# Patient Record
Sex: Female | Born: 1965 | Race: White | Hispanic: No | Marital: Single | Smoking: Current every day smoker
Health system: Southern US, Community
[De-identification: ages and names within clinical notes are randomized; demographics above are authoritative.]

## PROBLEM LIST (undated history)

## (undated) DIAGNOSIS — I2699 Other pulmonary embolism without acute cor pulmonale: Secondary | ICD-10-CM

## (undated) DIAGNOSIS — IMO0002 Reserved for concepts with insufficient information to code with codable children: Secondary | ICD-10-CM

## (undated) DIAGNOSIS — F319 Bipolar disorder, unspecified: Secondary | ICD-10-CM

## (undated) DIAGNOSIS — F329 Major depressive disorder, single episode, unspecified: Secondary | ICD-10-CM

## (undated) DIAGNOSIS — F419 Anxiety disorder, unspecified: Secondary | ICD-10-CM

## (undated) DIAGNOSIS — F1910 Other psychoactive substance abuse, uncomplicated: Secondary | ICD-10-CM

## (undated) DIAGNOSIS — M869 Osteomyelitis, unspecified: Secondary | ICD-10-CM

## (undated) DIAGNOSIS — G8929 Other chronic pain: Secondary | ICD-10-CM

## (undated) DIAGNOSIS — G709 Myoneural disorder, unspecified: Secondary | ICD-10-CM

## (undated) DIAGNOSIS — M329 Systemic lupus erythematosus, unspecified: Secondary | ICD-10-CM

## (undated) DIAGNOSIS — I10 Essential (primary) hypertension: Secondary | ICD-10-CM

## (undated) DIAGNOSIS — M1990 Unspecified osteoarthritis, unspecified site: Secondary | ICD-10-CM

## (undated) DIAGNOSIS — F32A Depression, unspecified: Secondary | ICD-10-CM

## (undated) DIAGNOSIS — R569 Unspecified convulsions: Secondary | ICD-10-CM

## (undated) DIAGNOSIS — E119 Type 2 diabetes mellitus without complications: Secondary | ICD-10-CM

## (undated) HISTORY — DX: Essential (primary) hypertension: I10

## (undated) HISTORY — DX: Systemic lupus erythematosus, unspecified: M32.9

## (undated) HISTORY — DX: Other pulmonary embolism without acute cor pulmonale: I26.99

## (undated) HISTORY — PX: OTHER SURGICAL HISTORY: SHX169

---

## 2005-12-20 ENCOUNTER — Inpatient Hospital Stay (HOSPITAL_COMMUNITY): Admission: EM | Admit: 2005-12-20 | Discharge: 2006-01-11 | Payer: Self-pay | Admitting: Emergency Medicine

## 2005-12-27 ENCOUNTER — Ambulatory Visit: Payer: Self-pay | Admitting: Critical Care Medicine

## 2006-01-04 ENCOUNTER — Ambulatory Visit: Payer: Self-pay | Admitting: Oncology

## 2006-01-13 ENCOUNTER — Ambulatory Visit (HOSPITAL_COMMUNITY): Admission: RE | Admit: 2006-01-13 | Discharge: 2006-01-13 | Payer: Self-pay | Admitting: Internal Medicine

## 2006-01-13 ENCOUNTER — Ambulatory Visit: Payer: Self-pay | Admitting: Internal Medicine

## 2006-01-17 ENCOUNTER — Ambulatory Visit: Payer: Self-pay | Admitting: Internal Medicine

## 2006-01-23 ENCOUNTER — Ambulatory Visit: Payer: Self-pay | Admitting: Internal Medicine

## 2006-01-27 ENCOUNTER — Ambulatory Visit: Payer: Self-pay | Admitting: Internal Medicine

## 2006-02-06 ENCOUNTER — Ambulatory Visit: Payer: Self-pay | Admitting: Internal Medicine

## 2006-02-06 ENCOUNTER — Ambulatory Visit: Payer: Self-pay | Admitting: *Deleted

## 2006-02-23 ENCOUNTER — Ambulatory Visit: Payer: Self-pay | Admitting: Internal Medicine

## 2006-03-09 ENCOUNTER — Ambulatory Visit: Payer: Self-pay | Admitting: Internal Medicine

## 2006-03-13 ENCOUNTER — Ambulatory Visit (HOSPITAL_COMMUNITY): Admission: RE | Admit: 2006-03-13 | Discharge: 2006-03-13 | Payer: Self-pay | Admitting: Internal Medicine

## 2006-03-24 ENCOUNTER — Ambulatory Visit: Payer: Self-pay | Admitting: Internal Medicine

## 2006-04-14 ENCOUNTER — Ambulatory Visit: Payer: Self-pay | Admitting: Internal Medicine

## 2006-05-19 ENCOUNTER — Ambulatory Visit: Payer: Self-pay | Admitting: Internal Medicine

## 2006-06-08 ENCOUNTER — Ambulatory Visit: Payer: Self-pay | Admitting: Family Medicine

## 2006-06-23 ENCOUNTER — Ambulatory Visit: Payer: Self-pay | Admitting: Internal Medicine

## 2006-06-30 ENCOUNTER — Ambulatory Visit (HOSPITAL_COMMUNITY): Admission: RE | Admit: 2006-06-30 | Discharge: 2006-06-30 | Payer: Self-pay | Admitting: Internal Medicine

## 2006-06-30 ENCOUNTER — Ambulatory Visit: Payer: Self-pay | Admitting: Internal Medicine

## 2006-09-05 ENCOUNTER — Ambulatory Visit: Payer: Self-pay | Admitting: Internal Medicine

## 2006-10-11 ENCOUNTER — Ambulatory Visit: Payer: Self-pay | Admitting: Internal Medicine

## 2006-11-21 DIAGNOSIS — I2699 Other pulmonary embolism without acute cor pulmonale: Secondary | ICD-10-CM

## 2006-11-21 HISTORY — DX: Other pulmonary embolism without acute cor pulmonale: I26.99

## 2006-11-24 ENCOUNTER — Ambulatory Visit: Payer: Self-pay | Admitting: Internal Medicine

## 2007-06-05 ENCOUNTER — Emergency Department (HOSPITAL_COMMUNITY): Admission: EM | Admit: 2007-06-05 | Discharge: 2007-06-05 | Payer: Self-pay | Admitting: Emergency Medicine

## 2007-08-08 ENCOUNTER — Encounter (INDEPENDENT_AMBULATORY_CARE_PROVIDER_SITE_OTHER): Payer: Self-pay | Admitting: *Deleted

## 2007-12-12 ENCOUNTER — Ambulatory Visit: Payer: Self-pay | Admitting: Internal Medicine

## 2007-12-17 ENCOUNTER — Ambulatory Visit: Payer: Self-pay | Admitting: Internal Medicine

## 2007-12-24 ENCOUNTER — Ambulatory Visit: Payer: Self-pay | Admitting: Internal Medicine

## 2007-12-31 ENCOUNTER — Ambulatory Visit: Payer: Self-pay | Admitting: Internal Medicine

## 2008-01-15 ENCOUNTER — Ambulatory Visit: Payer: Self-pay | Admitting: Internal Medicine

## 2008-01-17 ENCOUNTER — Ambulatory Visit: Payer: Self-pay | Admitting: Internal Medicine

## 2008-01-29 ENCOUNTER — Ambulatory Visit: Payer: Self-pay | Admitting: Internal Medicine

## 2008-02-12 ENCOUNTER — Ambulatory Visit: Payer: Self-pay | Admitting: Internal Medicine

## 2008-03-19 ENCOUNTER — Ambulatory Visit: Payer: Self-pay | Admitting: Internal Medicine

## 2010-04-02 ENCOUNTER — Encounter: Admission: RE | Admit: 2010-04-02 | Discharge: 2010-04-02 | Payer: Self-pay | Admitting: Orthopedic Surgery

## 2010-05-13 ENCOUNTER — Ambulatory Visit: Payer: Self-pay | Admitting: Oncology

## 2010-06-08 LAB — CBC WITH DIFFERENTIAL/PLATELET
BASO%: 0.5 % (ref 0.0–2.0)
Basophils Absolute: 0 10*3/uL (ref 0.0–0.1)
EOS%: 0.5 % (ref 0.0–7.0)
Eosinophils Absolute: 0 10*3/uL (ref 0.0–0.5)
HCT: 41.1 % (ref 34.8–46.6)
HGB: 14.4 g/dL (ref 11.6–15.9)
LYMPH%: 26.7 % (ref 14.0–49.7)
MCH: 29.2 pg (ref 25.1–34.0)
MCHC: 35 g/dL (ref 31.5–36.0)
MCV: 83.3 fL (ref 79.5–101.0)
MONO#: 0.9 10*3/uL (ref 0.1–0.9)
MONO%: 9.5 % (ref 0.0–14.0)
NEUT#: 6.1 10*3/uL (ref 1.5–6.5)
NEUT%: 62.8 % (ref 38.4–76.8)
Platelets: 284 10*3/uL (ref 145–400)
RBC: 4.93 10*6/uL (ref 3.70–5.45)
RDW: 16.7 % — ABNORMAL HIGH (ref 11.2–14.5)
WBC: 9.7 10*3/uL (ref 3.9–10.3)
lymph#: 2.6 10*3/uL (ref 0.9–3.3)

## 2010-06-08 LAB — APTT: aPTT: 38 seconds — ABNORMAL HIGH (ref 24–37)

## 2010-06-08 LAB — PROTHROMBIN TIME
INR: 1.83 — ABNORMAL HIGH (ref <1.50)
Prothrombin Time: 21 seconds — ABNORMAL HIGH (ref 11.6–15.2)

## 2010-06-14 LAB — COMPREHENSIVE METABOLIC PANEL
ALT: 20 U/L (ref 0–35)
AST: 17 U/L (ref 0–37)
Albumin: 4.1 g/dL (ref 3.5–5.2)
Alkaline Phosphatase: 75 U/L (ref 39–117)
BUN: 4 mg/dL — ABNORMAL LOW (ref 6–23)
CO2: 19 mEq/L (ref 19–32)
Calcium: 9.4 mg/dL (ref 8.4–10.5)
Chloride: 107 mEq/L (ref 96–112)
Creatinine, Ser: 0.93 mg/dL (ref 0.40–1.20)
Glucose, Bld: 89 mg/dL (ref 70–99)
Potassium: 4.4 mEq/L (ref 3.5–5.3)
Sodium: 141 mEq/L (ref 135–145)
Total Bilirubin: 0.4 mg/dL (ref 0.3–1.2)
Total Protein: 6.8 g/dL (ref 6.0–8.3)

## 2010-06-14 LAB — HYPERCOAGULABLE PANEL, COMPREHENSIVE
AntiThromb III Func: 108 % (ref 76–126)
Anticardiolipin IgA: 15 APL U/mL (ref <22)
Anticardiolipin IgG: 24 GPL U/mL — ABNORMAL HIGH (ref <23)
Anticardiolipin IgM: 18 MPL U/mL — ABNORMAL HIGH (ref <11)
Beta-2 Glyco I IgG: 79 G Units — ABNORMAL HIGH (ref <20)
Beta-2-Glycoprotein I IgA: 188 A Units — ABNORMAL HIGH (ref <20)
Beta-2-Glycoprotein I IgM: 13 M Units (ref <20)
DRVVT 1:1 Mix: 59.6 secs — ABNORMAL HIGH (ref 36.2–44.3)
DRVVT: 80.2 secs — ABNORMAL HIGH (ref 36.2–44.3)
Drvvt confirmation: 1.71 Ratio — ABNORMAL HIGH (ref <1.21)
Homocysteine: 8.8 umol/L (ref 4.0–15.4)
Lupus Anticoagulant: DETECTED — AB
PTT Lupus Anticoagulant: 45.8 secs — ABNORMAL HIGH (ref 30.0–45.6)
PTTLA 4:1 Mix: 46.4 secs — ABNORMAL HIGH (ref 30.0–45.6)
PTTLA Confirmation: 17.9 secs — ABNORMAL HIGH (ref <8.0)
Protein C Activity: 24 % — ABNORMAL LOW (ref 75–133)
Protein C, Total: 71 % (ref 70–140)
Protein S Activity: 34 % — ABNORMAL LOW (ref 69–129)
Protein S Ag, Total: 67 % — ABNORMAL LOW (ref 70–140)

## 2010-06-14 LAB — LACTATE DEHYDROGENASE: LDH: 172 U/L (ref 94–250)

## 2010-06-14 LAB — D-DIMER, QUANTITATIVE: D-Dimer, Quant: 0.22 ug/mL-FEU (ref 0.00–0.48)

## 2010-06-19 LAB — HEXAGONAL PHOSPHOLIPID NEUTRALIZATION: Hex Phosph Neut Test: POSITIVE — AB

## 2010-06-23 ENCOUNTER — Ambulatory Visit: Admission: RE | Admit: 2010-06-23 | Discharge: 2010-06-23 | Payer: Self-pay | Admitting: Oncology

## 2010-06-23 ENCOUNTER — Encounter (HOSPITAL_COMMUNITY): Payer: Self-pay | Admitting: Oncology

## 2010-06-23 ENCOUNTER — Ambulatory Visit: Payer: Self-pay | Admitting: Surgery

## 2010-06-24 ENCOUNTER — Ambulatory Visit: Payer: Self-pay | Admitting: Oncology

## 2010-08-12 ENCOUNTER — Ambulatory Visit: Payer: Self-pay | Admitting: Oncology

## 2010-08-16 LAB — PROTIME-INR
INR: 1.6 — ABNORMAL LOW (ref 2.00–3.50)
Protime: 19.2 Seconds — ABNORMAL HIGH (ref 10.6–13.4)

## 2010-08-23 LAB — PROTIME-INR
INR: 1.8 — ABNORMAL LOW (ref 2.00–3.50)
Protime: 21.6 Seconds — ABNORMAL HIGH (ref 10.6–13.4)

## 2010-09-06 ENCOUNTER — Inpatient Hospital Stay (HOSPITAL_COMMUNITY): Admission: AD | Admit: 2010-09-06 | Discharge: 2010-09-10 | Payer: Self-pay | Admitting: Orthopedic Surgery

## 2010-10-21 ENCOUNTER — Ambulatory Visit: Payer: Self-pay | Admitting: Oncology

## 2010-11-25 ENCOUNTER — Ambulatory Visit: Payer: Self-pay | Admitting: Oncology

## 2010-11-26 LAB — PROTIME-INR
INR: 3.5 (ref 2.00–3.50)
Protime: 42 Seconds — ABNORMAL HIGH (ref 10.6–13.4)

## 2010-12-06 LAB — PROTIME-INR
INR: 3.5 (ref 2.00–3.50)
Protime: 42 Seconds — ABNORMAL HIGH (ref 10.6–13.4)

## 2010-12-20 LAB — PROTIME-INR
INR: 1.9 — ABNORMAL LOW (ref 2.00–3.50)
Protime: 22.8 Seconds — ABNORMAL HIGH (ref 10.6–13.4)

## 2010-12-23 NOTE — Miscellaneous (Signed)
Summary: VIP  Patient: Crystal Frank Note: All result statuses are Final unless otherwise noted.  Tests: (1) VIP (Medications)   LLIMPORTMEDS              "Result Below..."       RESULT: WARFARIN SODIUM TABS 5 MG*TAKE 3 TABLETS EVERY MONDAY, WEDNESDAY, AND FRIDAY AND TAKE 2-1/2 TABLETS EVERY TUESDAY, THURSDAY, SATURDAY, SUNDAY*11/24/2006*Last Refill: 03/06/2007*[id number]*******   LLIMPORTALLS              NKDA***  Note: An exclamation mark (!) indicates a result that was not dispersed into the flowsheet. Document Creation Date: 09/20/2007 3:04 PM _______________________________________________________________________  (1) Order result status: Final Collection or observation date-time: 08/08/2007 Requested date-time: 08/08/2007 Receipt date-time:  Reported date-time: 08/08/2007 Referring Physician:   Ordering Physician:   Specimen Source:  Source: [facility] [place] Order Number:  Lab site:

## 2011-01-03 ENCOUNTER — Emergency Department (HOSPITAL_COMMUNITY): Payer: Self-pay

## 2011-01-03 ENCOUNTER — Emergency Department (HOSPITAL_COMMUNITY)
Admission: EM | Admit: 2011-01-03 | Discharge: 2011-01-03 | Disposition: A | Discharge status: Other Acute Inpatient Hospital | Payer: Self-pay | Attending: Emergency Medicine | Admitting: Emergency Medicine

## 2011-01-03 DIAGNOSIS — Z7901 Long term (current) use of anticoagulants: Secondary | ICD-10-CM | POA: Insufficient documentation

## 2011-01-03 DIAGNOSIS — R0602 Shortness of breath: Secondary | ICD-10-CM | POA: Insufficient documentation

## 2011-01-03 DIAGNOSIS — J40 Bronchitis, not specified as acute or chronic: Secondary | ICD-10-CM | POA: Insufficient documentation

## 2011-01-03 DIAGNOSIS — R079 Chest pain, unspecified: Secondary | ICD-10-CM | POA: Insufficient documentation

## 2011-01-03 DIAGNOSIS — Z86711 Personal history of pulmonary embolism: Secondary | ICD-10-CM | POA: Insufficient documentation

## 2011-01-03 DIAGNOSIS — M329 Systemic lupus erythematosus, unspecified: Secondary | ICD-10-CM | POA: Insufficient documentation

## 2011-01-03 LAB — POCT CARDIAC MARKERS
CKMB, poc: <1 ng/mL — ABNORMAL LOW (ref 1.0–8.0)
Myoglobin, poc: 38.5 ng/mL (ref 12–200)
Troponin i, poc: <0.05 ng/mL (ref 0.00–0.09)

## 2011-01-03 LAB — CBC
HCT: 42.5 % (ref 36.0–46.0)
Hemoglobin: 14.4 g/dL (ref 12.0–15.0)
MCH: 27.5 pg (ref 26.0–34.0)
MCHC: 33.9 g/dL (ref 30.0–36.0)
MCV: 81.3 fL (ref 78.0–100.0)
Platelets: 263 10*3/uL (ref 150–400)
RBC: 5.23 MIL/uL — ABNORMAL HIGH (ref 3.87–5.11)
RDW: 16.1 % — ABNORMAL HIGH (ref 11.5–15.5)
WBC: 15.6 10*3/uL — ABNORMAL HIGH (ref 4.0–10.5)

## 2011-01-03 LAB — DIFFERENTIAL
Basophils Absolute: 0.1 10*3/uL (ref 0.0–0.1)
Basophils Relative: 0 % (ref 0–1)
Eosinophils Absolute: 0.1 10*3/uL (ref 0.0–0.7)
Eosinophils Relative: 0 % (ref 0–5)
Lymphocytes Relative: 31 % (ref 12–46)
Lymphs Abs: 4.9 10*3/uL — ABNORMAL HIGH (ref 0.7–4.0)
Monocytes Absolute: 1.5 10*3/uL — ABNORMAL HIGH (ref 0.1–1.0)
Monocytes Relative: 10 % (ref 3–12)
Neutro Abs: 9.1 10*3/uL — ABNORMAL HIGH (ref 1.7–7.7)
Neutrophils Relative %: 59 % (ref 43–77)

## 2011-01-03 LAB — POCT I-STAT, CHEM 8
BUN: 6 mg/dL (ref 6–23)
Calcium, Ion: 1.13 mmol/L (ref 1.12–1.32)
Chloride: 107 mEq/L (ref 96–112)
Creatinine, Ser: 1 mg/dL (ref 0.4–1.2)
Glucose, Bld: 91 mg/dL (ref 70–99)
HCT: 43 % (ref 36.0–46.0)
Hemoglobin: 14.6 g/dL (ref 12.0–15.0)
Potassium: 5 mEq/L (ref 3.5–5.1)
Sodium: 141 mEq/L (ref 135–145)
TCO2: 26 mmol/L (ref 0–100)

## 2011-01-03 LAB — PROTIME-INR
INR: 3.01 — ABNORMAL HIGH (ref 0.00–1.49)
Prothrombin Time: 31.3 seconds — ABNORMAL HIGH (ref 11.6–15.2)

## 2011-01-03 MED ORDER — IOHEXOL 300 MG/ML  SOLN
80.0000 mL | Freq: Once | INTRAMUSCULAR | Status: AC | PRN
  Administered 2011-01-03: 80 mL via INTRAVENOUS

## 2011-01-18 ENCOUNTER — Other Ambulatory Visit (HOSPITAL_COMMUNITY): Payer: Self-pay | Admitting: Oncology

## 2011-01-18 ENCOUNTER — Encounter (HOSPITAL_BASED_OUTPATIENT_CLINIC_OR_DEPARTMENT_OTHER): Payer: Self-pay | Admitting: Oncology

## 2011-01-18 DIAGNOSIS — Z7901 Long term (current) use of anticoagulants: Secondary | ICD-10-CM

## 2011-01-18 DIAGNOSIS — D6859 Other primary thrombophilia: Secondary | ICD-10-CM

## 2011-01-18 DIAGNOSIS — Z86718 Personal history of other venous thrombosis and embolism: Secondary | ICD-10-CM

## 2011-01-18 LAB — PROTIME-INR
INR: 2.2 (ref 2.00–3.50)
Protime: 26.4 Seconds — ABNORMAL HIGH (ref 10.6–13.4)

## 2011-02-02 LAB — COMPREHENSIVE METABOLIC PANEL
ALT: 117 U/L — ABNORMAL HIGH (ref 0–35)
ALT: 85 U/L — ABNORMAL HIGH (ref 0–35)
AST: 45 U/L — ABNORMAL HIGH (ref 0–37)
AST: 66 U/L — ABNORMAL HIGH (ref 0–37)
Albumin: 3.4 g/dL — ABNORMAL LOW (ref 3.5–5.2)
Albumin: 3.9 g/dL (ref 3.5–5.2)
Alkaline Phosphatase: 69 U/L (ref 39–117)
Alkaline Phosphatase: 69 U/L (ref 39–117)
BUN: 3 mg/dL — ABNORMAL LOW (ref 6–23)
BUN: 4 mg/dL — ABNORMAL LOW (ref 6–23)
CO2: 25 mEq/L (ref 19–32)
CO2: 31 mEq/L (ref 19–32)
Calcium: 8.8 mg/dL (ref 8.4–10.5)
Calcium: 9 mg/dL (ref 8.4–10.5)
Chloride: 104 mEq/L (ref 96–112)
Chloride: 109 mEq/L (ref 96–112)
Creatinine, Ser: 0.76 mg/dL (ref 0.4–1.2)
Creatinine, Ser: 0.8 mg/dL (ref 0.4–1.2)
GFR calc Af Amer: >60 mL/min (ref >60)
GFR calc Af Amer: >60 mL/min (ref >60)
GFR calc non Af Amer: >60 mL/min (ref >60)
GFR calc non Af Amer: >60 mL/min (ref >60)
Glucose, Bld: 106 mg/dL — ABNORMAL HIGH (ref 70–99)
Glucose, Bld: 78 mg/dL (ref 70–99)
Potassium: 3.4 mEq/L — ABNORMAL LOW (ref 3.5–5.1)
Potassium: 3.9 mEq/L (ref 3.5–5.1)
Sodium: 140 mEq/L (ref 135–145)
Sodium: 141 mEq/L (ref 135–145)
Total Bilirubin: 1 mg/dL (ref 0.3–1.2)
Total Bilirubin: 1.1 mg/dL (ref 0.3–1.2)
Total Protein: 6.1 g/dL (ref 6.0–8.3)
Total Protein: 6.8 g/dL (ref 6.0–8.3)

## 2011-02-02 LAB — CBC
HCT: 34.4 % — ABNORMAL LOW (ref 36.0–46.0)
HCT: 35.5 % — ABNORMAL LOW (ref 36.0–46.0)
HCT: 36.2 % (ref 36.0–46.0)
HCT: 38.1 % (ref 36.0–46.0)
Hemoglobin: 11.5 g/dL — ABNORMAL LOW (ref 12.0–15.0)
Hemoglobin: 11.9 g/dL — ABNORMAL LOW (ref 12.0–15.0)
Hemoglobin: 12.1 g/dL (ref 12.0–15.0)
Hemoglobin: 13 g/dL (ref 12.0–15.0)
MCH: 28.5 pg (ref 26.0–34.0)
MCH: 28.5 pg (ref 26.0–34.0)
MCH: 28.7 pg (ref 26.0–34.0)
MCH: 28.8 pg (ref 26.0–34.0)
MCHC: 33.4 g/dL (ref 30.0–36.0)
MCHC: 33.4 g/dL (ref 30.0–36.0)
MCHC: 33.5 g/dL (ref 30.0–36.0)
MCHC: 34.1 g/dL (ref 30.0–36.0)
MCV: 84.1 fL (ref 78.0–100.0)
MCV: 84.9 fL (ref 78.0–100.0)
MCV: 85.4 fL (ref 78.0–100.0)
MCV: 86.2 fL (ref 78.0–100.0)
Platelets: 233 10*3/uL (ref 150–400)
Platelets: 241 10*3/uL (ref 150–400)
Platelets: 249 10*3/uL (ref 150–400)
Platelets: 250 10*3/uL (ref 150–400)
RBC: 4.03 MIL/uL (ref 3.87–5.11)
RBC: 4.18 MIL/uL (ref 3.87–5.11)
RBC: 4.2 MIL/uL (ref 3.87–5.11)
RBC: 4.53 MIL/uL (ref 3.87–5.11)
RDW: 14.6 % (ref 11.5–15.5)
RDW: 14.7 % (ref 11.5–15.5)
RDW: 14.9 % (ref 11.5–15.5)
RDW: 15.2 % (ref 11.5–15.5)
WBC: 12.3 10*3/uL — ABNORMAL HIGH (ref 4.0–10.5)
WBC: 8.6 10*3/uL (ref 4.0–10.5)
WBC: 8.7 10*3/uL (ref 4.0–10.5)
WBC: 8.8 10*3/uL (ref 4.0–10.5)

## 2011-02-02 LAB — URINALYSIS, ROUTINE W REFLEX MICROSCOPIC
Bilirubin Urine: NEGATIVE
Glucose, UA: NEGATIVE mg/dL
Hgb urine dipstick: NEGATIVE
Ketones, ur: NEGATIVE mg/dL
Nitrite: NEGATIVE
Protein, ur: NEGATIVE mg/dL
Specific Gravity, Urine: 1.024 (ref 1.005–1.030)
Urobilinogen, UA: 1 mg/dL (ref 0.0–1.0)
pH: 5.5 (ref 5.0–8.0)

## 2011-02-02 LAB — DIFFERENTIAL
Basophils Absolute: 0 10*3/uL (ref 0.0–0.1)
Basophils Relative: 1 % (ref 0–1)
Eosinophils Absolute: 0.1 10*3/uL (ref 0.0–0.7)
Eosinophils Relative: 1 % (ref 0–5)
Lymphocytes Relative: 33 % (ref 12–46)
Lymphs Abs: 2.8 10*3/uL (ref 0.7–4.0)
Monocytes Absolute: 0.8 10*3/uL (ref 0.1–1.0)
Monocytes Relative: 10 % (ref 3–12)
Neutro Abs: 4.8 10*3/uL (ref 1.7–7.7)
Neutrophils Relative %: 56 % (ref 43–77)

## 2011-02-02 LAB — URINE MICROSCOPIC-ADD ON

## 2011-02-02 LAB — HEPARIN LEVEL (UNFRACTIONATED)
Heparin Unfractionated: 0.19 IU/mL — ABNORMAL LOW (ref 0.30–0.70)
Heparin Unfractionated: 0.27 IU/mL — ABNORMAL LOW (ref 0.30–0.70)
Heparin Unfractionated: 0.34 IU/mL (ref 0.30–0.70)
Heparin Unfractionated: 0.36 IU/mL (ref 0.30–0.70)
Heparin Unfractionated: 0.41 IU/mL (ref 0.30–0.70)
Heparin Unfractionated: 0.44 IU/mL (ref 0.30–0.70)

## 2011-02-02 LAB — PROTIME-INR
INR: 1.02 (ref 0.00–1.49)
INR: 1.07 (ref 0.00–1.49)
INR: 1.23 (ref 0.00–1.49)
INR: 1.53 — ABNORMAL HIGH (ref 0.00–1.49)
Prothrombin Time: 13.6 seconds (ref 11.6–15.2)
Prothrombin Time: 14.1 seconds (ref 11.6–15.2)
Prothrombin Time: 15.7 seconds — ABNORMAL HIGH (ref 11.6–15.2)
Prothrombin Time: 18.6 seconds — ABNORMAL HIGH (ref 11.6–15.2)

## 2011-02-02 LAB — URINE CULTURE
Colony Count: 35000
Culture  Setup Time: 201110180549

## 2011-02-02 LAB — VITAMIN D 25 HYDROXY (VIT D DEFICIENCY, FRACTURES): Vit D, 25-Hydroxy: 30 ng/mL (ref 30–89)

## 2011-02-02 LAB — CALCIUM, IONIZED: Calcium, Ion: 1.22 mmol/L (ref 1.12–1.32)

## 2011-02-02 LAB — VITAMIN D 1,25 DIHYDROXY
Vitamin D 1, 25 (OH)2 Total: 31 pg/mL (ref 18–72)
Vitamin D2 1, 25 (OH)2: <8 pg/mL
Vitamin D3 1, 25 (OH)2: 31 pg/mL

## 2011-02-02 LAB — PTH, INTACT AND CALCIUM
Calcium, Total (PTH): 8.6 mg/dL (ref 8.4–10.5)
PTH: 20.8 pg/mL (ref 14.0–72.0)

## 2011-02-02 LAB — APTT: aPTT: 45 seconds — ABNORMAL HIGH (ref 24–37)

## 2011-02-17 ENCOUNTER — Encounter (HOSPITAL_BASED_OUTPATIENT_CLINIC_OR_DEPARTMENT_OTHER): Payer: Self-pay | Admitting: Oncology

## 2011-02-17 ENCOUNTER — Other Ambulatory Visit (HOSPITAL_COMMUNITY): Payer: Self-pay | Admitting: Oncology

## 2011-02-17 DIAGNOSIS — Z7901 Long term (current) use of anticoagulants: Secondary | ICD-10-CM

## 2011-02-17 DIAGNOSIS — D6859 Other primary thrombophilia: Secondary | ICD-10-CM

## 2011-02-17 DIAGNOSIS — Z86718 Personal history of other venous thrombosis and embolism: Secondary | ICD-10-CM

## 2011-02-17 LAB — PROTIME-INR
INR: 1.6 — ABNORMAL LOW (ref 2.00–3.50)
Protime: 19.2 Seconds — ABNORMAL HIGH (ref 10.6–13.4)

## 2011-03-15 ENCOUNTER — Other Ambulatory Visit: Payer: Self-pay | Admitting: Orthopedic Surgery

## 2011-03-21 ENCOUNTER — Other Ambulatory Visit: Payer: Self-pay | Admitting: Orthopedic Surgery

## 2011-03-21 ENCOUNTER — Ambulatory Visit
Admission: RE | Admit: 2011-03-21 | Discharge: 2011-03-21 | Disposition: A | Discharge status: Home / Self Care | Payer: Worker's Compensation | Source: Ambulatory Visit | Attending: Orthopedic Surgery | Admitting: Orthopedic Surgery

## 2011-03-21 DIAGNOSIS — M25579 Pain in unspecified ankle and joints of unspecified foot: Secondary | ICD-10-CM

## 2011-03-22 ENCOUNTER — Inpatient Hospital Stay
Admission: RE | Admit: 2011-03-22 | Discharge: 2011-03-22 | Disposition: A | Discharge status: Home / Self Care | Payer: Worker's Compensation | Source: Ambulatory Visit | Attending: Orthopedic Surgery | Admitting: Orthopedic Surgery

## 2011-03-22 ENCOUNTER — Other Ambulatory Visit: Payer: Worker's Compensation

## 2011-03-22 ENCOUNTER — Other Ambulatory Visit: Payer: Self-pay | Admitting: Orthopedic Surgery

## 2011-03-22 ENCOUNTER — Ambulatory Visit
Admission: RE | Admit: 2011-03-22 | Discharge: 2011-03-22 | Disposition: A | Discharge status: Home / Self Care | Payer: Worker's Compensation | Source: Ambulatory Visit | Attending: Orthopedic Surgery | Admitting: Orthopedic Surgery

## 2011-03-22 DIAGNOSIS — M25579 Pain in unspecified ankle and joints of unspecified foot: Secondary | ICD-10-CM

## 2011-04-08 NOTE — H&P (Signed)
NAMESHONTELL, PROSSER               ACCOUNT NO.:  [id number]   MEDICAL RECORD NO.:  [id number]          PATIENT TYPE:  EMS   LOCATION:  ED                           FACILITY:  [facility]   PHYSICIAN:  Hettie Holstein, D.O.    DATE OF BIRTH:  1966-06-01   DATE OF ADMISSION:  12/20/2005  DATE OF DISCHARGE:                                HISTORY & PHYSICAL   PRIMARY CARE PHYSICIAN:  Mosetta Putt, M.D.   CHIEF COMPLAINT:  Chest pain.   HISTORY OF PRESENT ILLNESS:  Mrs. Bricco is a pleasant, relatively healthy 45-  year-old Caucasian female with rather unremarkable past medical history with  the exception of previous history of pleurisy several years ago without  known history of PE.  She had seen a pulmonologist in [place] at that  time.  In any event, about a week ago she developed some pain in her neck on  the rt side.  This had persisted and eventually progressed until Monday with  involvement of the right shoulder and right chest.  She took about four  ibuprofen and this seemed to help initially.  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 this  continued to persist without relief.  She became very short of breath.  She  presented to the emergency department at [facility]. She stated  the pain was quite severe and she underwent CT scanning in the emergency  department which revealed positive right upper lobe pulmonary embolus and  she is currently on a heparin drip.  A hypercoagulable panel has been  ordered subsequently.   PAST MEDICAL HISTORY:  As noted above.  She had been treated for  hypertension though she has been well controlled off medications and she had  pleurisy that was addressed by a pulmonologist in [place].  She denies  any prior history of surgery.   MEDICATIONS:  She takes no routine medications at home.   ALLERGIES:  She  has no known drug allergies.   SOCIAL HISTORY:  She has her own [employer].  She smokes  about one-half pack of cigarettes per day. She denies alcohol.  She is  single, lives alone. She has no children.  She denies illicit drug use.   FAMILY HISTORY:  She has an aunt on her mother's side who had a deep venous  thrombosis.  Her mother died at age 64 with human immunodeficiency virus and  cancer.  Father is alive and well at age 77.   REVIEW OF SYSTEMS:  She has been in her usual state of health with no weight  loss or appetite loss. No nausea, vomiting, diarrhea, no abdominal pain,  swelling.  She does report some occasional discomfort in her left lower  extremity though she states that she does not recall much and has just  dismissed this in the past.   PHYSICAL EXAMINATION:  VITAL SIGNS:  Blood pressure is 135/64, heart rate  92, respiratory rate 18, temperature 97.1, oxygen  saturation 96%.  GENERAL APPEARANCE: The patient is alert, oriented, very pleasant in no  acute distress.  HEENT:  Reveals her head to be normocephalic, atraumatic.  Extraocular  movements intact.  NECK:  Supple, nontender.  No palpable thyromegaly or masses.  CARDIOVASCULAR:  Examination reveals normal S1, S2.  No S3 or S4.  LUNGS:  Reveal diminished sounds, though clear bilaterally.  She exhibits no  excess effort in respirations.  She has no dullness to percussion.  ABDOMEN:  Soft, nontender.  EXTREMITIES:  Lower extremities reveal no calf tenderness.  She exhibits no  edema.  NEUROLOGICAL:  Examination reveals her to be alert, euthymic.  Her affect is  stable.  There are no focal neurological deficits.   LABORATORY DATA:  Sodium 140, potassium 4.0, BUN 8, creatinine 0.8 and her  glucose was 106.  Her D-dimer was 0.98.  Initial point of care markers  negative.  CT scan as mentioned above.   ASSESSMENT:  1.  Acute pulmonary embolus, spontaneous.  2.  Tobacco dependence.  3.  History of  pleurisy.  4.  History of hypertension.   PLAN:  At this time we are going to admit Ms. Mclaren to Med-Surg floor, place  her on continuous pulse oximetry.  Heparin drip has been initiated and she  will be started on Coumadin and receive instructions.  A hypercoagulable  panel has been ordered in the emergency department and is in the process of  being drawn.      Hettie Holstein, D.O.  Electronically Signed     ESS/MEDQ  D:  12/20/2005  T:  12/20/2005  Job:  [id number]   cc:   Mosetta Putt, M.D.  Fax: [phone]

## 2011-04-08 NOTE — Consult Note (Signed)
NAMEAVNOOR, KOURY               ACCOUNT NO.:  [id number]   MEDICAL RECORD NO.:  [id number]          PATIENT TYPE:  INP   LOCATION:  1411                         FACILITY:  [facility]   PHYSICIAN:  Firas N. Shadad        DATE OF BIRTH:  [birth year]   DATE OF CONSULTATION:  01/04/2006  DATE OF DISCHARGE:                                   CONSULTATION   REASON FOR CONSULTATION:  Pulmonary embolus, thrombocytopenia and a positive  lupus anticoagulant.   HISTORY OF PRESENT ILLNESS:  This is a very pleasant 45 year old female with  really no significant past medical history. She is a native of [place]  and has been on a really diet controlled hypertension. She has a history of  obesity but no other chronic medical illnesses. The patient denied using  oral contraceptives. Also denied any incidences of immobilization over the  last three to four months. The patient presented to the [facility]  Emergency Department on December 20, 2005 with complaints of shortness of  breath and right-sided pleuritic chest pain. The patient had a CT scan  obtained at that time which showed an extensive pulmonary thromboembolism in  the right lower lobe with associated right lower lobe airspace disease that  was thought to be a pulmonary infarct versus a pneumonia. The patient was  admitted to the hospitalist service and had a prolonged hospital course  including a possible stay in the intensive care unit for a developing of a  right-sided parapneumonic loculated pleural effusion. The patient had an  ultrasound-guided paracentesis which was sent for cultures which was  essentially normal. The patient was supposed to get a VATs on pleurodesis;  however, because her condition clinically improved, that was fore gone. The  patient had a hypercoagulable workup that included a lupus anticoagulant  that returned positive on the time of diagnosis; however, I am not sure  whether the rest of her  hypercoagulable workup was obtained. Subsequently,  the patient was treated with heparin and Coumadin. Coumadin was stopped on  and off depending on the anticipation of a procedure but currently she has  been on it taking 7.5 milligrams every night and her INR was 1.3. It was  also noted that she developed thrombocytopenia initially on December 30, 2005. Her initial platelet count was 252,000, went down to 366,000 and it  dropped on February 9th to 126,000 and it was 138,000 then 106,000, then  89,000 and today is 84,000. The patient did not have any bleeding symptoms,  did not have any petechial rash, did not have any epistaxis, although she  has had some blood-tinged mucus after she has been on oxygen. Has not had  any hematochezia or melena. Her HIT screen has been negative. As mentioned  in the last few days, clinically has continued to improve. She is  ambulating. She is off oxygen and very close to being sent home.   PAST MEDICAL HISTORY:  1.  Obesity.  2.  Hypertension.  3.  History of pleurisy.   PAST SURGICAL HISTORY:  No prior surgeries.  MEDICATIONS:  She was taking none at home but is currently on Celebrex,  Valium, Avelox, nicotine patch, Protonix, Tramadol, Warfarin, acetaminophen,  Dilaudid, Xopenex, Ambien, Benadryl.   ALLERGIES:  Allergic to [facility].   FAMILY HISTORY:  She denied any history of blood disorders or  hypercoagulability. She does not have any personal history of previous blood  clots. No family history of any DVT's, PE's, strokes, heart attacks or  phlebitis.   SOCIAL HISTORY:  She is single and lives alone. She has her own cleaning  business. She smokes at least one-half a pack per day. Denies any alcohol or  substance abuse.   REVIEW OF SYSTEMS:  She did not report any headaches, blurred vision or  double vision. Did not report any fevers, chills, night sweats, or appetite  changes. Did report some minor weight loss while she was in the  hospital.  She did not report any chest pain. Does have some exertional dyspnea,  shortness of breath, and occasional cough. Did not report hemoptysis,  hematemesis, hematochezia, or melena. Did not report any nausea, vomiting or  abdominal pain. Did not report any heat or cold intolerance. No bleeding  diaphysis.   PHYSICAL EXAMINATION:  An alert, awake female not in any distress. Very  pleasant. Her blood pressure is 116/78, pulse is 90, respirations 20,  temperature is 96.4. She is breathing 96% on room air. Head is normocephalic  and atraumatic. Pupils are equal, round and reactive to light. Mucus  membrane moist and pink. Neck is supple. No lymphadenopathy. Heart has  regular rate and rhythm. S1 and S2. Lungs are clear to auscultation. She has  some right-sided crackles and she has decreased breath sounds but no  wheezing that I could appreciate. She has good inspiratory effort. Abdomen  is soft and nontender. No hepatosplenomegaly. No rebound or guarding.  Extremities have no clubbing, cyanosis, or edema. Neurologically intact.  Skin had no rashes or wheezes.   Her blood work today showed hemoglobin of 9.9, white cells of 6.6, platelet  count of 84,000. INR is 1.3. Heparin level 0.64. Her chemistry include a  creatinine of 0.9, chloride of 107, potassium 4.5 and sodium of 138.   ASSESSMENT/PLAN:  1.  This is a very pleasant 45 year old female with the following issues;      right-sided pulmonary embolus with a pulmonary infarct. Loculated right-      sided pleural effusion.  2.  Positive lupus anticoagulant.  3.  Thrombocytopenia.  4.  Leukocytosis.   RECOMMENDATIONS:  1.  My recommendation and plan will be because of her pulmonary embolus, I      would recommend anticoagulation with Coumadin with a goal INR of 2.3 for      at least six months and recommend checking a hypercoagulable workup at     this point including genetic workup of factor-5 Leiden, prothrombin gene       mutation and phospholipid. Family of antibodies including      anticardiolipin and glycoprotein IgG and IgM. Also would recommend      homocystine level check and repeating the antiphospholipid antibody. It      has been a recent tendency for a false positive lupus anticoagulant at      this point. It is certain possible that she has a hypercoagulable state      causing her pulmonary embolus. She could possibly have a factor-5 Leiden      plus her minor obesity and smoking have provoked this clot.  2.  Thrombocytopenia: It is unclear whether this is a direct heparin-induced      thrombocytopenia. A screening test that has a very good negative      predictive value is negative which makes it less likely, however, not      certainly impossible. So, what I would recommend from that standpoint is      discontinuing any antibiotic that is      unnecessary and also would get her off the heparin as soon as possible      including and initiating Coumadin which would be adequate for      anticoagulation. If she indeed does not have heparin-induced antibody, I      think Lovenox is an adequate projected therapy.           ______________________________  Blenda Nicely. [facility]  Electronically Signed     FNS/MEDQ  D:  01/04/2006  T:  01/05/2006  Job:  [id number]   cc:   Mosetta Putt, M.D.  Fax: [phone]   Jonna L. Robb Matar, M.D.   Salvatore Decent. Cornelius Moras, M.D.  [address]  [place]  [place] [address]

## 2011-04-08 NOTE — Consult Note (Signed)
Crystal Frank, Crystal Frank               ACCOUNT NO.:  [id number]   MEDICAL RECORD NO.:  [id number]          PATIENT TYPE:  INP   LOCATION:  1411                         FACILITY:  [facility]   PHYSICIAN:  Salvatore Decent. Cornelius Moras, M.D. DATE OF BIRTH:  1966-10-30   DATE OF CONSULTATION:  01/01/2006  DATE OF DISCHARGE:                                   CONSULTATION   REASON FOR CONSULTATION:  Loculated right pleural effusion.   REFERRING PHYSICIAN:  Coralyn Helling, M.D.   HISTORY OF PRESENT ILLNESS:  The patient is a 45 year old, obese, Caucasian  female with history of tobacco abuse and hypertension.  The patient states  she was in her usual state of relatively good health until approximately one  week prior to admission when the patient first developed some pain on the  right side of her neck.  This progressed to the development of pain  involving the right shoulder and right chest.  By January 30, she developed  severe right-sided chest pain associated with severe shortness of breath.  She presented to the emergency department at [facility]  where she underwent a chest CT scan demonstrating a large pulmonary embolus.  Associated with this was some right lower lobe consolidation, possible  pulmonary infarction, possible pneumonia, and a small parapneumonic pleural  effusion.  The patient was admitted to the hospital and treated with empiric  IV antibiotics as well as anticoagulation with heparin and Coumadin.  Her  chest pain improved considerably.  She has had some low grade fevers which  improved.  She has had mild leukocytosis the entire hospitalization with  white blood count ranging from 14,300 at the time of admission to as high as  23,000 on February 1, and back down to 10,000 on February 8, back up again  to 15,000 today.  The patient has elevated sedimentation rate and apparently  she has been documented as having lupus anticoagulant suspicious for  possible underlying  hypercoagulable state.  Due to persistent right pleural  effusion, she underwent attempted ultrasound-guided needle aspiration  thoracentesis on February 9.  On initial examination, the interpreting  radiologist commented that only a very small amount of fluid was  appreciated.  However, after being encouraged by the clinical team, the  patient underwent thoracentesis of 1 mL of bloody fluid from the loculated  lateral component.  This fluid was sent for culture and Gram stain but no  other fluid laboratory studies were obtained.  Yesterday the patient had a  followup chest CT scan that revealed persistence of small to moderate  loculated right-sided pleural effusion with persistent pulmonary embolus  with obvious clot in a pulmonary artery and some mild pulmonary parenchymal  opacity consistent with infarction or pneumonia.  Cardiothoracic surgical  consultation was requested.   REVIEW OF SYSTEMS:  Review of systems has been documented in the patient's  chart and has been reviewed.  At present, the patient describes exertional  shortness of breath that has improved from the time of hospital admission.  The patient denies fevers.  The patient denies pleuritic chest pain.  The  patient is otherwise feeling fairly well.  The patient has had intermittent  dry cough and now reports cough productive of a small amount of phlegm.  The  patient denies hemoptysis or significant purulent sputum production.   PAST MEDICAL HISTORY:  Notable for an episode of pleurisy several years  ago on the left side at which time the patient was treated in [place].  The patient never underwent any sort of surgical procedure or aspiration  biopsy.  The patient denies any previous history of pulmonary embolus,  pneumonia, or other respiratory problems.   PAST SURGICAL HISTORY:  None.   MEDICATIONS PRIOR TO ADMISSION:  None.   ALLERGIES:  None known.   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  She smokes at least one-half pack of  cigarettes per day.  She denies alcohol.  She denies significant drug use.   PHYSICAL EXAMINATION:  GENERAL:  The patient is a well-appearing, obese  female who appears her stated age in no acute distress.  VITAL SIGNS:  She is afebrile with oxygen saturation maintaining greater  than 90% on room air.  HEENT:  Grossly unremarkable.  NECK:  Supple.  There is no cervical or supraclavicular lymphadenopathy.  CHEST:  Auscultation of the chest reveals somewhat diminished breast sounds  on the right side although breath sounds can be appreciated.  No wheezes or  rhonchi are noted.  There is no tenderness on palpation on the right chest.  ABDOMEN:  The abdomen is obese but soft, nontender.  EXTREMITIES:  Warm and adequately perfused.  There is no lower extremity  edema.   The remainder of her physical exam was noncontributory.   DIAGNOSTIC TESTS:  Chest CT scan performed yesterday is reviewed.  This  demonstrated small to moderate loculated right pleural effusion with mild  pulmonary parenchymal opacity and obvious pulmonary embolus.  There is no  significant pleural thickening or other stigmata of acute phase empyema.  The amount of pleural effusion fluid is not great although by the same token  it is not trivial.   IMPRESSION:  Small to moderate loculated right pleural effusion in the  setting of acute pulmonary embolus, possibly with associated community-  acquired pneumonia.  Pleural fluid culture remains negative at this time.  Given the recent acute pulmonary embolus, I do not feel that the potential  benefits of surgical drainage of the patient's loculated pleural fluid are  worth the associated risk.  The amount of loculated pleural fluid is not  trivial, but nonetheless relatively small.  In the absence of empyema this  fluid will likely resorb uneventfully.  RECOMMENDATIONS:  I recommend  continued medical therapy with intravenous  antibiotics and intravenous heparin as well as aggressive pulmonary  physiotherapy.  I would obtain repeat chest CT scan in three or four days or  sooner if the patient's clinical condition were to deteriorate.  If she  continues to do well clinically, it may be that nothing will need to be done  and ultimately her pleural effusions will likely be resorbed uneventfully.  If the pleural effusions enlarge but the patient otherwise remains  clinically stable,  another percutaneous drainage using CT or ultrasound  guidance would probably be appropriate, particularly to address the  loculated lateral component of fluid.  CT-guided drain placement (pigtail  catheter) might be the best course of therapy.  On the other hand, if the  patient develops clear clinical signs or  symptoms of empyema, she may  ultimately require surgical intervention video-assisted thoracoscopic  drainage or possible thoracotomy.  Unfortunately, this would be at  considerable risk given the patient's history of recent acute pulmonary  embolus.  I would consider surgery only the last option if she fails medical  therapy.      Salvatore Decent. Cornelius Moras, M.D.  Electronically Signed     CHO/MEDQ  D:  01/01/2006  T:  01/02/2006  Job:  [id number]

## 2011-04-08 NOTE — Discharge Summary (Signed)
Crystal Frank, Crystal Frank               ACCOUNT NO.:  [id number]   MEDICAL RECORD NO.:  [id number]          PATIENT TYPE:  INP   LOCATION:  1411                         FACILITY:  [facility]   PHYSICIAN:  Nelma Rothman, MD   DATE OF BIRTH:  1966/04/17   DATE OF ADMISSION:  12/20/2005  DATE OF DISCHARGE:                                 DISCHARGE SUMMARY   Please note that this is an addendum to previously-dictated discharge  summary by Dr. Angelena Sole on January 08, 2006. Please also see multiple  consultation notes dated February 6, February 11, and February 14.   PRIMARY CARE PHYSICIAN:  Dr. Reche Dixon at [facility]   PRINCIPAL DISCHARGE DIAGNOSES:  1.  Acute pulmonary embolus.  2.  Pleural effusion secondary to pulmonary embolus.  3.  Community-acquired pneumonia in the context of pulmonary infarction.  4.  Inpatient thrombocytopenia, resolved.   For procedures and laboratory data from this hospitalization, please see  previously-dictated discharge summary by Dr. Angelena Sole on February 19. However,  on the morning of discharge, her INR was 2.5. Hemoglobin 10.1, hematocrit  29.3, and platelet count 198.   HOSPITAL COURSE:  Again, please see previously-dictated discharge summary by  Dr. Angelena Sole for details, but briefly, Crystal Frank is a pleasant 45 year old lady  who was admitted with acute pulmonary embolus. Her hospital course was  complicated by development of a right-sided pleural effusion secondary to  pulmonary infarction, as well as pneumonia. She also developed inpatient  thrombocytopenia, which has since resolved. Tentative discharge had been  scheduled for February 19; however, that morning her INR was subtherapeutic  at 1.9 so she remained in the hospital for an additional 48 hours on Lovenox  therapy while attempting to reach a therapeutic INR. The day prior to  discharge her INR was 2.2. In an ideal situation, we would prefer to overlap  heparin therapy with the Coumadin for  approximately 48 hours and so on  the  morning of February 21 when her INR was 2.5, she was felt appropriate for  discharge. She is feeling much better than on admission; however, is anxious  to get home. She has remained afebrile for the previous 72 hours and is  completely hemodynamically stable. Oxygen saturation is 99% on room air.  Please note that it took quite some time to achieve a therapeutic INR,  requiring large doses of Coumadin. She received 10 mg daily for quite some  time before increasing this dose to 12.5 mg daily for approximately 4 days,  at which time it was then increased to 15 mg daily for the past couple of  days. Now that her INR is therapeutic, so as not to overshoot, we will  decrease her dose to 12.5 mg daily. Close outpatient follow-up will be  imperative, given her high doses of Coumadin and difficulty in achieving a  therapeutic INR. For this reason, we will ensure that she has an appointment  scheduled for Friday, February 23 - which is 2 days from now - prior to her  leaving the hospital. She will be discharged on Coumadin 12.5 mg daily until  follow-up in 2 days.   With regard to her pleural effusion, she will need to have a follow-up chest  x-ray ordered next week as an outpatient by her outpatient physician. This  is to ensure resolution of her pleural effusion which was felt secondary to  pulmonary infarction. She will also complete an additional 5 days of Avelox  for pneumonia in the setting of pulmonary infarction.   Finally, she was noted to have a positive lupus anticoagulant. It is true  that this can be a false positive in the setting of an acute clot.  Nevertheless, per hematology, she should have this repeated at least 6 weeks  from now, possibly at the end of 6 months. If this should remain positive at  that time, this would confirm the presence of an antiphospholipid antibody.   DISCHARGE MEDICATIONS:  1.  Coumadin - she has been dispensed 5  mg tablets and instructed to take      two-and-a-half tablets or 12.5 mg daily until instructed otherwise by      her outpatient physician.  2.  Avelox 400 mg p.o. daily x5 additional days.   DISCHARGE INSTRUCTIONS:  She will follow up with Dr. Reche Dixon at [facility]  on Friday, January 13, 2006, for check of her INR. She will also need to  have a repeat chest x-ray ordered at that time for the following week to  ensure resolution of the right-sided pleural effusion. She also has an  appointment scheduled with Dr. Duaine Dredge who used to be her primary care  physician, scheduled at this time for tomorrow - Thursday, January 12, 2006, at 4 p.m. However, the patient would prefer to establish care at  [facility]. Nevertheless, we have left the appointment with Dr. Duaine Dredge in  place as a fall-back.      Nelma Rothman, MD  Electronically Signed     RAR/MEDQ  D:  01/11/2006  T:  01/11/2006  Job:  [id number]   cc:   Dineen Kid. Reche Dixon, M.D.  Fax: [phone]

## 2011-04-08 NOTE — Consult Note (Signed)
NAMESHAREN, YOUNGREN               ACCOUNT NO.:  [id number]   MEDICAL RECORD NO.:  [id number]          PATIENT TYPE:  INP   LOCATION:  1432                         FACILITY:  [facility]   PHYSICIAN:  Shan Levans, M.D. LHCDATE OF BIRTH:  [birth year]   DATE OF CONSULTATION:  12/27/2005  DATE OF DISCHARGE:                                   CONSULTATION   CHIEF COMPLAINT:  Pleural effusion.  Evaluate for drainage.   HISTORY OF PRESENT ILLNESS:  This is a 45 year old, white female admitted on  December 20, 2005, for acute pleuritic pain and dyspnea ultimately found to  have a right pulmonary embolism.  She developed over time right lower lobe  consolidation and presumed pneumonia versus pulmonary infarct.  She was  treated empirically with antibiotics, given IV heparin and then switched  over to Coumadin therapy with appropriate heparin, Coumadin overlap with  therapeutic INR.  She is now off heparin therapy as of December 26, 2005, and  is now on Coumadin therapy with a therapeutic INR.  The patient still has  some dyspnea with exertion and dyspnea when taking a deep breath.  Cough has  decreased overall.  Her symptomatology is improved and still requiring some  supplemental oxygen therapy.   PAST MEDICAL HISTORY:  1.  Hypertension.  2.  Chronic smoking.   MEDICATIONS:  No previous medications prior to admission.   ALLERGIES:  No known drug allergies.   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  Smokes a 1/2  pack of cigarettes a day.  Denies alcohol.  She has no children.  She is  single and lives alone.   FAMILY HISTORY:  Positive for her mother dying at age 56 of human  immunodeficiency virus and father alive and well at age 63.   PHYSICAL EXAMINATION:  VITAL SIGNS:  T-max 101, now 99, blood pressure  110/70, pulse 110, respirations 28, saturations 99%.  CHEST:  Decreased breath sounds and dullness to percussion one-quarter of  the way up on the right.  Clear  on the left.  No pleural friction rub is  heard.  CARDIAC:  Regular rate and rhythm without S3, normal S1, S2.  ABDOMEN:  Soft, nontender.  EXTREMITIES:  No clubbing, edema or venous disease.  SKIN:  Clear.  NEUROLOGIC:  Intact.  HEENT:  No jugular venous distention, lymphadenopathy.  Oropharynx was  clear.  NECK:  Supple.   LABORATORY DATA AND X-RAY FINDINGS:  CT scan of the chest from admission  showed right pulmonary embolism.  Followup chest x-ray showed a stable,  right pleural effusion with associated right lower lung consolidation.   INR is currently therapeutic.  ANA is positive 1 to 40.  White count 14.9,  hemoglobin 10.6.  INR was 3.0 on December 26, 2005.  On 2 L, pH was 7.37,  pCO2 41, pO2 114.  Hypercoagulable workup shows negative factor V Leiden  assay.  There is a lupus anticoagulant present.  Protein C and Protein S  levels were normal except there was a reduction in functional Protein S  which may be related  to acute clotting.   IMPRESSION:  Right pulmonary embolism with right lower lobe pulmonary  infarct, resolving pneumonia, right pleural effusion likely parapneumonic in  nature secondary to pneumonia and pulmonary infarct with pulmonary embolism.   RECOMMENDATIONS:  At this point, the patient has a therapeutic INR and I do  not recommend allowing the INR drift to normal and resuming heparin drip and  tapping effusion.  I rather would prefer to watch this patient at this time  and follow up expectantly.  We will re-image the chest with chest x-ray and  lateral decubitus film.      Shan Levans, M.D. [facility]  Electronically Signed     PW/MEDQ  D:  12/27/2005  T:  12/27/2005  Job:  [id number]   cc:   Miachel Roux L. Robb Matar, M.D.   Mosetta Putt, M.D.  Fax: [phone]

## 2011-04-08 NOTE — Discharge Summary (Signed)
NAMESAIA, Crystal Frank               ACCOUNT NO.:  [id number]   MEDICAL RECORD NO.:  [id number]          PATIENT TYPE:  INP   LOCATION:  1411                         FACILITY:  [facility]   PHYSICIAN:  Hettie Holstein, D.O.    DATE OF BIRTH:  08-Dec-1965   DATE OF ADMISSION:  12/20/2005  DATE OF DISCHARGE:  01/09/2006                                 DISCHARGE SUMMARY   PHYSICIANS:  Primary care physician: Mosetta Putt, M.D., though patient  referred to Dr. Reche Dixon, [facility], discharged due to patient request based  on financial constraints.   DATE OF DISCHARGE:  Tentatively scheduled for January 09, 2006, pending  course overnight.   REASON FOR ADMISSION:  Chest pain   PRINCIPAL DIAGNOSES:  1.  Acute pulmonary embolus complicated by pulmonary infarct and loculated      right-sided pleural effusion status post evaluation by pulmonary      critical care, CVTS, and interventional radiology with inability to      drain or perform diagnostic studies on the effusion.  The patient was      seen by CVTS, Dr. Cornelius Moras, who suggested conservative management with      observation of the pleural effusion and antibiotic therapy with      recommendation to re-attempt ultrasound thoracentesis if increase in      pleural effusion size was noted.  If she develops any clinical      decompensation and enlarging pleural effusion, he could recommend      proceeding with video-assisted thoracoscopy.  At this time, we are going      to follow serial chest x-rays as her effusion has been noted to be      improving and decreased, and she has had no clinical decompensation.      She will continue her treatment for pulmonary embolus and follow up with      Dr. Reche Dixon on Wednesday and have a followup chest x-ray one week from      discharge.  2.  Positive lupus anticoagulant during this hospitalization.  This was      actually prior to her first dose of heparin or Coumadin.  In any event,      she was seen in  consultation by Dr. Clelia Croft, of hematology/oncology, who      did recommend continuation of Coumadin for a total of 6 months and at      that time undergo reevaluation with repeat hypercoagulable panel.  3.  Inpatient thrombocytopenia.  She underwent extensive evaluation and, as      above, consultation by Dr. Clelia Croft.  It was not felt this was heparin-      induced thrombocytopenia.  Her platelet count did begin to slowly rise      and normalize towards the latter part of her hospitalization to 168,000      at time of this dictation.  4.  Community-acquired pneumonia in the context of pulmonary infarction. She      will continue a 14-day course of antibiotics with Avelox.  This should      conclude on January 16, 2006.  5.  Tobacco dependence.  She did wean from smoking during this      hospitalization and promises she will not resume once discharged.   PROCEDURES:  As noted above, she has undergone ICU course and evaluation by  pulmonary critical care service.  She did not require intubation.   She underwent ultrasound-guided thoracentesis without conclusive findings.   She underwent Doppler studies without evidence of DVT.   CT, as noted above, revealed extensive pulmonary thromboembolus in the right  lower lobe with extensive right lower lobe air space disease.  Subsequent  CTs revealed progression of the right pleural effusion.  CVTS were  consulted.  Please refer to full consult note by Dr. Cornelius Moras.   MEDICATIONS ON DISCHARGE:  Her INR is currently 2.  She last received a dose  12.5 mg Coumadin.  The final Coumadin dose will be included in the addendum  at the time of discharge.   Other medications:  1.  Avelox 400 mg daily until January 16, 2006.  2.  Phenergan 12.5 mg every 4 hours as needed for nausea.  3.  Oxycodone 5 mg every 4 to 6 hours as needed for moderate pain.  4.  Tylenol 650 mg every 4 to 6 hours as needed for mild pain.  5.  Albuterol metered dose inhaler 2 puffs  4 times a day p.r.n.   DISPOSITION:  Crystal Frank has appointment with Dr. Duaine Dredge on January 12, 2006, at 4 p.m.  He was her original primary care physician, but due to  financial restraints, the patient has requested connection with [facility], and I have contacted Dr. Reche Dixon and have scheduled her appointment on  Wednesday for followup for PT and INR.  I have requested that she undergo  chest x-ray within one week after discharge to follow the effusion.  I would  request that medical records carbon copy this to both Dr. Duaine Dredge and Dr.  Reche Dixon at [facility].   HISTORY OF PRESENT ILLNESS:  Crystal Frank is a pleasant, relatively healthy for  45 year old Caucasian female, with a rather unremarkable past medical  history with exception of previous history of pleurisy years previous to  this presentation without known history of PE.  She had seen a pulmonologist  in [place] at that time.  In any event, about a week prior to  presentation, she developed some pain in her neck and her right thigh.  This  persisted and eventually progressed until Monday of the week of presentation  with involvement of the right shoulder and right chest. She took about 4  ibuprofen, and this seemed to help initially.  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  had persistent pain without relief.  She  became very short of breath. She presented to the emergency department at  [facility] due to quite severe pain.  She underwent CT scanning, and this  revealed positive right pulmonary embolus, and currently initiated a heparin  drip and underwent hypercoagulable workup.   HOSPITAL COURSE:  She was admitted, as noted above.  Course was initially  uneventful.  Subsequently she developed respiratory distress while in the hospital.  CT revealed pulmonary infarct and loculated effusion.  She  underwent evaluation by  pulmonary critical care in addition to  interventional radiology for thoracentesis which was not helpful.  She  underwent CVTS evaluation, and recommendations are outlined above.  Clinical course was that of quite slow improvement.  She has slow rise in  her INR.  She continued on Lovenox, and repeat CT scan revealed progression  of this pleural effusion. At this time, she is felt to be medically stable  for discharge home with close followup and repeat imaging.   She was evaluated by Dr. Clelia Croft because of initial decrease in her platelets  which subsequently did improve, and there was no evidence of heparin-induced  thrombocytopenia.  Pharmacy followed her course on Coumadin therapy.  At the time of dictation, her white blood cell count trended downwards with  WBC 12.5, hemoglobin 9.9, platelets count of 168, MCV 84.  Current INR is 2.  For final amendments, please see addendum as dictated by discharging  physician.      Hettie Holstein, D.O.  Electronically Signed     ESS/MEDQ  D:  01/08/2006  T:  01/08/2006  Job:  [id number]   cc:   Dtr. Reche Dixon, [facility]  Also include copy consult reports done  Dr. Cornelius Moras as well as Dr. Adele Schilder, M.D.  Also include copy consult reports done  by Dr. Cornelius Moras and Dr. Sundra Aland H. Cornelius Moras, M.D.  [address]  [place]  [place] [address]

## 2011-04-11 ENCOUNTER — Encounter (HOSPITAL_BASED_OUTPATIENT_CLINIC_OR_DEPARTMENT_OTHER): Payer: Self-pay | Admitting: Oncology

## 2011-04-11 ENCOUNTER — Other Ambulatory Visit (HOSPITAL_COMMUNITY): Payer: Self-pay | Admitting: Oncology

## 2011-04-11 DIAGNOSIS — Z86718 Personal history of other venous thrombosis and embolism: Secondary | ICD-10-CM

## 2011-04-11 DIAGNOSIS — D6859 Other primary thrombophilia: Secondary | ICD-10-CM

## 2011-04-11 DIAGNOSIS — Z7901 Long term (current) use of anticoagulants: Secondary | ICD-10-CM

## 2011-04-11 LAB — PROTIME-INR
INR: 2.7 (ref 2.00–3.50)
Protime: 32.4 Seconds — ABNORMAL HIGH (ref 10.6–13.4)

## 2011-04-20 ENCOUNTER — Other Ambulatory Visit (HOSPITAL_COMMUNITY): Payer: Worker's Compensation

## 2011-04-20 ENCOUNTER — Inpatient Hospital Stay (HOSPITAL_COMMUNITY)
Admission: RE | Admit: 2011-04-20 | Discharge: 2011-04-24 | DRG: 493 | Disposition: A | Discharge status: Home / Self Care | Payer: Worker's Compensation | Source: Ambulatory Visit | Attending: Orthopedic Surgery | Admitting: Orthopedic Surgery

## 2011-04-20 ENCOUNTER — Inpatient Hospital Stay (HOSPITAL_COMMUNITY): Payer: Worker's Compensation

## 2011-04-20 DIAGNOSIS — Z86711 Personal history of pulmonary embolism: Secondary | ICD-10-CM

## 2011-04-20 DIAGNOSIS — M12579 Traumatic arthropathy, unspecified ankle and foot: Principal | ICD-10-CM | POA: Diagnosis present

## 2011-04-20 DIAGNOSIS — K219 Gastro-esophageal reflux disease without esophagitis: Secondary | ICD-10-CM | POA: Diagnosis present

## 2011-04-20 DIAGNOSIS — M87073 Idiopathic aseptic necrosis of unspecified ankle: Secondary | ICD-10-CM | POA: Diagnosis present

## 2011-04-20 DIAGNOSIS — Z7901 Long term (current) use of anticoagulants: Secondary | ICD-10-CM

## 2011-04-20 DIAGNOSIS — Z79899 Other long term (current) drug therapy: Secondary | ICD-10-CM

## 2011-04-20 DIAGNOSIS — Z833 Family history of diabetes mellitus: Secondary | ICD-10-CM

## 2011-04-20 DIAGNOSIS — I10 Essential (primary) hypertension: Secondary | ICD-10-CM | POA: Diagnosis present

## 2011-04-20 DIAGNOSIS — F411 Generalized anxiety disorder: Secondary | ICD-10-CM | POA: Diagnosis present

## 2011-04-20 DIAGNOSIS — R894 Abnormal immunological findings in specimens from other organs, systems and tissues: Secondary | ICD-10-CM | POA: Diagnosis present

## 2011-04-20 DIAGNOSIS — Z472 Encounter for removal of internal fixation device: Secondary | ICD-10-CM

## 2011-04-20 LAB — COMPREHENSIVE METABOLIC PANEL
ALT: 33 U/L (ref 0–35)
AST: 24 U/L (ref 0–37)
Albumin: 3.9 g/dL (ref 3.5–5.2)
Alkaline Phosphatase: 61 U/L (ref 39–117)
BUN: 10 mg/dL (ref 6–23)
CO2: 27 mEq/L (ref 19–32)
Calcium: 8.8 mg/dL (ref 8.4–10.5)
Chloride: 100 mEq/L (ref 96–112)
Creatinine, Ser: 0.77 mg/dL (ref 0.4–1.2)
GFR calc Af Amer: >60 mL/min (ref >60)
GFR calc non Af Amer: >60 mL/min (ref >60)
Glucose, Bld: 87 mg/dL (ref 70–99)
Potassium: 3.1 mEq/L — ABNORMAL LOW (ref 3.5–5.1)
Sodium: 137 mEq/L (ref 135–145)
Total Bilirubin: 0.7 mg/dL (ref 0.3–1.2)
Total Protein: 7.1 g/dL (ref 6.0–8.3)

## 2011-04-20 LAB — DIFFERENTIAL
Basophils Absolute: 0 10*3/uL (ref 0.0–0.1)
Basophils Relative: 0 % (ref 0–1)
Eosinophils Absolute: 0 10*3/uL (ref 0.0–0.7)
Eosinophils Relative: 0 % (ref 0–5)
Lymphocytes Relative: 29 % (ref 12–46)
Lymphs Abs: 3.3 10*3/uL (ref 0.7–4.0)
Monocytes Absolute: 1 10*3/uL (ref 0.1–1.0)
Monocytes Relative: 9 % (ref 3–12)
Neutro Abs: 6.7 10*3/uL (ref 1.7–7.7)
Neutrophils Relative %: 61 % (ref 43–77)

## 2011-04-20 LAB — MRSA PCR SCREENING: MRSA by PCR: NEGATIVE

## 2011-04-20 LAB — APTT: aPTT: 40 seconds — ABNORMAL HIGH (ref 24–37)

## 2011-04-20 LAB — URINALYSIS, ROUTINE W REFLEX MICROSCOPIC
Bilirubin Urine: NEGATIVE
Glucose, UA: NEGATIVE mg/dL
Hgb urine dipstick: NEGATIVE
Ketones, ur: NEGATIVE mg/dL
Nitrite: NEGATIVE
Protein, ur: NEGATIVE mg/dL
Specific Gravity, Urine: 1.012 (ref 1.005–1.030)
Urobilinogen, UA: 1 mg/dL (ref 0.0–1.0)
pH: 6.5 (ref 5.0–8.0)

## 2011-04-20 LAB — CBC
HCT: 41.5 % (ref 36.0–46.0)
Hemoglobin: 14.4 g/dL (ref 12.0–15.0)
MCH: 28.6 pg (ref 26.0–34.0)
MCHC: 34.7 g/dL (ref 30.0–36.0)
MCV: 82.3 fL (ref 78.0–100.0)
Platelets: 308 10*3/uL (ref 150–400)
RBC: 5.04 MIL/uL (ref 3.87–5.11)
RDW: 15.2 % (ref 11.5–15.5)
WBC: 11.1 10*3/uL — ABNORMAL HIGH (ref 4.0–10.5)

## 2011-04-20 LAB — PROTIME-INR
INR: 1 (ref 0.00–1.49)
Prothrombin Time: 13.4 seconds (ref 11.6–15.2)

## 2011-04-20 LAB — ABO/RH: ABO/RH(D): B POS

## 2011-04-20 LAB — TYPE AND SCREEN
ABO/RH(D): B POS
Antibody Screen: NEGATIVE

## 2011-04-20 LAB — URINE MICROSCOPIC-ADD ON

## 2011-04-21 ENCOUNTER — Inpatient Hospital Stay (HOSPITAL_COMMUNITY): Payer: Worker's Compensation

## 2011-04-21 ENCOUNTER — Inpatient Hospital Stay (HOSPITAL_COMMUNITY)
Admission: RE | Admit: 2011-04-21 | Disposition: A | Discharge status: Home / Self Care | Payer: Worker's Compensation | Source: Ambulatory Visit | Admitting: Orthopedic Surgery

## 2011-04-21 LAB — URINE CULTURE
Colony Count: 25000
Culture  Setup Time: 201205302141
Special Requests: NEGATIVE

## 2011-04-22 LAB — BASIC METABOLIC PANEL
BUN: 5 mg/dL — ABNORMAL LOW (ref 6–23)
CO2: 27 mEq/L (ref 19–32)
Calcium: 7.9 mg/dL — ABNORMAL LOW (ref 8.4–10.5)
Chloride: 103 mEq/L (ref 96–112)
Creatinine, Ser: 0.69 mg/dL (ref 0.4–1.2)
GFR calc Af Amer: >60 mL/min (ref >60)
GFR calc non Af Amer: >60 mL/min (ref >60)
Glucose, Bld: 112 mg/dL — ABNORMAL HIGH (ref 70–99)
Potassium: 3.7 mEq/L (ref 3.5–5.1)
Sodium: 137 mEq/L (ref 135–145)

## 2011-04-22 LAB — CBC
HCT: 33.6 % — ABNORMAL LOW (ref 36.0–46.0)
Hemoglobin: 11.3 g/dL — ABNORMAL LOW (ref 12.0–15.0)
MCH: 28 pg (ref 26.0–34.0)
MCHC: 33.6 g/dL (ref 30.0–36.0)
MCV: 83.2 fL (ref 78.0–100.0)
Platelets: 257 10*3/uL (ref 150–400)
RBC: 4.04 MIL/uL (ref 3.87–5.11)
RDW: 15.4 % (ref 11.5–15.5)
WBC: 9.8 10*3/uL (ref 4.0–10.5)

## 2011-04-22 LAB — PROTIME-INR
INR: 1.18 (ref 0.00–1.49)
Prothrombin Time: 15.2 seconds (ref 11.6–15.2)

## 2011-04-23 ENCOUNTER — Inpatient Hospital Stay (HOSPITAL_COMMUNITY): Payer: Worker's Compensation

## 2011-04-23 LAB — URINE MICROSCOPIC-ADD ON

## 2011-04-23 LAB — PROTIME-INR
INR: 1.57 — ABNORMAL HIGH (ref 0.00–1.49)
Prothrombin Time: 19 seconds — ABNORMAL HIGH (ref 11.6–15.2)

## 2011-04-23 LAB — URINALYSIS, ROUTINE W REFLEX MICROSCOPIC
Bilirubin Urine: NEGATIVE
Glucose, UA: NEGATIVE mg/dL
Ketones, ur: NEGATIVE mg/dL
Nitrite: NEGATIVE
Protein, ur: NEGATIVE mg/dL
Specific Gravity, Urine: 1.013 (ref 1.005–1.030)
Urobilinogen, UA: 0.2 mg/dL (ref 0.0–1.0)
pH: 5.5 (ref 5.0–8.0)

## 2011-04-24 LAB — CBC
HCT: 29.1 % — ABNORMAL LOW (ref 36.0–46.0)
Hemoglobin: 9.9 g/dL — ABNORMAL LOW (ref 12.0–15.0)
MCH: 28.3 pg (ref 26.0–34.0)
MCHC: 34 g/dL (ref 30.0–36.0)
MCV: 83.1 fL (ref 78.0–100.0)
Platelets: 224 10*3/uL (ref 150–400)
RBC: 3.5 MIL/uL — ABNORMAL LOW (ref 3.87–5.11)
RDW: 15.5 % (ref 11.5–15.5)
WBC: 10.5 10*3/uL (ref 4.0–10.5)

## 2011-04-24 LAB — PROTIME-INR
INR: 1.37 (ref 0.00–1.49)
Prothrombin Time: 17.1 seconds — ABNORMAL HIGH (ref 11.6–15.2)

## 2011-05-13 ENCOUNTER — Other Ambulatory Visit (HOSPITAL_COMMUNITY): Payer: Self-pay | Admitting: Oncology

## 2011-05-13 ENCOUNTER — Encounter (HOSPITAL_BASED_OUTPATIENT_CLINIC_OR_DEPARTMENT_OTHER): Payer: Worker's Compensation | Admitting: Oncology

## 2011-05-13 DIAGNOSIS — Z5181 Encounter for therapeutic drug level monitoring: Secondary | ICD-10-CM

## 2011-05-13 DIAGNOSIS — Z86718 Personal history of other venous thrombosis and embolism: Secondary | ICD-10-CM

## 2011-05-13 DIAGNOSIS — Z7901 Long term (current) use of anticoagulants: Secondary | ICD-10-CM

## 2011-05-13 LAB — PROTIME-INR
INR: 1.3 — ABNORMAL LOW (ref 2.00–3.50)
Protime: 15.6 Seconds — ABNORMAL HIGH (ref 10.6–13.4)

## 2011-05-17 ENCOUNTER — Encounter (HOSPITAL_BASED_OUTPATIENT_CLINIC_OR_DEPARTMENT_OTHER): Payer: Worker's Compensation | Admitting: Oncology

## 2011-05-17 ENCOUNTER — Other Ambulatory Visit (HOSPITAL_COMMUNITY): Payer: Self-pay | Admitting: Oncology

## 2011-05-17 DIAGNOSIS — D6859 Other primary thrombophilia: Secondary | ICD-10-CM

## 2011-05-17 DIAGNOSIS — Z7901 Long term (current) use of anticoagulants: Secondary | ICD-10-CM

## 2011-05-17 DIAGNOSIS — Z5181 Encounter for therapeutic drug level monitoring: Secondary | ICD-10-CM

## 2011-05-17 LAB — PROTIME-INR
INR: 1.8 — ABNORMAL LOW (ref 2.00–3.50)
Protime: 21.6 Seconds — ABNORMAL HIGH (ref 10.6–13.4)

## 2011-05-24 ENCOUNTER — Encounter (HOSPITAL_BASED_OUTPATIENT_CLINIC_OR_DEPARTMENT_OTHER): Payer: Self-pay | Admitting: Oncology

## 2011-05-24 ENCOUNTER — Other Ambulatory Visit (HOSPITAL_COMMUNITY): Payer: Self-pay | Admitting: Oncology

## 2011-05-24 DIAGNOSIS — D6859 Other primary thrombophilia: Secondary | ICD-10-CM

## 2011-05-24 DIAGNOSIS — Z86718 Personal history of other venous thrombosis and embolism: Secondary | ICD-10-CM

## 2011-05-24 DIAGNOSIS — Z7901 Long term (current) use of anticoagulants: Secondary | ICD-10-CM

## 2011-05-24 LAB — PROTIME-INR
INR: 2.6 (ref 2.00–3.50)
Protime: 31.2 Seconds — ABNORMAL HIGH (ref 10.6–13.4)

## 2011-06-02 ENCOUNTER — Encounter (HOSPITAL_BASED_OUTPATIENT_CLINIC_OR_DEPARTMENT_OTHER): Payer: Worker's Compensation | Admitting: Oncology

## 2011-06-02 ENCOUNTER — Other Ambulatory Visit (HOSPITAL_COMMUNITY): Payer: Self-pay | Admitting: Oncology

## 2011-06-02 DIAGNOSIS — Z86718 Personal history of other venous thrombosis and embolism: Secondary | ICD-10-CM

## 2011-06-02 DIAGNOSIS — D6859 Other primary thrombophilia: Secondary | ICD-10-CM

## 2011-06-02 DIAGNOSIS — Z7901 Long term (current) use of anticoagulants: Secondary | ICD-10-CM

## 2011-06-02 LAB — PROTIME-INR
INR: 2.6 (ref 2.00–3.50)
Protime: 31.2 Seconds — ABNORMAL HIGH (ref 10.6–13.4)

## 2011-06-09 NOTE — Op Note (Signed)
NAMERHIA, BLATCHFORD               ACCOUNT NO.:  [id number]  MEDICAL RECORD NO.:  [id number]           PATIENT TYPE:  I  LOCATION:  5001                         FACILITY:  [facility]  PHYSICIAN:  Doralee Albino. Carola Frost, M.D. DATE OF BIRTH:  03-Oct-1966  DATE OF PROCEDURE:  04/21/2011 DATE OF DISCHARGE:                              OPERATIVE REPORT   PREOPERATIVE DIAGNOSES:  Left ankle and subtalar posttraumatic arthritis, second talus avascular necrosis.  Prominent retained hardware lateral malleolus.  POSTOPERATIVE DIAGNOSES:  Left ankle and subtalar posttraumatic arthritis, second talus avascular necrosis.  Prominent retained hardware lateral malleolus.  PROCEDURES: 1. Left subtalar arthrodesis. 2. Left ankle arthrodesis. 3. Removal of hardware, left fibula.  SURGEON:  Doralee Albino. Carola Frost, MD  ASSISTANT:  Mearl Latin, PA  ANESTHESIA:  General.  COMPLICATIONS:  None.  FINDINGS:  No evidence of infection, extensive arthritic changes of the ankle and subtalar joints with avascular necrosis of the talus.  ESTIMATED BLOOD LOSS:  Minimal.  TOTAL TOURNIQUET TIME:  2 hours and 21 minutes.  DISPOSITION:  To PACU.  CONDITION:  Stable.  BRIEF SUMMARY/INDICATIONS FOR PROCEDURE:  Crystal Frank is a 45 year old female status post bilateral foot and ankle injuries who underwent prolonged rehabilitation with development of avascular necrosis on the left, progressive subtalar arthritis and tibiotalar arthritis.  This had been refractory to conservative measures.  She is also status post ankle fusion on the right with good relief of pain.  Because of her inability to continue with activities of daily living, she requested fusion.  She did understand the risks to include failure of the fusion, nerve injury, vessel injury, need for further surgery, DVT, PE, which was particularly elevated her case because of thromboembolic disease and multiple others. She did again wished to  proceed.  DESCRIPTION OF PROCEDURE:  Crystal Frank was administered preop antibiotics, taken to operating room where general anesthesia was induced.  The left lower extremity was prepped in usual sterile fashion.  Tourniquet was placed about the thigh, began the initial exposure for removal of the lateral malleolus through a vertical incision using her old one. Carried dissection sharply down to the plate except in the proximal portion of the wound where as dissection was carefully done to avoid any injury to the superficial peroneal nerve.  It was identified well proximal and the wound retracted anteriorly.  Plate was then removed along with all of the screws, made a lateral based foot incision at the tip of the malleolus extending that distally, elevated the EDB insertion from the sinus tarsi and exposed the subtalar joint.  This distractor was placed into this and the articular surface removed with osteotomes and curettes.  The eburnated bone was roughened with curettes and then multiple punctate holes for bleeding were placed with a 60 K-wire.  The subtalar surface of the lateral facet and talus were treated in like manner and also over to the middle facet which was scraped out as well. Then made a vertical incision proximally, was able to carry down to the fibula, perform an osteotomy obliquely at the joint or just proximal to it.  This facilitated exposure  of the tibiotalar joint.  K-wires were placed to dial in appropriate trajectory for the osteotomy as well as how to cut the talus.  This was performed for matching surfaces perpendicular to the long axis, a separate medial incision through which the fusion was done was also made carefully protecting the posterior neurovascular bundle as well as excising all of the soft tissue attachments to the medial malleolus, this enabled apposition and correct translation of the talus relative to the tibia and a appropriate amount of this gentle  external rotation was applied slight extension at the tibiotalar joint and neutral varus-valgus angulation.  This was confirmed on C-arm pinned provisionally and then the threaded guidewire for the nail advanced across the calcaneus into the subtalar joint and into the tibiotalar joint in the center-center position.  The 150-mm nail was then inserted after reaming to 11 mm.  Static locks were placed in the calcaneus posteriorly.  The subtalar joint impacted and then the screw placed into the talus followed by further impaction and two screws placed into the tibia including one dynamic most proximally.  The tourniquet was inflated after placement of the talar screw.  Extensive bone graft was then placed anteriorly and posteriorly, medially and also some had been placed within the middle facet of the subtalar joint prior to reducing and driving the hardware across.  Additional graft was placed in the sinus tarsi.  Final images showed appropriate reduction hardware trajectory length.  Wounds were irrigated and closed in standard layered fashion with 0 Vicryl, 2-0 Vicryl, and 3-0 nylon. Sterile gently compressive dressing and posterior stirrup splint was applied.  The patient was then awakened from anesthesia and transported to PACU in stable condition.  Montez Morita, PA-C, assisted me throughout the procedure and was absolutely necessary for the safe and effective completion.  He was required to maintain projection of the neurovascular bundles retract the soft tissues during osteotomy, control rotation during provisional definitive fixation and also control the knee, as she was in the lazy lateral position.  Furthermore, he assisted me with simultaneous wound closure to further expedite the case.  PROGNOSIS:  Crystal Frank remains at high risk for thromboembolic complications given her coagulopathy.  She will be managed on the pharmacologic program I used previously.  She will be  nonweightbearing on the right lower extremity for the next 8 weeks with weightbearing as tolerated in a protective boot for the ensuing months.  She will be followed with serial x-rays.  We anticipate a 1-2 day stay in the hospital.     Doralee Albino. Carola Frost, M.D.     MHH/MEDQ  D:  04/21/2011  T:  04/22/2011  Job:  [id number]  Electronically Signed by Myrene Galas M.D. on 06/09/2011 06:29:03 PM

## 2011-06-16 ENCOUNTER — Other Ambulatory Visit (HOSPITAL_COMMUNITY): Payer: Self-pay | Admitting: Oncology

## 2011-06-16 ENCOUNTER — Encounter (HOSPITAL_BASED_OUTPATIENT_CLINIC_OR_DEPARTMENT_OTHER): Payer: Self-pay | Admitting: Oncology

## 2011-06-16 DIAGNOSIS — D6859 Other primary thrombophilia: Secondary | ICD-10-CM

## 2011-06-16 DIAGNOSIS — Z7901 Long term (current) use of anticoagulants: Secondary | ICD-10-CM

## 2011-06-16 DIAGNOSIS — Z86718 Personal history of other venous thrombosis and embolism: Secondary | ICD-10-CM

## 2011-06-16 LAB — PROTIME-INR
INR: 2.1 (ref 2.00–3.50)
Protime: 25.2 Seconds — ABNORMAL HIGH (ref 10.6–13.4)

## 2011-07-01 ENCOUNTER — Encounter: Payer: Self-pay | Admitting: Oncology

## 2011-07-01 ENCOUNTER — Other Ambulatory Visit (HOSPITAL_COMMUNITY): Payer: Self-pay | Admitting: Oncology

## 2011-07-01 LAB — PROTIME-INR
INR: 2.4 (ref 2.00–3.50)
Protime: 28.8 Seconds — ABNORMAL HIGH (ref 10.6–13.4)
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
## 2011-07-22 ENCOUNTER — Other Ambulatory Visit (HOSPITAL_COMMUNITY): Payer: Self-pay | Admitting: Oncology

## 2011-07-22 ENCOUNTER — Encounter (HOSPITAL_BASED_OUTPATIENT_CLINIC_OR_DEPARTMENT_OTHER): Payer: Self-pay | Admitting: Oncology

## 2011-07-22 DIAGNOSIS — D6859 Other primary thrombophilia: Secondary | ICD-10-CM

## 2011-07-22 DIAGNOSIS — Z7901 Long term (current) use of anticoagulants: Secondary | ICD-10-CM

## 2011-07-22 DIAGNOSIS — Z86718 Personal history of other venous thrombosis and embolism: Secondary | ICD-10-CM

## 2011-07-22 LAB — COMPREHENSIVE METABOLIC PANEL
ALT: 23 U/L (ref 0–35)
AST: 18 U/L (ref 0–37)
Albumin: 4.3 g/dL (ref 3.5–5.2)
Alkaline Phosphatase: 74 U/L (ref 39–117)
BUN: 11 mg/dL (ref 6–23)
CO2: 25 mEq/L (ref 19–32)
Calcium: 8.8 mg/dL (ref 8.4–10.5)
Chloride: 105 mEq/L (ref 96–112)
Creatinine, Ser: 0.87 mg/dL (ref 0.50–1.10)
Glucose, Bld: 99 mg/dL (ref 70–99)
Potassium: 4.3 mEq/L (ref 3.5–5.3)
Sodium: 140 mEq/L (ref 135–145)
Total Bilirubin: 0.3 mg/dL (ref 0.3–1.2)
Total Protein: 6.4 g/dL (ref 6.0–8.3)

## 2011-07-22 LAB — CBC WITH DIFFERENTIAL/PLATELET
BASO%: 0.5 % (ref 0.0–2.0)
Basophils Absolute: 0 10*3/uL (ref 0.0–0.1)
EOS%: 0.6 % (ref 0.0–7.0)
Eosinophils Absolute: 0.1 10*3/uL (ref 0.0–0.5)
HCT: 37.9 % (ref 34.8–46.6)
HGB: 13.4 g/dL (ref 11.6–15.9)
LYMPH%: 35.2 % (ref 14.0–49.7)
MCH: 28.5 pg (ref 25.1–34.0)
MCHC: 35.3 g/dL (ref 31.5–36.0)
MCV: 80.9 fL (ref 79.5–101.0)
MONO#: 0.9 10*3/uL (ref 0.1–0.9)
MONO%: 9.5 % (ref 0.0–14.0)
NEUT#: 5.3 10*3/uL (ref 1.5–6.5)
NEUT%: 54.2 % (ref 38.4–76.8)
Platelets: 265 10*3/uL (ref 145–400)
RBC: 4.69 10*6/uL (ref 3.70–5.45)
RDW: 15.4 % — ABNORMAL HIGH (ref 11.2–14.5)
WBC: 9.8 10*3/uL (ref 3.9–10.3)
lymph#: 3.4 10*3/uL — ABNORMAL HIGH (ref 0.9–3.3)

## 2011-07-22 LAB — PROTIME-INR
INR: 3.7 — ABNORMAL HIGH (ref 2.00–3.50)
Protime: 44.4 Seconds — ABNORMAL HIGH (ref 10.6–13.4)

## 2011-07-29 ENCOUNTER — Encounter (HOSPITAL_BASED_OUTPATIENT_CLINIC_OR_DEPARTMENT_OTHER): Payer: Self-pay | Admitting: Oncology

## 2011-07-29 ENCOUNTER — Other Ambulatory Visit (HOSPITAL_COMMUNITY): Payer: Self-pay | Admitting: Oncology

## 2011-07-29 DIAGNOSIS — D6859 Other primary thrombophilia: Secondary | ICD-10-CM

## 2011-07-29 DIAGNOSIS — Z7901 Long term (current) use of anticoagulants: Secondary | ICD-10-CM

## 2011-07-29 DIAGNOSIS — Z86718 Personal history of other venous thrombosis and embolism: Secondary | ICD-10-CM

## 2011-07-29 LAB — CBC WITH DIFFERENTIAL/PLATELET
BASO%: 0.4 % (ref 0.0–2.0)
Basophils Absolute: 0 10*3/uL (ref 0.0–0.1)
EOS%: 0.3 % (ref 0.0–7.0)
Eosinophils Absolute: 0 10*3/uL (ref 0.0–0.5)
HCT: 41.5 % (ref 34.8–46.6)
HGB: 14.4 g/dL (ref 11.6–15.9)
LYMPH%: 26.7 % (ref 14.0–49.7)
MCH: 27.6 pg (ref 25.1–34.0)
MCHC: 34.7 g/dL (ref 31.5–36.0)
MCV: 79.5 fL (ref 79.5–101.0)
MONO#: 1.1 10*3/uL — ABNORMAL HIGH (ref 0.1–0.9)
MONO%: 10.1 % (ref 0.0–14.0)
NEUT#: 7 10*3/uL — ABNORMAL HIGH (ref 1.5–6.5)
NEUT%: 62.5 % (ref 38.4–76.8)
Platelets: 289 10*3/uL (ref 145–400)
RBC: 5.22 10*6/uL (ref 3.70–5.45)
RDW: 15.6 % — ABNORMAL HIGH (ref 11.2–14.5)
WBC: 11.2 10*3/uL — ABNORMAL HIGH (ref 3.9–10.3)
lymph#: 3 10*3/uL (ref 0.9–3.3)
nRBC: 0 % (ref 0–0)

## 2011-07-29 LAB — PROTIME-INR
INR: 2.1 (ref 2.00–3.50)
Protime: 25.2 Seconds — ABNORMAL HIGH (ref 10.6–13.4)

## 2011-08-08 ENCOUNTER — Other Ambulatory Visit (HOSPITAL_COMMUNITY): Payer: Self-pay | Admitting: Oncology

## 2011-08-08 ENCOUNTER — Encounter (HOSPITAL_BASED_OUTPATIENT_CLINIC_OR_DEPARTMENT_OTHER): Payer: Worker's Compensation | Admitting: Oncology

## 2011-08-08 DIAGNOSIS — D6859 Other primary thrombophilia: Secondary | ICD-10-CM

## 2011-08-08 DIAGNOSIS — Z7901 Long term (current) use of anticoagulants: Secondary | ICD-10-CM

## 2011-08-08 DIAGNOSIS — Z86718 Personal history of other venous thrombosis and embolism: Secondary | ICD-10-CM

## 2011-08-08 LAB — PROTIME-INR
INR: 3.3 (ref 2.00–3.50)
Protime: 39.6 Seconds — ABNORMAL HIGH (ref 10.6–13.4)

## 2011-08-10 ENCOUNTER — Encounter (HOSPITAL_COMMUNITY)
Admission: RE | Admit: 2011-08-10 | Discharge: 2011-08-10 | Disposition: A | Discharge status: Home / Self Care | Payer: Worker's Compensation | Source: Ambulatory Visit | Attending: Orthopedic Surgery | Admitting: Orthopedic Surgery

## 2011-08-10 LAB — CBC
HCT: 42.1 % (ref 36.0–46.0)
Hemoglobin: 14.5 g/dL (ref 12.0–15.0)
MCH: 27.9 pg (ref 26.0–34.0)
MCHC: 34.4 g/dL (ref 30.0–36.0)
MCV: 81 fL (ref 78.0–100.0)
Platelets: 401 10*3/uL — ABNORMAL HIGH (ref 150–400)
RBC: 5.2 MIL/uL — ABNORMAL HIGH (ref 3.87–5.11)
RDW: 15.5 % (ref 11.5–15.5)
WBC: 10.6 10*3/uL — ABNORMAL HIGH (ref 4.0–10.5)

## 2011-08-10 LAB — BASIC METABOLIC PANEL
BUN: 10 mg/dL (ref 6–23)
CO2: 24 mEq/L (ref 19–32)
Calcium: 9.7 mg/dL (ref 8.4–10.5)
Chloride: 105 mEq/L (ref 96–112)
Creatinine, Ser: 0.97 mg/dL (ref 0.50–1.10)
GFR calc Af Amer: >60 mL/min (ref >60)
GFR calc non Af Amer: >60 mL/min (ref >60)
Glucose, Bld: 115 mg/dL — ABNORMAL HIGH (ref 70–99)
Potassium: 5.3 mEq/L — ABNORMAL HIGH (ref 3.5–5.1)
Sodium: 139 mEq/L (ref 135–145)

## 2011-08-10 LAB — SURGICAL PCR SCREEN
MRSA, PCR: NEGATIVE
Staphylococcus aureus: NEGATIVE

## 2011-08-10 LAB — PROTIME-INR
INR: 1.72 — ABNORMAL HIGH (ref 0.00–1.49)
Prothrombin Time: 20.5 seconds — ABNORMAL HIGH (ref 11.6–15.2)

## 2011-08-10 LAB — APTT: aPTT: 40 seconds — ABNORMAL HIGH (ref 24–37)

## 2011-08-10 LAB — HCG, SERUM, QUALITATIVE: Preg, Serum: NEGATIVE

## 2011-08-11 ENCOUNTER — Ambulatory Visit (HOSPITAL_COMMUNITY)
Admission: RE | Admit: 2011-08-11 | Discharge: 2011-08-11 | Disposition: A | Discharge status: Home / Self Care | Payer: Worker's Compensation | Source: Ambulatory Visit | Attending: Orthopedic Surgery | Admitting: Orthopedic Surgery

## 2011-08-11 ENCOUNTER — Ambulatory Visit (HOSPITAL_COMMUNITY): Payer: Worker's Compensation

## 2011-08-11 DIAGNOSIS — IMO0002 Reserved for concepts with insufficient information to code with codable children: Secondary | ICD-10-CM | POA: Insufficient documentation

## 2011-08-11 DIAGNOSIS — Z01818 Encounter for other preprocedural examination: Secondary | ICD-10-CM | POA: Insufficient documentation

## 2011-08-11 DIAGNOSIS — Z01812 Encounter for preprocedural laboratory examination: Secondary | ICD-10-CM | POA: Insufficient documentation

## 2011-08-11 DIAGNOSIS — Z472 Encounter for removal of internal fixation device: Secondary | ICD-10-CM | POA: Insufficient documentation

## 2011-08-12 ENCOUNTER — Emergency Department (HOSPITAL_COMMUNITY)
Admission: EM | Admit: 2011-08-12 | Discharge: 2011-08-12 | Disposition: A | Discharge status: Home / Self Care | Payer: Worker's Compensation | Attending: Emergency Medicine | Admitting: Emergency Medicine

## 2011-08-12 DIAGNOSIS — M329 Systemic lupus erythematosus, unspecified: Secondary | ICD-10-CM | POA: Insufficient documentation

## 2011-08-12 DIAGNOSIS — G8929 Other chronic pain: Secondary | ICD-10-CM | POA: Insufficient documentation

## 2011-08-12 DIAGNOSIS — F17200 Nicotine dependence, unspecified, uncomplicated: Secondary | ICD-10-CM | POA: Insufficient documentation

## 2011-08-12 DIAGNOSIS — G8918 Other acute postprocedural pain: Secondary | ICD-10-CM | POA: Insufficient documentation

## 2011-08-12 DIAGNOSIS — Z86718 Personal history of other venous thrombosis and embolism: Secondary | ICD-10-CM | POA: Insufficient documentation
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
## 2011-09-05 LAB — DIFFERENTIAL
Basophils Absolute: 0.1
Basophils Relative: 1
Eosinophils Absolute: 0
Eosinophils Relative: 0
Lymphocytes Relative: 40
Lymphs Abs: 4 — ABNORMAL HIGH
Monocytes Absolute: 1 — ABNORMAL HIGH
Monocytes Relative: 11
Neutro Abs: 4.8
Neutrophils Relative %: 48

## 2011-09-05 LAB — POCT PREGNANCY, URINE
Operator id: 173591
Preg Test, Ur: NEGATIVE

## 2011-09-05 LAB — COMPREHENSIVE METABOLIC PANEL
ALT: 14
AST: 13
Albumin: 3.6
Alkaline Phosphatase: 52
BUN: 11
CO2: 23
Calcium: 8.8
Chloride: 109
Creatinine, Ser: 0.75
GFR calc Af Amer: >60
GFR calc non Af Amer: >60
Glucose, Bld: 113 — ABNORMAL HIGH
Potassium: 3.4 — ABNORMAL LOW
Sodium: 142
Total Bilirubin: 0.7
Total Protein: 6.5

## 2011-09-05 LAB — URINE MICROSCOPIC-ADD ON

## 2011-09-05 LAB — CBC
HCT: 38.4
Hemoglobin: 13.6
MCHC: 35.3
MCV: 83.9
Platelets: 278
RBC: 4.58
RDW: 15.4 — ABNORMAL HIGH
WBC: 9.9

## 2011-09-05 LAB — RAPID URINE DRUG SCREEN, HOSP PERFORMED
Amphetamines: NOT DETECTED
Barbiturates: NOT DETECTED
Benzodiazepines: NOT DETECTED
Cocaine: POSITIVE — AB
Opiates: NOT DETECTED
Tetrahydrocannabinol: NOT DETECTED

## 2011-09-05 LAB — ETHANOL: Alcohol, Ethyl (B): 24 — ABNORMAL HIGH

## 2011-09-05 LAB — URINALYSIS, ROUTINE W REFLEX MICROSCOPIC
Bilirubin Urine: NEGATIVE
Glucose, UA: NEGATIVE
Ketones, ur: NEGATIVE
Leukocytes, UA: NEGATIVE
Nitrite: NEGATIVE
Protein, ur: NEGATIVE
Specific Gravity, Urine: 1.007
Urobilinogen, UA: 0.2
pH: 6

## 2011-09-19 ENCOUNTER — Other Ambulatory Visit (HOSPITAL_COMMUNITY): Payer: Self-pay | Admitting: Oncology

## 2011-09-19 ENCOUNTER — Encounter (HOSPITAL_BASED_OUTPATIENT_CLINIC_OR_DEPARTMENT_OTHER): Payer: Worker's Compensation | Admitting: Oncology

## 2011-09-19 DIAGNOSIS — Z86718 Personal history of other venous thrombosis and embolism: Secondary | ICD-10-CM

## 2011-09-19 DIAGNOSIS — D6859 Other primary thrombophilia: Secondary | ICD-10-CM

## 2011-09-19 DIAGNOSIS — Z7901 Long term (current) use of anticoagulants: Secondary | ICD-10-CM

## 2011-09-19 LAB — PROTIME-INR
INR: 1.9 — ABNORMAL LOW (ref 2.00–3.50)
Protime: 22.8 Seconds — ABNORMAL HIGH (ref 10.6–13.4)

## 2011-10-20 ENCOUNTER — Other Ambulatory Visit: Payer: Self-pay | Admitting: Lab

## 2011-10-21 ENCOUNTER — Telehealth: Payer: Self-pay | Admitting: Oncology

## 2011-10-21 NOTE — Telephone Encounter (Signed)
Returned pt's call and r/s 11/29 lab for 12/6 @ 1:45 pm.

## 2011-10-27 ENCOUNTER — Other Ambulatory Visit: Payer: Self-pay | Admitting: Lab

## 2012-01-24 ENCOUNTER — Other Ambulatory Visit: Payer: Self-pay

## 2012-01-24 ENCOUNTER — Ambulatory Visit: Payer: Self-pay | Admitting: Oncology

## 2012-01-31 ENCOUNTER — Encounter: Payer: Self-pay | Admitting: Medical Oncology

## 2012-01-31 ENCOUNTER — Other Ambulatory Visit: Payer: Self-pay | Admitting: Medical Oncology

## 2012-01-31 ENCOUNTER — Other Ambulatory Visit (HOSPITAL_COMMUNITY): Payer: Self-pay | Admitting: Oncology

## 2012-01-31 DIAGNOSIS — I749 Embolism and thrombosis of unspecified artery: Secondary | ICD-10-CM

## 2012-02-20 ENCOUNTER — Telehealth: Payer: Self-pay | Admitting: Medical Oncology

## 2012-02-20 ENCOUNTER — Other Ambulatory Visit: Payer: Self-pay | Admitting: Orthopedic Surgery

## 2012-02-20 DIAGNOSIS — M25572 Pain in left ankle and joints of left foot: Secondary | ICD-10-CM

## 2012-02-20 NOTE — Telephone Encounter (Signed)
Pecola Leisure manager for pt called stating pt is going to have another surgery on her foot.Lafonda Mosses is requesting we do labs for Dr.Handy when pt comes to get her PT/INR. I called Lafonda Mosses back to let her know the pt has been very non compliant. We last saw pt 07/22/11. She has shown only once to have her PT/INR. We rescheduled her MD visit to 01/24/12 and she missed that appointment only.  Per Dr. Arline Asp pt can be seen by her primary MD or Dr. Carola Frost can check her PT/INR due to missed appointments and non compliance. Lafonda Mosses voiced understanding.

## 2012-02-24 ENCOUNTER — Ambulatory Visit
Admission: RE | Admit: 2012-02-24 | Discharge: 2012-02-24 | Disposition: A | Discharge status: Home / Self Care | Payer: Worker's Compensation | Source: Ambulatory Visit | Attending: Orthopedic Surgery | Admitting: Orthopedic Surgery

## 2012-02-24 ENCOUNTER — Other Ambulatory Visit: Payer: Self-pay | Admitting: Orthopedic Surgery

## 2012-02-24 DIAGNOSIS — M25572 Pain in left ankle and joints of left foot: Secondary | ICD-10-CM

## 2012-02-29 ENCOUNTER — Telehealth: Payer: Self-pay | Admitting: Medical Oncology

## 2012-02-29 NOTE — Telephone Encounter (Signed)
Chrisite from Dr. Magdalene Patricia office called asking why pt was released from our practice. I explained that pt has been very non compliant in keeping  her PT/INR appointments and her MD follow up. I explained that she will call to reschedule and still no show. I informed her that she last PT/INR with our office was 09/19/11 so we are not sure who if anyone has been monitoring her coumadin. I explained that every surgery she has we provided free lovenox for pt but once she gets thru the surgery we never see her. Dr. Arline Asp recommended she be seen at a coumadin clinic or referred to another hematologist. She will relay this information to the PA. She did say the pt's caseworker was with her when she saw Dr. Carola Frost yesterday.  I informed her the case worker is aware of the above.

## 2012-03-27 ENCOUNTER — Other Ambulatory Visit: Payer: Self-pay

## 2012-03-27 ENCOUNTER — Telehealth: Payer: Self-pay

## 2012-03-27 NOTE — Telephone Encounter (Signed)
Received call at 1008 that from Crystal Frank case manager for pt. She stated pt is going to have her last ankle surgery and asking for appt with [facility] for bridging her coumadin. Spoke with Dr Arline Asp and he said that he is no longer seeing the pt because of missed appts and non-compliance. [facility] stated that pt can have coumadin bridged by her PCP or Dr Crystal Frank. This information was left in a voice message to Ms Crystal Frank.

## 2012-04-11 ENCOUNTER — Telehealth: Payer: Self-pay | Admitting: Pulmonary Disease

## 2012-04-11 NOTE — Telephone Encounter (Signed)
LMOM TCB x1.  Pt was discharged from the hosp most recently 07/2011.  Pt was not consulted by pulmonary at that admission.  She was, however, consulted by pulmonary during her 12/2005 admission for pulmonary embolism, pneumonia, pulmonary infarct (per echart).  Per pt's epic chart, she was discharged from Dr Murinson's office who dosed her coumadin d/t lack of PT/INR lab and physician follow ups.  Last PT/INR was 06/2011.  This is per the 5.7.13 phone note from Dr Murinson's office:  Call Documentation     Gaylord Shih, RN 03/27/2012 3:49 PM Signed  Received call at 1008 that from Eugenie Filler case manager for pt. She stated pt is going to have her last ankle surgery and asking for appt with [facility] for bridging her coumadin. Spoke with Dr Arline Asp and he said that he is no longer seeing the pt because of missed appts and non-compliance. [facility] stated that pt can have coumadin bridged by her PCP or Dr Carola Frost. This information was left in a voice message to Ms Abran Cantor.

## 2012-04-12 NOTE — Telephone Encounter (Signed)
[email or web address]. Riley Tsion Inghram, CMA  

## 2012-04-13 ENCOUNTER — Ambulatory Visit (INDEPENDENT_AMBULATORY_CARE_PROVIDER_SITE_OTHER): Payer: Self-pay | Admitting: Pulmonary Disease

## 2012-04-13 ENCOUNTER — Encounter: Payer: Self-pay | Admitting: Pulmonary Disease

## 2012-04-13 VITALS — BP 132/88 | HR 116 | Temp 98.0°F | Ht 71.0 in | Wt 232.6 lb

## 2012-04-13 DIAGNOSIS — I2699 Other pulmonary embolism without acute cor pulmonale: Secondary | ICD-10-CM | POA: Insufficient documentation

## 2012-04-13 NOTE — Telephone Encounter (Signed)
[email or web address]  

## 2012-04-13 NOTE — Patient Instructions (Signed)
Obtain records from Dr Arline Asp Referral to coumadin clinic - we will ask them to see if they can get you lovenox from the company  I will send report to dr Carola Frost - Trauma ortho
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
## 2012-04-14 DIAGNOSIS — I2699 Other pulmonary embolism without acute cor pulmonale: Secondary | ICD-10-CM | POA: Insufficient documentation

## 2012-04-14 NOTE — Assessment & Plan Note (Signed)
Obtain records from Dr Arline Asp to assess why lifelong anticoagulation was advised ? Positive lupus anticoagulant Referral to coumadin clinic - we will ask them to see if they can get her lovenox from the company Ideally she will need to stop coumadin 5 ds prior to surgery , start lovenox & resume post op until coumadin therapeutic No contra indication for surgery otherwise

## 2012-04-17 NOTE — Telephone Encounter (Signed)
See ov 04-13-12

## 2012-04-19 ENCOUNTER — Ambulatory Visit (INDEPENDENT_AMBULATORY_CARE_PROVIDER_SITE_OTHER): Payer: Worker's Compensation | Admitting: *Deleted

## 2012-04-19 DIAGNOSIS — Z7901 Long term (current) use of anticoagulants: Secondary | ICD-10-CM | POA: Insufficient documentation

## 2012-04-19 DIAGNOSIS — I2699 Other pulmonary embolism without acute cor pulmonale: Secondary | ICD-10-CM | POA: Insufficient documentation

## 2012-04-19 LAB — POCT INR: INR: 1.1

## 2012-04-19 MED ORDER — WARFARIN SODIUM 5 MG PO TABS: 5.0000 mg | ORAL_TABLET | ORAL | Status: DC

## 2012-04-19 NOTE — Patient Instructions (Signed)

## 2012-04-23 ENCOUNTER — Ambulatory Visit: Payer: Worker's Compensation | Admitting: Family

## 2012-04-26 ENCOUNTER — Ambulatory Visit (INDEPENDENT_AMBULATORY_CARE_PROVIDER_SITE_OTHER): Payer: Worker's Compensation | Admitting: *Deleted

## 2012-04-26 DIAGNOSIS — Z7901 Long term (current) use of anticoagulants: Secondary | ICD-10-CM

## 2012-04-26 DIAGNOSIS — I2699 Other pulmonary embolism without acute cor pulmonale: Secondary | ICD-10-CM

## 2012-04-26 LAB — POCT INR: INR: 1.3

## 2012-05-03 ENCOUNTER — Ambulatory Visit (INDEPENDENT_AMBULATORY_CARE_PROVIDER_SITE_OTHER): Payer: Worker's Compensation | Admitting: *Deleted

## 2012-05-03 DIAGNOSIS — I2699 Other pulmonary embolism without acute cor pulmonale: Secondary | ICD-10-CM

## 2012-05-03 DIAGNOSIS — Z7901 Long term (current) use of anticoagulants: Secondary | ICD-10-CM

## 2012-05-03 LAB — POCT INR: INR: 4.8

## 2012-05-03 NOTE — Patient Instructions (Signed)
Anticoagulation safety 

## 2012-05-10 ENCOUNTER — Ambulatory Visit (INDEPENDENT_AMBULATORY_CARE_PROVIDER_SITE_OTHER): Payer: Worker's Compensation | Admitting: *Deleted

## 2012-05-10 DIAGNOSIS — I2699 Other pulmonary embolism without acute cor pulmonale: Secondary | ICD-10-CM

## 2012-05-10 DIAGNOSIS — Z7901 Long term (current) use of anticoagulants: Secondary | ICD-10-CM

## 2012-05-10 LAB — POCT INR: INR: 3

## 2012-05-11 ENCOUNTER — Encounter: Payer: Self-pay | Admitting: Cardiovascular Disease

## 2012-05-11 DIAGNOSIS — Z7901 Long term (current) use of anticoagulants: Secondary | ICD-10-CM

## 2012-05-11 DIAGNOSIS — I2699 Other pulmonary embolism without acute cor pulmonale: Secondary | ICD-10-CM

## 2012-05-11 NOTE — Progress Notes (Signed)
  This encounter was created in error - please disregard This encounter was created in error - please disregard. This encounter was created in error - please disregard. This encounter was created in error - please disregard. This encounter was created in error - please disregard. 

## 2012-05-13 IMAGING — CR DG ANKLE PORT 2V*L*
3 series · 3 of 3 positions shown · non-contrast
Comparison: 04/21/2011 radiographs.

CLINICAL DATA: Postop procedure.

PORTABLE LEFT ANKLE - 2 VIEW

[view not recorded (1 of 3)]
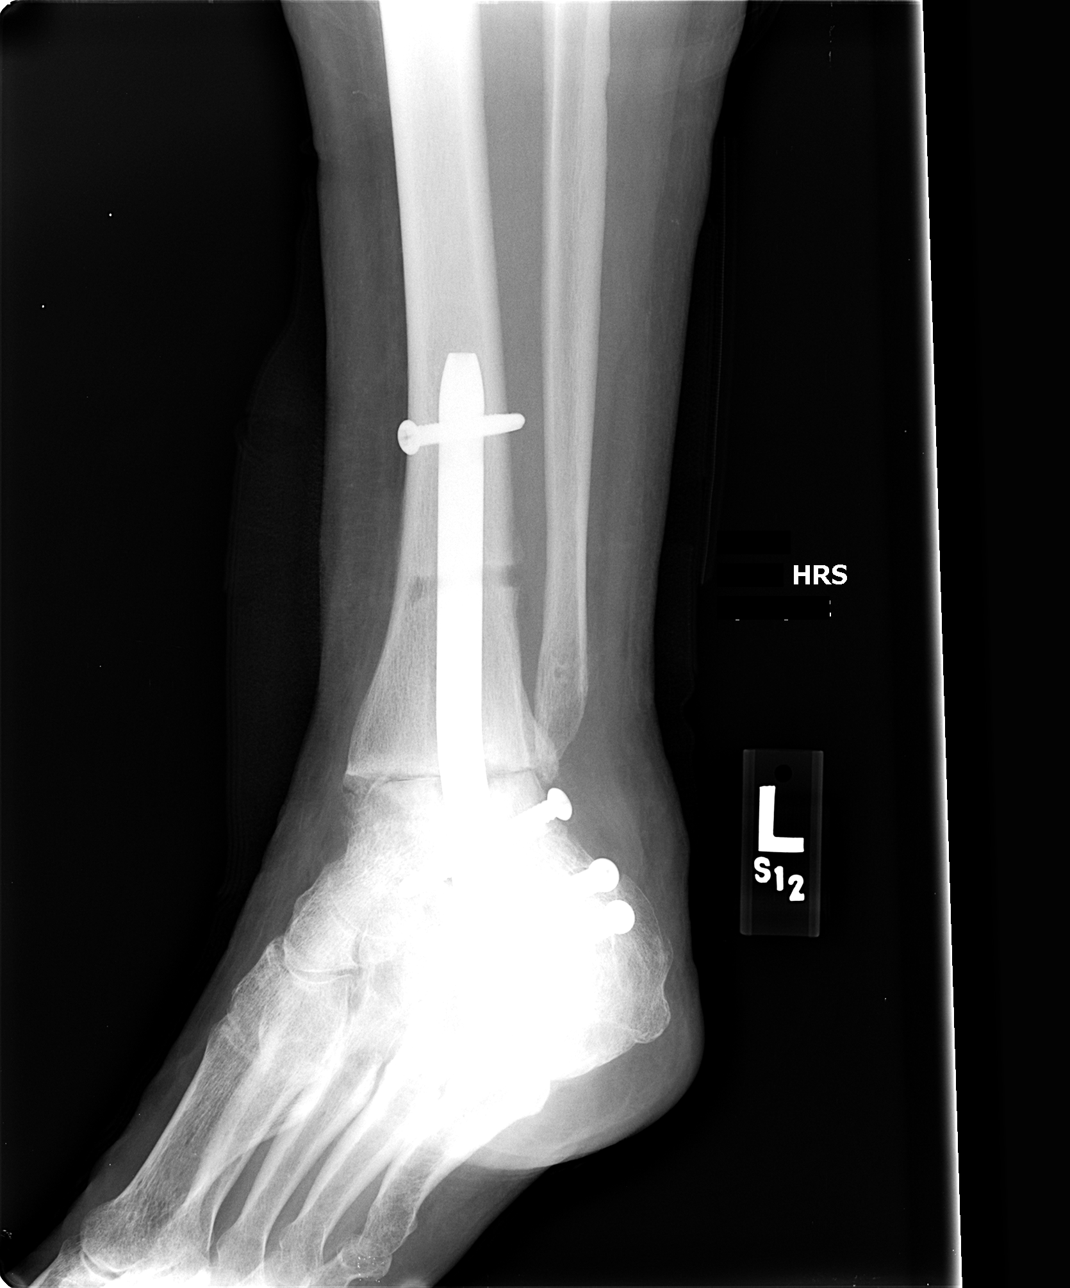

[view not recorded (2 of 3)]
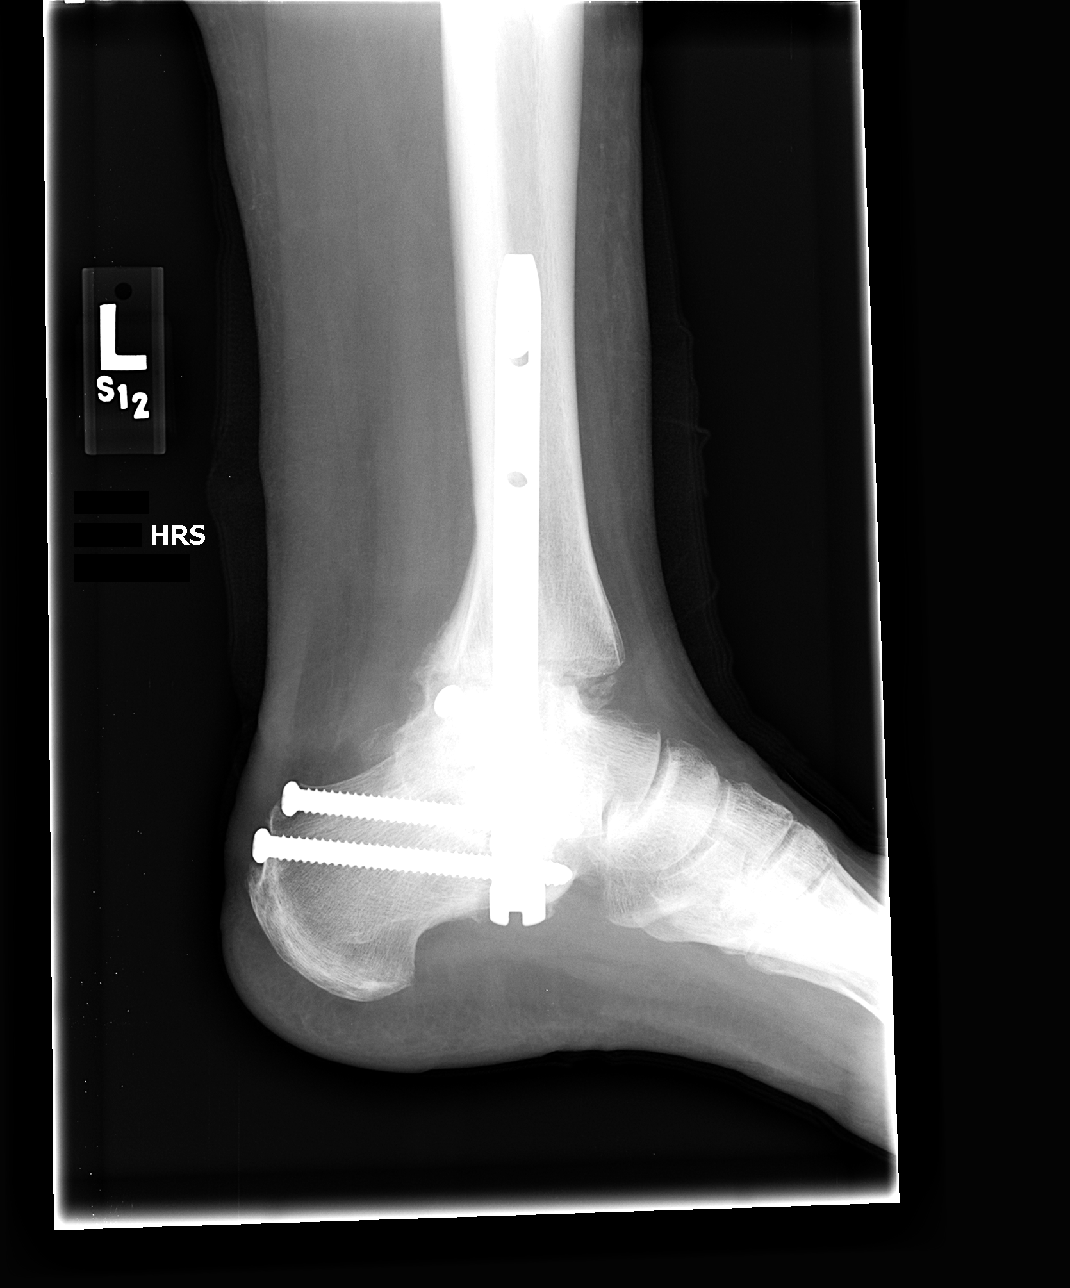

[view not recorded (3 of 3)]
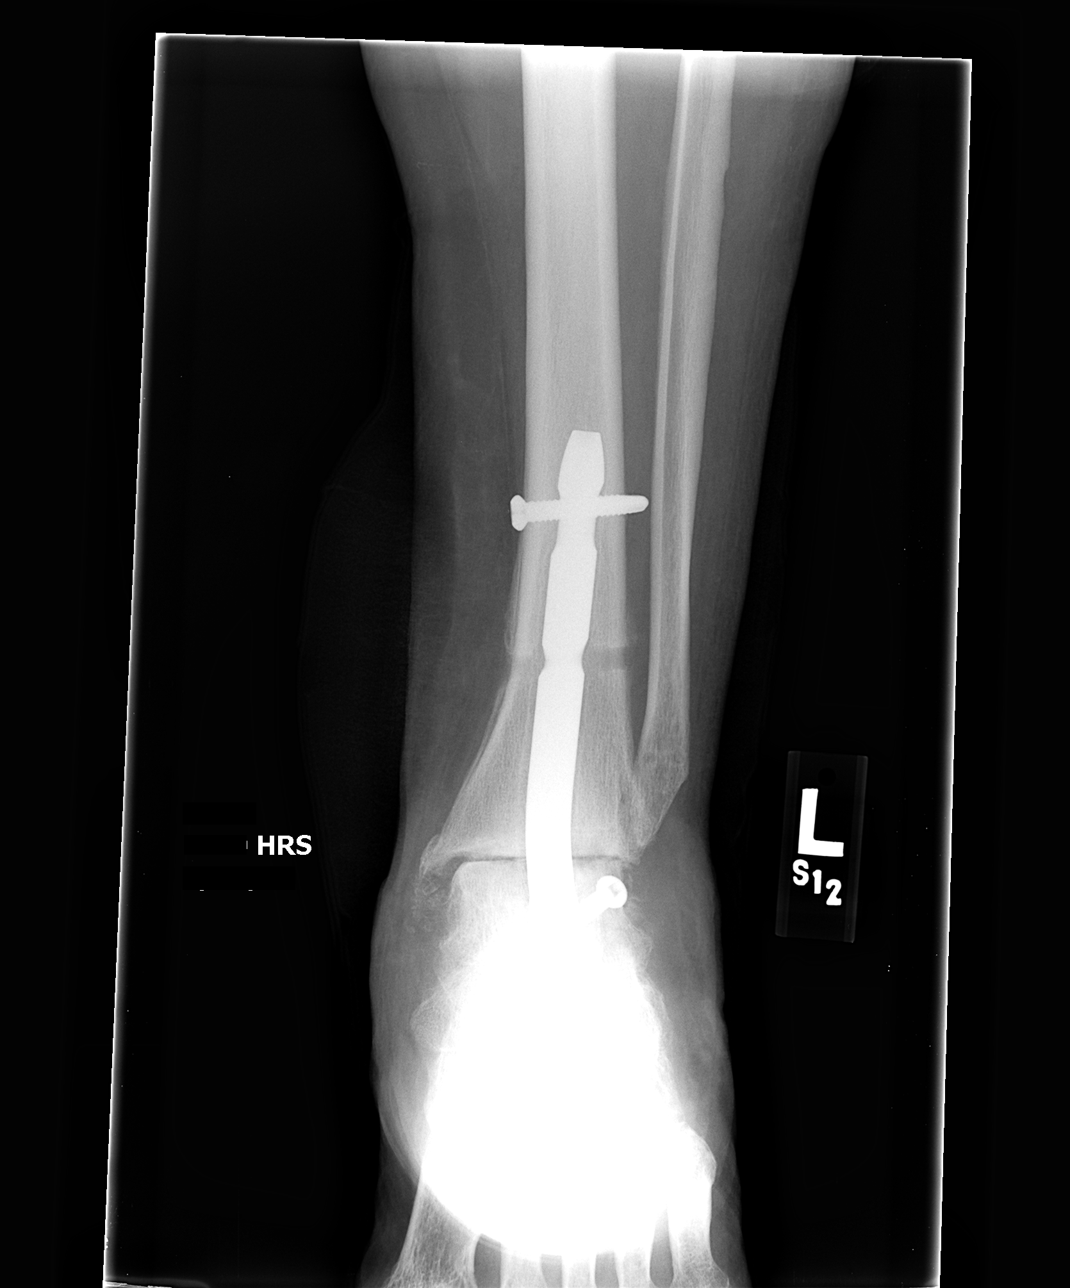

[3 of 3 positions shown; findings below may reference images not displayed]

FINDINGS: Patient is status post tibiotalar and subtalar
arthrodesis using an intramedullary nail.  The second most superior
interlocking screw has been removed in the interval.  The
additional hardware remains.  Minimal loosening of the interlocking
screw in the talus cannot be excluded.  The calcaneal screws appear
well seated.  No solid ankylosis across the tibiotalar joint is
identified.  Distal fibular resection has a stable appearance.
IMPRESSION: 1.  Interval removal of the second most superior tibial
interlocking screw.
2.  Cannot exclude minimal loosening of the interlocking screw
within the talus.
3.  No demonstrated solid ankylosis across the tibiotalar joint.

## 2012-05-17 ENCOUNTER — Ambulatory Visit (INDEPENDENT_AMBULATORY_CARE_PROVIDER_SITE_OTHER): Payer: Worker's Compensation | Admitting: *Deleted

## 2012-05-17 DIAGNOSIS — I2699 Other pulmonary embolism without acute cor pulmonale: Secondary | ICD-10-CM

## 2012-05-17 DIAGNOSIS — Z7901 Long term (current) use of anticoagulants: Secondary | ICD-10-CM

## 2012-05-17 LAB — POCT INR: INR: 3.8

## 2012-05-22 ENCOUNTER — Other Ambulatory Visit: Payer: Self-pay | Admitting: Pulmonary Disease

## 2012-05-28 ENCOUNTER — Ambulatory Visit (INDEPENDENT_AMBULATORY_CARE_PROVIDER_SITE_OTHER): Payer: Worker's Compensation | Admitting: *Deleted

## 2012-05-28 DIAGNOSIS — Z7901 Long term (current) use of anticoagulants: Secondary | ICD-10-CM

## 2012-05-28 DIAGNOSIS — I2699 Other pulmonary embolism without acute cor pulmonale: Secondary | ICD-10-CM

## 2012-05-28 LAB — POCT INR: INR: 1.5

## 2012-06-04 ENCOUNTER — Ambulatory Visit (INDEPENDENT_AMBULATORY_CARE_PROVIDER_SITE_OTHER): Payer: Worker's Compensation | Admitting: *Deleted

## 2012-06-04 DIAGNOSIS — Z7901 Long term (current) use of anticoagulants: Secondary | ICD-10-CM

## 2012-06-04 DIAGNOSIS — I2699 Other pulmonary embolism without acute cor pulmonale: Secondary | ICD-10-CM

## 2012-06-04 LAB — POCT INR: INR: 5.4

## 2012-06-15 ENCOUNTER — Ambulatory Visit (INDEPENDENT_AMBULATORY_CARE_PROVIDER_SITE_OTHER): Payer: Worker's Compensation | Admitting: Pharmacist

## 2012-06-15 DIAGNOSIS — Z7901 Long term (current) use of anticoagulants: Secondary | ICD-10-CM

## 2012-06-15 DIAGNOSIS — I2699 Other pulmonary embolism without acute cor pulmonale: Secondary | ICD-10-CM

## 2012-06-15 LAB — POCT INR: INR: 1.8

## 2012-06-15 MED ORDER — WARFARIN SODIUM 5 MG PO TABS: ORAL_TABLET | ORAL | Status: DC

## 2012-06-21 ENCOUNTER — Other Ambulatory Visit: Payer: Self-pay | Admitting: Pulmonary Disease

## 2012-06-25 ENCOUNTER — Ambulatory Visit (INDEPENDENT_AMBULATORY_CARE_PROVIDER_SITE_OTHER): Payer: Worker's Compensation | Admitting: *Deleted

## 2012-06-25 DIAGNOSIS — I2699 Other pulmonary embolism without acute cor pulmonale: Secondary | ICD-10-CM

## 2012-06-25 DIAGNOSIS — Z7901 Long term (current) use of anticoagulants: Secondary | ICD-10-CM

## 2012-06-25 LAB — POCT INR: INR: 1.7

## 2012-07-05 ENCOUNTER — Ambulatory Visit (INDEPENDENT_AMBULATORY_CARE_PROVIDER_SITE_OTHER): Payer: Worker's Compensation | Admitting: *Deleted

## 2012-07-05 DIAGNOSIS — Z7901 Long term (current) use of anticoagulants: Secondary | ICD-10-CM

## 2012-07-05 DIAGNOSIS — I2699 Other pulmonary embolism without acute cor pulmonale: Secondary | ICD-10-CM

## 2012-07-05 LAB — POCT INR: INR: 2.3

## 2012-07-27 ENCOUNTER — Ambulatory Visit: Payer: Self-pay | Admitting: Pulmonary Disease

## 2012-08-01 ENCOUNTER — Ambulatory Visit (INDEPENDENT_AMBULATORY_CARE_PROVIDER_SITE_OTHER): Payer: Self-pay | Admitting: *Deleted

## 2012-08-01 ENCOUNTER — Telehealth: Payer: Self-pay | Admitting: *Deleted

## 2012-08-01 DIAGNOSIS — Z7901 Long term (current) use of anticoagulants: Secondary | ICD-10-CM

## 2012-08-01 DIAGNOSIS — I2699 Other pulmonary embolism without acute cor pulmonale: Secondary | ICD-10-CM

## 2012-08-01 LAB — POCT INR: INR: 3.4

## 2012-08-01 NOTE — Telephone Encounter (Signed)
Called [employer] to review patients status for lovenox assistance program, they had closed the case but reopened with my phone call, it was approved over  the phone by Ines Bloomer,  lovenox will be shipped within the next few days.  I did notify patient over the phone.

## 2012-08-06 ENCOUNTER — Other Ambulatory Visit: Payer: Self-pay | Admitting: Pulmonary Disease

## 2012-08-09 ENCOUNTER — Other Ambulatory Visit: Payer: Self-pay | Admitting: *Deleted

## 2012-08-09 NOTE — Telephone Encounter (Signed)
received Lovenox today, placed in coumadin clinic.

## 2012-08-10 ENCOUNTER — Telehealth: Payer: Self-pay | Admitting: *Deleted

## 2012-08-10 NOTE — Telephone Encounter (Signed)
Left patient a message on her phone that we have received the lovenox injection in office and she can notify her case manager/phsician to prepare for ankle surgery.

## 2012-09-03 ENCOUNTER — Ambulatory Visit: Payer: Self-pay | Admitting: Pulmonary Disease

## 2012-09-03 ENCOUNTER — Telehealth: Payer: Self-pay | Admitting: *Deleted

## 2012-09-03 NOTE — Telephone Encounter (Signed)
Bridging instructions-left ankle fusion on 09/18/2012  10/23 last dose of coumadin 10/24 no coumadin or lovenox 10/25 lovenox  100 mg 9 am and 9 pm 10/26 lovenox  100 mg 9 am and 9 pm 10/27 lovenox  100 mg 9 am and 9 pm 10/28 lovenox  100 mg 9 am (only morning dose) 10/29 day of surgery, no coumadin or lovenox  Start back on your coumadin when your surgeon states it's OK too, when you start back take an extra 1/2 tablet for 2 days in a row (booster), continue your lovenox until you come back to the coumadin clinic for a check of your INR, we can stop your lovenox when your INR is above 2.0

## 2012-09-03 NOTE — Telephone Encounter (Signed)
Called patient and left a message, she needs appt  at coumadin clinic to review her lovenox briding, she has missed her last 2 appt.

## 2012-09-06 ENCOUNTER — Encounter (HOSPITAL_COMMUNITY): Payer: Self-pay | Admitting: Pharmacy Technician

## 2012-09-07 ENCOUNTER — Ambulatory Visit (INDEPENDENT_AMBULATORY_CARE_PROVIDER_SITE_OTHER): Payer: Worker's Compensation | Admitting: *Deleted

## 2012-09-07 DIAGNOSIS — I2699 Other pulmonary embolism without acute cor pulmonale: Secondary | ICD-10-CM

## 2012-09-07 DIAGNOSIS — Z7901 Long term (current) use of anticoagulants: Secondary | ICD-10-CM

## 2012-09-07 LAB — POCT INR: INR: 1.4

## 2012-09-07 NOTE — Patient Instructions (Addendum)
Bridging instructions-left ankle fusion on 09/18/2012  10/23 last dose of coumadin  10/24 no coumadin or lovenox  10/25 lovenox 100 mg 9 am and 9 pm  10/26 lovenox 100 mg 9 am and 9 pm  10/27 lovenox 100 mg 9 am and 9 pm  10/28 lovenox 100 mg 9 am (only morning dose)  10/29 day of surgery, no coumadin or lovenox   Will be inpatient for approximately 3 days

## 2012-09-10 ENCOUNTER — Encounter (HOSPITAL_COMMUNITY)
Admission: RE | Admit: 2012-09-10 | Discharge: 2012-09-10 | Payer: Worker's Compensation | Source: Ambulatory Visit | Attending: Orthopedic Surgery | Admitting: Orthopedic Surgery

## 2012-09-10 NOTE — Pre-Procedure Instructions (Signed)
20 KEYLI DUROSS  09/10/2012   Your procedure is scheduled on:  Tues, Oct 29 @ 7:30 AM  Report to [facility] at 5:30 AM.  Call this number if you have problems the morning of surgery: [phone]   Remember:   Do not eat food:After Midnight.    Take these medicines the morning of surgery with A SIP OF WATER: Pain Pill(if needed) and Pregabalin(Lyrica)   Do not wear jewelry, make-up or nail polish.  Do not wear lotions, powders, or perfumes. You may wear deodorant.  Do not shave 48 hours prior to surgery.  Do not bring valuables to the hospital.  Contacts, dentures or bridgework may not be worn into surgery.  Leave suitcase in the car. After surgery it may be brought to your room.  For patients admitted to the hospital, checkout time is 11:00 AM the day of discharge.   Patients discharged the day of surgery will not be allowed to drive home.    Special Instructions: Shower using CHG 2 nights before surgery and the night before surgery.  If you shower the day of surgery use CHG.  Use special wash - you have one bottle of CHG for all showers.  You should use approximately 1/3 of the bottle for each shower.   Please read over the following fact sheets that you were given: Pain Booklet, Coughing and Deep Breathing, Blood Transfusion Information, MRSA Information and Surgical Site Infection Prevention

## 2012-09-11 ENCOUNTER — Other Ambulatory Visit: Payer: Self-pay | Admitting: Pulmonary Disease

## 2012-09-14 ENCOUNTER — Telehealth: Payer: Self-pay | Admitting: Pharmacist

## 2012-09-14 NOTE — Telephone Encounter (Signed)
Patient's surgery date got moved to 11/5 (from 10/29), and she wanted advice on her Lovenox bridging.  Her last dose of Coumadin was 10/23, and her first dose of Lovenox was this morning. INR on 10/18 was 1.4.    Advised patient to continue her Lovenox injections today and tomorrow, and to resume her Coumadin tonight at double her regular dose today and tomorrow. Then, resume regular Coumadin dose with no Lovenox.  Restart Lovenox bridge for new surgery dates:  10/30 - last dose Coumadin 10/31 - no Lovenox or Coumadin 11/1 - 100 mg Lovenox at 9 am and 9 pm 11/2 - 100 mg Lovenox at 9 am and 9 pm 11/3 - 100 mg Lovenox at 9 am and 9 pm 11/4 - 100 mg Lovenox at 9 am (morning dose only) 11/5 - day of surgery - no Lovenox or Coumadin

## 2012-09-17 ENCOUNTER — Encounter (HOSPITAL_COMMUNITY)
Admission: RE | Admit: 2012-09-17 | Discharge: 2012-09-17 | Disposition: A | Discharge status: Home / Self Care | Payer: Worker's Compensation | Source: Ambulatory Visit | Attending: Orthopedic Surgery | Admitting: Orthopedic Surgery

## 2012-09-17 ENCOUNTER — Encounter (HOSPITAL_COMMUNITY): Payer: Self-pay

## 2012-09-17 LAB — TYPE AND SCREEN
ABO/RH(D): B POS
Antibody Screen: NEGATIVE

## 2012-09-17 LAB — CBC WITH DIFFERENTIAL/PLATELET
Basophils Absolute: 0.1 10*3/uL (ref 0.0–0.1)
Basophils Relative: 1 % (ref 0–1)
Eosinophils Absolute: 0.1 10*3/uL (ref 0.0–0.7)
Eosinophils Relative: 1 % (ref 0–5)
HCT: 42.5 % (ref 36.0–46.0)
Hemoglobin: 14 g/dL (ref 12.0–15.0)
Lymphocytes Relative: 30 % (ref 12–46)
Lymphs Abs: 3.8 10*3/uL (ref 0.7–4.0)
MCH: 27.1 pg (ref 26.0–34.0)
MCHC: 32.9 g/dL (ref 30.0–36.0)
MCV: 82.4 fL (ref 78.0–100.0)
Monocytes Absolute: 1.1 10*3/uL — ABNORMAL HIGH (ref 0.1–1.0)
Monocytes Relative: 9 % (ref 3–12)
Neutro Abs: 7.7 10*3/uL (ref 1.7–7.7)
Neutrophils Relative %: 60 % (ref 43–77)
Platelets: 383 10*3/uL (ref 150–400)
RBC: 5.16 MIL/uL — ABNORMAL HIGH (ref 3.87–5.11)
RDW: 15.4 % (ref 11.5–15.5)
WBC: 12.4 10*3/uL — ABNORMAL HIGH (ref 4.0–10.5)

## 2012-09-17 LAB — COMPREHENSIVE METABOLIC PANEL
ALT: 17 U/L (ref 0–35)
AST: 16 U/L (ref 0–37)
Albumin: 3.9 g/dL (ref 3.5–5.2)
Alkaline Phosphatase: 81 U/L (ref 39–117)
BUN: 19 mg/dL (ref 6–23)
CO2: 28 mEq/L (ref 19–32)
Calcium: 9.7 mg/dL (ref 8.4–10.5)
Chloride: 103 mEq/L (ref 96–112)
Creatinine, Ser: 0.77 mg/dL (ref 0.50–1.10)
GFR calc Af Amer: >90 mL/min (ref >90)
GFR calc non Af Amer: >90 mL/min (ref >90)
Glucose, Bld: 95 mg/dL (ref 70–99)
Potassium: 4.2 mEq/L (ref 3.5–5.1)
Sodium: 139 mEq/L (ref 135–145)
Total Bilirubin: 0.3 mg/dL (ref 0.3–1.2)
Total Protein: 7.6 g/dL (ref 6.0–8.3)

## 2012-09-17 LAB — SURGICAL PCR SCREEN
MRSA, PCR: NEGATIVE
Staphylococcus aureus: POSITIVE — AB

## 2012-09-17 LAB — SEDIMENTATION RATE: Sed Rate: 1 mm/hr (ref 0–22)

## 2012-09-17 LAB — PROTIME-INR
INR: 1.72 — ABNORMAL HIGH (ref 0.00–1.49)
Prothrombin Time: 19.6 seconds — ABNORMAL HIGH (ref 11.6–15.2)

## 2012-09-17 LAB — HCG, SERUM, QUALITATIVE: Preg, Serum: NEGATIVE

## 2012-09-17 LAB — APTT: aPTT: 37 seconds (ref 24–37)

## 2012-09-17 LAB — C-REACTIVE PROTEIN: CRP: <0.5 mg/dL — ABNORMAL LOW (ref <0.60)

## 2012-09-17 NOTE — Pre-Procedure Instructions (Signed)
20 SENITA CORREDOR  09/17/2012   Your procedure is scheduled on:  Tues, November 5 @7 :30 AM  Report to [facility] at 5:30 AM.  Call this number if you have problems the morning of surgery: [phone]   Remember:   Do not eat food:After Midnight.    Take these medicines the morning of surgery with A SIP OF WATER: Pain Pill(if needed) and Pregabalin(Lyrica)   Do not wear jewelry, make-up or nail polish.  Do not wear lotions, powders, or perfumes. You may wear deodorant.  Do not shave 48 hours prior to surgery.  Do not bring valuables to the hospital.  Contacts, dentures or bridgework may not be worn into surgery.  Leave suitcase in the car. After surgery it may be brought to your room.  For patients admitted to the hospital, checkout time is 11:00 AM the day of discharge.   Patients discharged the day of surgery will not be allowed to drive home.    Special Instructions: Incentive Spirometry - Practice and bring it with you on the day of surgery. Shower using CHG 2 nights before surgery and the night before surgery.  If you shower the day of surgery use CHG.  Use special wash - you have one bottle of CHG for all showers.  You should use approximately 1/3 of the bottle for each shower.   Please read over the following fact sheets that you were given: Pain Booklet, Coughing and Deep Breathing, Blood Transfusion Information, MRSA Information and Surgical Site Infection Prevention

## 2012-09-17 NOTE — Progress Notes (Addendum)
Pt unable to give urine sample at PAT, will need UA and nicotine DOS.   Chart left for anesthesia review of labs - PT/INR elevated.

## 2012-09-18 ENCOUNTER — Other Ambulatory Visit: Payer: Self-pay | Admitting: Pulmonary Disease
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
## 2012-09-24 MED ORDER — CEFAZOLIN SODIUM-DEXTROSE 2-3 GM-% IV SOLR
2.0000 g | INTRAVENOUS | Status: AC
  Administered 2012-09-25: 2 g via INTRAVENOUS
  Filled 2012-09-24: qty 50

## 2012-09-25 ENCOUNTER — Ambulatory Visit (HOSPITAL_COMMUNITY): Payer: Worker's Compensation

## 2012-09-25 ENCOUNTER — Encounter (HOSPITAL_COMMUNITY)
Admission: RE | Disposition: A | Discharge status: Home / Self Care | Payer: Self-pay | Source: Ambulatory Visit | Attending: Orthopedic Surgery

## 2012-09-25 ENCOUNTER — Encounter (HOSPITAL_COMMUNITY): Payer: Self-pay | Admitting: Vascular Surgery

## 2012-09-25 ENCOUNTER — Observation Stay (HOSPITAL_COMMUNITY)
Admission: RE | Admit: 2012-09-25 | Discharge: 2012-09-27 | Disposition: A | Discharge status: Home / Self Care | Payer: Worker's Compensation | Source: Ambulatory Visit | Attending: Orthopedic Surgery | Admitting: Orthopedic Surgery

## 2012-09-25 ENCOUNTER — Encounter (HOSPITAL_COMMUNITY): Payer: Self-pay | Admitting: *Deleted

## 2012-09-25 ENCOUNTER — Ambulatory Visit (HOSPITAL_COMMUNITY): Payer: Worker's Compensation | Admitting: Vascular Surgery

## 2012-09-25 ENCOUNTER — Observation Stay (HOSPITAL_COMMUNITY): Payer: Worker's Compensation

## 2012-09-25 DIAGNOSIS — D689 Coagulation defect, unspecified: Secondary | ICD-10-CM

## 2012-09-25 DIAGNOSIS — Y831 Surgical operation with implant of artificial internal device as the cause of abnormal reaction of the patient, or of later complication, without mention of misadventure at the time of the procedure: Secondary | ICD-10-CM | POA: Insufficient documentation

## 2012-09-25 DIAGNOSIS — M329 Systemic lupus erythematosus, unspecified: Secondary | ICD-10-CM | POA: Insufficient documentation

## 2012-09-25 DIAGNOSIS — F17200 Nicotine dependence, unspecified, uncomplicated: Secondary | ICD-10-CM | POA: Insufficient documentation

## 2012-09-25 DIAGNOSIS — Z7901 Long term (current) use of anticoagulants: Secondary | ICD-10-CM

## 2012-09-25 DIAGNOSIS — G8929 Other chronic pain: Secondary | ICD-10-CM

## 2012-09-25 DIAGNOSIS — T84498A Other mechanical complication of other internal orthopedic devices, implants and grafts, initial encounter: Principal | ICD-10-CM | POA: Insufficient documentation

## 2012-09-25 DIAGNOSIS — M869 Osteomyelitis, unspecified: Secondary | ICD-10-CM | POA: Diagnosis present

## 2012-09-25 DIAGNOSIS — IMO0002 Reserved for concepts with insufficient information to code with codable children: Secondary | ICD-10-CM | POA: Diagnosis present

## 2012-09-25 DIAGNOSIS — Z86711 Personal history of pulmonary embolism: Secondary | ICD-10-CM | POA: Insufficient documentation

## 2012-09-25 HISTORY — PX: HARDWARE REMOVAL: SHX979

## 2012-09-25 HISTORY — DX: Osteomyelitis, unspecified: M86.9

## 2012-09-25 HISTORY — DX: Reserved for concepts with insufficient information to code with codable children: IMO0002

## 2012-09-25 HISTORY — PX: TIBIA OSTEOTOMY: SHX1065

## 2012-09-25 LAB — PROTIME-INR
INR: 0.96 (ref 0.00–1.49)
Prothrombin Time: 12.7 seconds (ref 11.6–15.2)

## 2012-09-25 LAB — GRAM STAIN

## 2012-09-25 LAB — URINALYSIS, ROUTINE W REFLEX MICROSCOPIC
Bilirubin Urine: NEGATIVE
Glucose, UA: NEGATIVE mg/dL
Hgb urine dipstick: NEGATIVE
Ketones, ur: NEGATIVE mg/dL
Leukocytes, UA: NEGATIVE
Nitrite: NEGATIVE
Protein, ur: NEGATIVE mg/dL
Specific Gravity, Urine: 1.013 (ref 1.005–1.030)
Urobilinogen, UA: 0.2 mg/dL (ref 0.0–1.0)
pH: 5.5 (ref 5.0–8.0)

## 2012-09-25 LAB — APTT: aPTT: 32 seconds (ref 24–37)

## 2012-09-25 SURGERY — REMOVAL, HARDWARE
Anesthesia: General | Site: Ankle | Laterality: Left | Wound class: Dirty or Infected

## 2012-09-25 MED ORDER — PROMETHAZINE HCL 25 MG/ML IJ SOLN: 6.2500 mg | INTRAMUSCULAR | Status: DC | PRN

## 2012-09-25 MED ORDER — VANCOMYCIN HCL 1000 MG IV SOLR
2500.0000 mg | Freq: Once | INTRAVENOUS | Status: AC
  Administered 2012-09-25: 2500 mg via INTRAVENOUS
  Filled 2012-09-25 (×2): qty 2500

## 2012-09-25 MED ORDER — ARTIFICIAL TEARS OP OINT
TOPICAL_OINTMENT | OPHTHALMIC | Status: DC | PRN
  Administered 2012-09-25: 1 via OPHTHALMIC

## 2012-09-25 MED ORDER — OXYCODONE HCL ER 15 MG PO T12A
40.0000 mg | EXTENDED_RELEASE_TABLET | Freq: Two times a day (BID) | ORAL | Status: DC
  Administered 2012-09-25 – 2012-09-27 (×5): 40 mg via ORAL
  Filled 2012-09-25 (×5): qty 2

## 2012-09-25 MED ORDER — POTASSIUM CHLORIDE IN NACL 20-0.9 MEQ/L-% IV SOLN
INTRAVENOUS | Status: DC
  Administered 2012-09-26: 50 mL/h via INTRAVENOUS
  Filled 2012-09-25 (×5): qty 1000

## 2012-09-25 MED ORDER — MORPHINE SULFATE 2 MG/ML IJ SOLN
2.0000 mg | INTRAMUSCULAR | Status: DC | PRN
  Administered 2012-09-25: 3 mg via INTRAVENOUS

## 2012-09-25 MED ORDER — OXYCODONE HCL 5 MG PO TABS
ORAL_TABLET | ORAL | Status: AC
  Filled 2012-09-25: qty 2

## 2012-09-25 MED ORDER — METHOCARBAMOL 500 MG PO TABS
1000.0000 mg | ORAL_TABLET | Freq: Four times a day (QID) | ORAL | Status: DC
  Administered 2012-09-25 – 2012-09-27 (×7): 1000 mg via ORAL
  Filled 2012-09-25 (×11): qty 2
  Filled 2012-09-25: qty 1
  Filled 2012-09-25: qty 2

## 2012-09-25 MED ORDER — MAGNESIUM HYDROXIDE 400 MG/5ML PO SUSP
30.0000 mL | Freq: Every day | ORAL | Status: DC | PRN
  Administered 2012-09-26: 30 mL via ORAL
  Filled 2012-09-25: qty 30

## 2012-09-25 MED ORDER — BISACODYL 5 MG PO TBEC
5.0000 mg | DELAYED_RELEASE_TABLET | Freq: Every day | ORAL | Status: DC | PRN
  Administered 2012-09-26: 5 mg via ORAL
  Filled 2012-09-25: qty 1

## 2012-09-25 MED ORDER — MIDAZOLAM HCL 2 MG/2ML IJ SOLN
INTRAMUSCULAR | Status: AC
  Filled 2012-09-25: qty 2

## 2012-09-25 MED ORDER — HYDROMORPHONE HCL PF 1 MG/ML IJ SOLN
INTRAMUSCULAR | Status: DC | PRN
  Administered 2012-09-25 (×2): 0.5 mg via INTRAVENOUS

## 2012-09-25 MED ORDER — VANCOMYCIN HCL IN DEXTROSE 1-5 GM/200ML-% IV SOLN
1000.0000 mg | Freq: Two times a day (BID) | INTRAVENOUS | Status: DC
  Administered 2012-09-26 – 2012-09-27 (×3): 1000 mg via INTRAVENOUS
  Filled 2012-09-25 (×6): qty 200

## 2012-09-25 MED ORDER — OXYCODONE HCL 5 MG/5ML PO SOLN: 5.0000 mg | Freq: Once | ORAL | Status: DC | PRN

## 2012-09-25 MED ORDER — PROPOFOL 10 MG/ML IV BOLUS
INTRAVENOUS | Status: DC | PRN
  Administered 2012-09-25: 20 mg via INTRAVENOUS
  Administered 2012-09-25: 200 mg via INTRAVENOUS

## 2012-09-25 MED ORDER — FENTANYL CITRATE 0.05 MG/ML IJ SOLN
INTRAMUSCULAR | Status: DC | PRN
  Administered 2012-09-25 (×2): 50 ug via INTRAVENOUS
  Administered 2012-09-25: 150 ug via INTRAVENOUS
  Administered 2012-09-25 (×5): 50 ug via INTRAVENOUS

## 2012-09-25 MED ORDER — HYDROMORPHONE HCL PF 1 MG/ML IJ SOLN
1.0000 mg | INTRAMUSCULAR | Status: DC | PRN
Start: 2012-09-25 — End: 2012-09-27
  Administered 2012-09-25 – 2012-09-27 (×14): 1 mg via INTRAVENOUS
  Filled 2012-09-25 (×15): qty 1

## 2012-09-25 MED ORDER — ROCURONIUM BROMIDE 100 MG/10ML IV SOLN
INTRAVENOUS | Status: DC | PRN
  Administered 2012-09-25: 10 mg via INTRAVENOUS
  Administered 2012-09-25: 50 mg via INTRAVENOUS
  Administered 2012-09-25: 10 mg via INTRAVENOUS

## 2012-09-25 MED ORDER — METHOCARBAMOL 500 MG PO TABS
ORAL_TABLET | ORAL | Status: AC
  Filled 2012-09-25: qty 1

## 2012-09-25 MED ORDER — METHOCARBAMOL 100 MG/ML IJ SOLN
500.0000 mg | Freq: Four times a day (QID) | INTRAVENOUS | Status: DC
  Administered 2012-09-25 – 2012-09-26 (×4): 500 mg via INTRAVENOUS
  Filled 2012-09-25 (×3): qty 5

## 2012-09-25 MED ORDER — HYDROMORPHONE HCL PF 1 MG/ML IJ SOLN
1.0000 mg | INTRAMUSCULAR | Status: DC | PRN
  Administered 2012-09-25 (×2): 1 mg via INTRAVENOUS
  Filled 2012-09-25 (×3): qty 1

## 2012-09-25 MED ORDER — LIDOCAINE HCL (CARDIAC) 20 MG/ML IV SOLN
INTRAVENOUS | Status: DC | PRN
  Administered 2012-09-25: 50 mg via INTRAVENOUS

## 2012-09-25 MED ORDER — DOCUSATE SODIUM 100 MG PO CAPS
100.0000 mg | ORAL_CAPSULE | Freq: Two times a day (BID) | ORAL | Status: DC
  Administered 2012-09-25 – 2012-09-27 (×5): 100 mg via ORAL
  Filled 2012-09-25 (×5): qty 1

## 2012-09-25 MED ORDER — MIDAZOLAM HCL 2 MG/2ML IJ SOLN
0.5000 mg | Freq: Once | INTRAMUSCULAR | Status: AC | PRN
  Administered 2012-09-25: 2 mg via INTRAVENOUS

## 2012-09-25 MED ORDER — PREGABALIN 50 MG PO CAPS
150.0000 mg | ORAL_CAPSULE | Freq: Two times a day (BID) | ORAL | Status: DC
Start: 2012-09-25 — End: 2012-09-25

## 2012-09-25 MED ORDER — ONDANSETRON HCL 4 MG PO TABS: 4.0000 mg | ORAL_TABLET | Freq: Four times a day (QID) | ORAL | Status: DC | PRN

## 2012-09-25 MED ORDER — HYDROMORPHONE HCL PF 1 MG/ML IJ SOLN
INTRAMUSCULAR | Status: AC
  Filled 2012-09-25: qty 1

## 2012-09-25 MED ORDER — LEVOFLOXACIN 750 MG PO TABS
750.0000 mg | ORAL_TABLET | Freq: Every day | ORAL | Status: DC
  Administered 2012-09-25 – 2012-09-27 (×3): 750 mg via ORAL
  Filled 2012-09-25 (×3): qty 1

## 2012-09-25 MED ORDER — LACTATED RINGERS IV SOLN: INTRAVENOUS | Status: DC

## 2012-09-25 MED ORDER — MAGNESIUM CITRATE PO SOLN
1.0000 | Freq: Once | ORAL | Status: AC | PRN
  Filled 2012-09-25: qty 296

## 2012-09-25 MED ORDER — LACTATED RINGERS IV SOLN
INTRAVENOUS | Status: DC | PRN
  Administered 2012-09-25 (×3): via INTRAVENOUS

## 2012-09-25 MED ORDER — METHOCARBAMOL 500 MG PO TABS
1000.0000 mg | ORAL_TABLET | Freq: Four times a day (QID) | ORAL | Status: DC
  Administered 2012-09-25: 1000 mg via ORAL

## 2012-09-25 MED ORDER — DIPHENHYDRAMINE HCL 12.5 MG/5ML PO ELIX: 12.5000 mg | ORAL_SOLUTION | ORAL | Status: DC | PRN

## 2012-09-25 MED ORDER — ONDANSETRON HCL 4 MG/2ML IJ SOLN: 4.0000 mg | Freq: Four times a day (QID) | INTRAMUSCULAR | Status: DC | PRN

## 2012-09-25 MED ORDER — INFLUENZA VIRUS VACC SPLIT PF IM SUSP
0.5000 mL | INTRAMUSCULAR | Status: AC
  Administered 2012-09-26: 0.5 mL via INTRAMUSCULAR
  Filled 2012-09-25 (×2): qty 0.5

## 2012-09-25 MED ORDER — OXYCODONE HCL 5 MG PO TABS
10.0000 mg | ORAL_TABLET | ORAL | Status: DC | PRN
  Administered 2012-09-25 – 2012-09-27 (×10): 20 mg via ORAL
  Filled 2012-09-25 (×9): qty 4

## 2012-09-25 MED ORDER — 0.9 % SODIUM CHLORIDE (POUR BTL) OPTIME
TOPICAL | Status: DC | PRN
  Administered 2012-09-25 (×2): 1000 mL

## 2012-09-25 MED ORDER — CHLORHEXIDINE GLUCONATE 4 % EX LIQD: 60.0000 mL | Freq: Once | CUTANEOUS | Status: DC

## 2012-09-25 MED ORDER — HYDROMORPHONE HCL PF 1 MG/ML IJ SOLN
0.2500 mg | INTRAMUSCULAR | Status: DC | PRN
  Administered 2012-09-25: 1 mg via INTRAVENOUS
  Administered 2012-09-25 (×2): 0.5 mg via INTRAVENOUS

## 2012-09-25 MED ORDER — OXYCODONE HCL 5 MG PO TABS: 5.0000 mg | ORAL_TABLET | Freq: Once | ORAL | Status: DC | PRN

## 2012-09-25 MED ORDER — GLYCOPYRROLATE 0.2 MG/ML IJ SOLN
INTRAMUSCULAR | Status: DC | PRN
  Administered 2012-09-25: 0.4 mg via INTRAVENOUS

## 2012-09-25 MED ORDER — MORPHINE SULFATE 4 MG/ML IJ SOLN
INTRAMUSCULAR | Status: AC
  Filled 2012-09-25: qty 1

## 2012-09-25 MED ORDER — METOCLOPRAMIDE HCL 10 MG PO TABS: 5.0000 mg | ORAL_TABLET | Freq: Three times a day (TID) | ORAL | Status: DC | PRN

## 2012-09-25 MED ORDER — ACETAMINOPHEN 10 MG/ML IV SOLN
1000.0000 mg | Freq: Four times a day (QID) | INTRAVENOUS | Status: AC
  Administered 2012-09-25 – 2012-09-26 (×4): 1000 mg via INTRAVENOUS
  Filled 2012-09-25 (×4): qty 100

## 2012-09-25 MED ORDER — MEPERIDINE HCL 25 MG/ML IJ SOLN: 6.2500 mg | INTRAMUSCULAR | Status: DC | PRN

## 2012-09-25 MED ORDER — ENOXAPARIN SODIUM 100 MG/ML ~~LOC~~ SOLN
100.0000 mg | Freq: Two times a day (BID) | SUBCUTANEOUS | Status: DC
  Administered 2012-09-25 – 2012-09-27 (×5): 100 mg via SUBCUTANEOUS
  Filled 2012-09-25 (×7): qty 1

## 2012-09-25 MED ORDER — OXYCODONE HCL 40 MG PO TB12: 40.0000 mg | ORAL_TABLET | Freq: Two times a day (BID) | ORAL | Status: DC

## 2012-09-25 MED ORDER — ONDANSETRON HCL 4 MG/2ML IJ SOLN
INTRAMUSCULAR | Status: DC | PRN
  Administered 2012-09-25: 4 mg via INTRAVENOUS

## 2012-09-25 MED ORDER — VANCOMYCIN HCL IN DEXTROSE 1-5 GM/200ML-% IV SOLN
1000.0000 mg | Freq: Two times a day (BID) | INTRAVENOUS | Status: DC
  Filled 2012-09-25 (×2): qty 200

## 2012-09-25 MED ORDER — METOCLOPRAMIDE HCL 5 MG/ML IJ SOLN: 5.0000 mg | Freq: Three times a day (TID) | INTRAMUSCULAR | Status: DC | PRN

## 2012-09-25 MED ORDER — PREGABALIN 50 MG PO CAPS
150.0000 mg | ORAL_CAPSULE | Freq: Two times a day (BID) | ORAL | Status: DC
  Administered 2012-09-25 – 2012-09-27 (×5): 150 mg via ORAL
  Filled 2012-09-25 (×5): qty 3

## 2012-09-25 MED ORDER — NEOSTIGMINE METHYLSULFATE 1 MG/ML IJ SOLN
INTRAMUSCULAR | Status: DC | PRN
  Administered 2012-09-25: 4 mg via INTRAVENOUS

## 2012-09-25 MED ORDER — MIDAZOLAM HCL 5 MG/5ML IJ SOLN
INTRAMUSCULAR | Status: DC | PRN
  Administered 2012-09-25 (×2): 2 mg via INTRAVENOUS

## 2012-09-25 MED ORDER — HYDROMORPHONE HCL PF 1 MG/ML IJ SOLN
0.5000 mg | INTRAMUSCULAR | Status: DC | PRN
  Administered 2012-09-25: 0.5 mg via INTRAVENOUS

## 2012-09-25 SURGICAL SUPPLY — 90 items
BANDAGE ELASTIC 4 VELCRO ST LF (GAUZE/BANDAGES/DRESSINGS) ×4 IMPLANT
BANDAGE ELASTIC 6 VELCRO ST LF (GAUZE/BANDAGES/DRESSINGS) ×4 IMPLANT
BANDAGE ESMARK 6X9 LF (GAUZE/BANDAGES/DRESSINGS) ×3 IMPLANT
BANDAGE GAUZE ELAST BULKY 4 IN (GAUZE/BANDAGES/DRESSINGS) ×4 IMPLANT
BLADE SURG 10 STRL SS (BLADE) ×8 IMPLANT
BLADE SURG 15 STRL LF DISP TIS (BLADE) IMPLANT
BLADE SURG 15 STRL SS (BLADE)
BNDG COHESIVE 4X5 TAN STRL (GAUZE/BANDAGES/DRESSINGS) IMPLANT
BNDG COHESIVE 6X5 TAN STRL LF (GAUZE/BANDAGES/DRESSINGS) IMPLANT
BNDG ESMARK 6X9 LF (GAUZE/BANDAGES/DRESSINGS) ×4
BONE CEMENT PALACOS W/GENTAMIC (Orthopedic Implant) ×4 IMPLANT
BRUSH SCRUB DISP (MISCELLANEOUS) ×8 IMPLANT
CANISTER SUCTION 2500CC (MISCELLANEOUS) ×4 IMPLANT
CATH THORACIC 36FR (CATHETERS) ×4 IMPLANT
CLEANER TIP ELECTROSURG 2X2 (MISCELLANEOUS) IMPLANT
CLOTH BEACON ORANGE TIMEOUT ST (SAFETY) ×4 IMPLANT
CONT SPEC 4OZ CLIKSEAL STRL BL (MISCELLANEOUS) ×4 IMPLANT
COVER MAYO STAND STRL (DRAPES) IMPLANT
COVER SURGICAL LIGHT HANDLE (MISCELLANEOUS) ×4 IMPLANT
CUFF TOURNIQUET SINGLE 18IN (TOURNIQUET CUFF) IMPLANT
CUFF TOURNIQUET SINGLE 24IN (TOURNIQUET CUFF) IMPLANT
CUFF TOURNIQUET SINGLE 34IN LL (TOURNIQUET CUFF) ×4 IMPLANT
DRAPE C-ARM 42X72 X-RAY (DRAPES) ×4 IMPLANT
DRAPE C-ARMOR (DRAPES) ×4 IMPLANT
DRAPE OEC MINIVIEW 54X84 (DRAPES) IMPLANT
DRAPE ORTHO SPLIT 77X108 STRL (DRAPES)
DRAPE SURG ORHT 6 SPLT 77X108 (DRAPES) IMPLANT
DRAPE U-SHAPE 47X51 STRL (DRAPES) ×4 IMPLANT
DRSG ADAPTIC 3X8 NADH LF (GAUZE/BANDAGES/DRESSINGS) ×4 IMPLANT
DRSG EMULSION OIL 3X3 NADH (GAUZE/BANDAGES/DRESSINGS) IMPLANT
DRSG PAD ABDOMINAL 8X10 ST (GAUZE/BANDAGES/DRESSINGS) ×8 IMPLANT
ELECT CAUTERY BLADE 6.4 (BLADE) ×4 IMPLANT
ELECT REM PT RETURN 9FT ADLT (ELECTROSURGICAL) ×4
ELECTRODE REM PT RTRN 9FT ADLT (ELECTROSURGICAL) ×3 IMPLANT
EVACUATOR 1/8 PVC DRAIN (DRAIN) IMPLANT
GLOVE BIO SURGEON STRL SZ7.5 (GLOVE) ×16 IMPLANT
GLOVE BIO SURGEON STRL SZ8 (GLOVE) ×20 IMPLANT
GLOVE BIOGEL PI IND STRL 7.0 (GLOVE) ×6 IMPLANT
GLOVE BIOGEL PI IND STRL 7.5 (GLOVE) ×6 IMPLANT
GLOVE BIOGEL PI IND STRL 8 (GLOVE) ×3 IMPLANT
GLOVE BIOGEL PI IND STRL 8.5 (GLOVE) ×3 IMPLANT
GLOVE BIOGEL PI INDICATOR 7.0 (GLOVE) ×2
GLOVE BIOGEL PI INDICATOR 7.5 (GLOVE) ×2
GLOVE BIOGEL PI INDICATOR 8 (GLOVE) ×1
GLOVE BIOGEL PI INDICATOR 8.5 (GLOVE) ×1
GLOVE ECLIPSE 7.0 STRL STRAW (GLOVE) ×4 IMPLANT
GLOVE SURG SS PI 6.5 STRL IVOR (GLOVE) ×12 IMPLANT
GOWN PREVENTION PLUS XLARGE (GOWN DISPOSABLE) ×4 IMPLANT
GOWN STRL NON-REIN LRG LVL3 (GOWN DISPOSABLE) ×12 IMPLANT
KIT BASIN OR (CUSTOM PROCEDURE TRAY) ×4 IMPLANT
KIT ROOM TURNOVER OR (KITS) ×4 IMPLANT
MANIFOLD NEPTUNE II (INSTRUMENTS) IMPLANT
NEEDLE 22X1 1/2 (OR ONLY) (NEEDLE) IMPLANT
NS IRRIG 1000ML POUR BTL (IV SOLUTION) ×8 IMPLANT
PACK ORTHO EXTREMITY (CUSTOM PROCEDURE TRAY) ×4 IMPLANT
PAD ARMBOARD 7.5X6 YLW CONV (MISCELLANEOUS) ×4 IMPLANT
PAD CAST 4YDX4 CTTN HI CHSV (CAST SUPPLIES) ×3 IMPLANT
PADDING CAST COTTON 4X4 STRL (CAST SUPPLIES) ×1
PADDING CAST COTTON 6X4 STRL (CAST SUPPLIES) ×4 IMPLANT
PENCIL BUTTON HOLSTER BLD 10FT (ELECTRODE) IMPLANT
PLATE ANTERIOR ANKLE FUSION 6H (Plate) ×4 IMPLANT
REAMER ROD DEEP FLUTE 2.5X950 (INSTRUMENTS) ×8 IMPLANT
SPONGE GAUZE 4X4 12PLY (GAUZE/BANDAGES/DRESSINGS) IMPLANT
SPONGE LAP 18X18 X RAY DECT (DISPOSABLE) ×8 IMPLANT
SPONGE SCRUB IODOPHOR (GAUZE/BANDAGES/DRESSINGS) IMPLANT
STAPLER VISISTAT 35W (STAPLE) IMPLANT
STOCKINETTE IMPERVIOUS LG (DRAPES) IMPLANT
STRIP CLOSURE SKIN 1/2X4 (GAUZE/BANDAGES/DRESSINGS) IMPLANT
SUCTION FRAZIER TIP 10 FR DISP (SUCTIONS) ×4 IMPLANT
SUT ETHILON 3 0 PS 1 (SUTURE) ×12 IMPLANT
SUT PDS AB 2-0 CT1 27 (SUTURE) ×8 IMPLANT
SUT VIC AB 0 CT1 27 (SUTURE)
SUT VIC AB 0 CT1 27XBRD ANBCTR (SUTURE) IMPLANT
SUT VIC AB 2-0 CT1 27 (SUTURE)
SUT VIC AB 2-0 CT1 TAPERPNT 27 (SUTURE) IMPLANT
SUT VIC AB 2-0 CT3 27 (SUTURE) IMPLANT
SUT VIC AB 2-0 SH 18 (SUTURE) IMPLANT
SUT VIC AB 3-0 FS2 27 (SUTURE) IMPLANT
SWAB COLLECTION DEVICE MRSA (MISCELLANEOUS) ×12 IMPLANT
SYR BULB 3OZ (MISCELLANEOUS) ×4 IMPLANT
SYR CONTROL 10ML LL (SYRINGE) IMPLANT
SYSTEM CHEST DRAIN TLS 7FR (DRAIN) IMPLANT
TOWEL OR 17X24 6PK STRL BLUE (TOWEL DISPOSABLE) ×8 IMPLANT
TOWEL OR 17X26 10 PK STRL BLUE (TOWEL DISPOSABLE) ×8 IMPLANT
TOWER CARTRIDGE SMART MIX (DISPOSABLE) ×4 IMPLANT
TUBE ANAEROBIC SPECIMEN COL (MISCELLANEOUS) ×12 IMPLANT
TUBE CONNECTING 12X1/4 (SUCTIONS) ×4 IMPLANT
UNDERPAD 30X30 INCONTINENT (UNDERPADS AND DIAPERS) IMPLANT
WATER STERILE IRR 1000ML POUR (IV SOLUTION) IMPLANT
YANKAUER SUCT BULB TIP NO VENT (SUCTIONS) IMPLANT

## 2012-09-25 NOTE — Anesthesia Preprocedure Evaluation (Addendum)
Anesthesia Evaluation  Patient identified by MRN, date of birth, ID band Patient awake    History of Anesthesia Complications Negative for: history of anesthetic complications  Airway Mallampati: I TM Distance: >3 FB Neck ROM: Full    Dental  (+) Teeth Intact and Dental Advisory Given   Pulmonary former smoker,  H/o PE: off coumadin, INR 0.96 breath sounds clear to auscultation  Pulmonary exam normal       Cardiovascular negative cardio ROS  Rhythm:Regular Rate:Normal     Neuro/Psych PSYCHIATRIC DISORDERS Anxiety negative neurological ROS     GI/Hepatic Neg liver ROS, GERD-  Medicated and Poorly Controlled,  Endo/Other  Morbid obesity  Renal/GU negative Renal ROS     Musculoskeletal   Abdominal (+) + obese,   Peds  Hematology negative hematology ROS (+)   Anesthesia Other Findings   Reproductive/Obstetrics                         Anesthesia Physical Anesthesia Plan  ASA: II  Anesthesia Plan: General   Post-op Pain Management:    Induction: Intravenous  Airway Management Planned: Oral ETT  Additional Equipment:   Intra-op Plan:   Post-operative Plan: Extubation in OR  Informed Consent: I have reviewed the patients History and Physical, chart, labs and discussed the procedure including the risks, benefits and alternatives for the proposed anesthesia with the patient or authorized representative who has indicated his/her understanding and acceptance.   Dental advisory given  Plan Discussed with: CRNA and Surgeon  Anesthesia Plan Comments: (Plan routine monitors, GETA)        Anesthesia Quick Evaluation

## 2012-09-25 NOTE — Transfer of Care (Signed)
Immediate Anesthesia Transfer of Care Note  Patient: Crystal Frank  Procedure(s) Performed: Procedure(s) (LRB) with comments: HARDWARE REMOVAL (Left) - HARDWARE REMOVAL LEFT ANKLE  TIBIAL OSTEOTOMY () - partial excision of tibia, placement of non-biodegradeable drug delivery device, left tibia  Patient Location: PACU  Anesthesia Type:General  Level of Consciousness: awake, alert  and oriented  Airway & Oxygen Therapy: Patient Spontanous Breathing and Patient connected to nasal cannula oxygen  Post-op Assessment: Report given to PACU RN, Post -op Vital signs reviewed and stable and Patient moving all extremities X 4  Post vital signs: Reviewed and stable  Complications: No apparent anesthesia complications

## 2012-09-25 NOTE — Brief Op Note (Signed)
09/25/2012  11:13 AM  PATIENT:  Crystal Frank  46 y.o. female  PRE-OPERATIVE DIAGNOSIS:  LEFT ANKLE NON UNION  POST-OPERATIVE DIAGNOSIS:  infected left ankle non-union  PROCEDURE:   1. Partial excision left tibia with reaming and open debridement 2. Removal of deep implant 3. Placement of antibiotic cement rod  SURGEON:  Surgeon(s) and Role:    * Budd Palmer, MD - Primary  PHYSICIAN ASSISTANT: Montez Morita, [facility]  ANESTHESIA:   general  EBL:  Total I/O In: 2000 [I.V.:2000] Out: 325 [Urine:325]  BLOOD ADMINISTERED:none  DRAINS: none   LOCAL MEDICATIONS USED:  NONE  SPECIMEN:  Source of Specimen:  nonunion site, peri-implant abscess at tibial locking bolt, reamings  DISPOSITION OF SPECIMEN:  micro  COUNTS:  YES  TOURNIQUET:   Total Tourniquet Time Documented: Thigh (Left) - 124 minutes  DICTATION: .Other Dictation: Dictation Number [id number]  PLAN OF CARE: Admit to inpatient   PATIENT DISPOSITION:  PACU - hemodynamically stable.   Delay start of Pharmacological VTE agent (>24hrs) due to surgical blood loss or risk of bleeding: no

## 2012-09-25 NOTE — Progress Notes (Signed)
Notified Dr. Ladene Artist of fluid intake

## 2012-09-25 NOTE — Progress Notes (Addendum)
ANTIBIOTIC CONSULT NOTE - INITIAL  Pharmacy Consult for Vancomycin and lovenox  Indication: ?osteomyelitis of tibia and history of clotting disorder/PE/lupus  No Known Allergies  Patient Measurements: Weight: 235 lb (106.595 kg) (Simultaneous filing. User may not have seen previous data.)  Vital Signs: Temp: 98.1 F (36.7 C) (11/05 1400) Temp src: Oral (11/05 0640) BP: 159/107 mmHg (11/05 1400) Pulse Rate: 90  (11/05 1400) Intake/Output from previous day:   Intake/Output from this shift: Total I/O In: 2500 [I.V.:2500] Out: 650 [Urine:625; Blood:25]  Labs: No results found for this basename: WBC:3,HGB:3,PLT:3,LABCREA:3,CREATININE:3 in the last 72 hours The CrCl is unknown because both a height and weight (above a minimum accepted value) are required for this calculation. No results found for this basename: VANCOTROUGH:2,VANCOPEAK:2,VANCORANDOM:2,GENTTROUGH:2,GENTPEAK:2,GENTRANDOM:2,TOBRATROUGH:2,TOBRAPEAK:2,TOBRARND:2,AMIKACINPEAK:2,AMIKACINTROU:2,AMIKACIN:2, in the last 72 hours   Microbiology: Recent Results (from the past 720 hour(s))  SURGICAL PCR SCREEN     Status: Abnormal   Collection Time   09/17/12 10:03 AM      Component Value Range Status Comment   MRSA, PCR NEGATIVE  NEGATIVE Final    Staphylococcus aureus POSITIVE (*) NEGATIVE Final   GRAM STAIN     Status: Normal   Collection Time   09/25/12  9:13 AM      Component Value Range Status Comment   Specimen Description TISSUE LEFT FOOT   Final    Special Requests TISSUE FROM NON UNION SITE LEFT ANKLE   Final    Gram Stain     Final    Value: MODERATE WBC PRESENT,BOTH PMN AND MONONUCLEAR     NO ORGANISMS SEEN     Gram Stain Report Called to,Read Back By and Verified With: Gerlene Fee RN 9:50 09/25/12 ([email or web address])   Report Status 09/25/2012 FINAL   Final   GRAM STAIN     Status: Normal   Collection Time   09/25/12  9:35 AM      Component Value Range Status Comment   Specimen Description FLUID LEFT ANKLE   Final      Special Requests FLUID ON SWAB NON UNION SITE   Final    Gram Stain     Final    Value: RARE WBC SEEN     NO ORGANISMS SEEN     Gram Stain Report Called to,Read Back By and Verified With: J SIPSIS RN AT 1045 09/25/12 BY LEONARD,A   Report Status 09/25/2012 FINAL   Final   GRAM STAIN     Status: Normal   Collection Time   09/25/12  9:39 AM      Component Value Range Status Comment   Specimen Description FLUID LEFT ANKLE   Final    Special Requests FLUID ON SWAB PERI PROSTHETIC FLUID   Final    Gram Stain     Final    Value: FEW WBC SEEN     NO ORGANISMS SEEN     Gram Stain Report Called to,Read Back By and Verified With: Raj Janus 1025 09/25/12 BY  LEONARD,A   Report Status 09/25/2012 FINAL   Final   GRAM STAIN     Status: Normal   Collection Time   09/25/12 10:10 AM      Component Value Range Status Comment   Specimen Description WOUND   Final    Special Requests EXPLANTED HARDWARE LEFT ANKLE NO4   Final    Gram Stain     Final    Value: RARE WBC PRESENT, PREDOMINANTLY MONONUCLEAR     NO ORGANISMS SEEN  HARDWARE SENT TO LAB IN NON STERILE CONTAINER   Report Status 09/25/2012 FINAL   Final   GRAM STAIN     Status: Normal   Collection Time   09/25/12 10:15 AM      Component Value Range Status Comment   Specimen Description TISSUE LEFT FOOT   Final    Special Requests REAMINGS LEFT TIBIA   Final    Gram Stain     Final    Value: RARE WBC PRESENT, PREDOMINANTLY MONONUCLEAR     NO ORGANISMS SEEN   Report Status 09/25/2012 FINAL   Final     Medical History: Past Medical History  Diagnosis Date  . Pulmonary embolism 2008    seen at [facility]--right lung  . Lupus   . Pleurisy    Assessment: Crystal Frank is a 46 year old female admitted for fixation of L tibiotalar nonunion to be started on vancomycin per pharmacy for ?osteomyelitis. Patient is afebrile. She is approximately 106kg (per patient) so she will need obese dosing. Noted pt is also on levaquin. Ancef giving as preop.   Pt  also with a history of clotting disorder on chronic coumadin managed at [facility]. Full dose lovenox per pharmacy ordered. No bleeding noted   Goal of Therapy:  Vancomycin trough level 15-20 mcg/ml  Plan:  Vancomycin loading dose of 2500mg  IV then  Vancomycin 1000mg  IV q12h starting 11/6 0400 Lovenox 100mg  SQ q12h Order BMET for tomorrow to address dosing/renal function CBC every 3 days while on lovenox  F/u renal function, trough of vancomycin at steady state  Thank you,  Brett Fairy, PharmD 09/25/2012 2:27 PM

## 2012-09-25 NOTE — Progress Notes (Signed)
Orthopedic Tech Progress Note Patient Details:  Crystal Frank 21-Jan-1966 [id number]  Patient ID: [name], female   DOB: Oct 04, 1966, 46 y.o.   MRN: [id number] Trapeze bar patient helper  Nikki Dom 09/25/2012, 2:16 PM

## 2012-09-25 NOTE — Progress Notes (Signed)
Post op check  Intraoperative findings discussed with the patient, who expressed support for the decisions made. Increased BP, o/w VSS; c/o pain but tolerable LLE intact sens distally and brisk CR; warm digits; no bleeding noted on drsg  Xrays appropriate with abx rod  Plan: F/u cultures Pain control, NWB LLE ID consulted, Dr. Daiva Eves Cont broad spectrum for now, anticipating PO with Cipro 750mg  Bid and Doxycycline 100mg  BID  Myrene Galas, MD [facility] [phone] (p)

## 2012-09-25 NOTE — Anesthesia Postprocedure Evaluation (Signed)
  Anesthesia Post-op Note  Patient: Crystal Frank  Procedure(s) Performed: Procedure(s) (LRB) with comments: HARDWARE REMOVAL (Left) - HARDWARE REMOVAL LEFT ANKLE  TIBIAL OSTEOTOMY () - partial excision of tibia, placement of non-biodegradeable drug delivery device, left tibia  Patient Location: PACU  Anesthesia Type:General  Level of Consciousness: awake, alert , oriented and patient cooperative  Airway and Oxygen Therapy: Patient Spontanous Breathing and Patient connected to nasal cannula oxygen  Post-op Pain: mild  Post-op Assessment: Post-op Vital signs reviewed, Patient's Cardiovascular Status Stable, Respiratory Function Stable, Patent Airway, No signs of Nausea or vomiting and Pain level controlled  Post-op Vital Signs: Reviewed and stable  Complications: No apparent anesthesia complications

## 2012-09-25 NOTE — Addendum Note (Signed)
Addendum  created 09/25/12 1252 by Germaine Pomfret, MD   Modules edited:Orders
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
## 2012-09-25 NOTE — H&P (Signed)
I have seen and examined the patient. I agree with the findings above. I discussed with the patient the risks and benefits of surgery, including the possibility of infection, nerve injury, vessel injury, wound breakdown, arthritis, persistent nonunion, symptomatic hardware, DVT/ PE, loss of motion, and need for further surgery among others.  She understood these risks and wished to proceed.  Budd Palmer, MD 09/25/2012 7:59 AM

## 2012-09-26 ENCOUNTER — Encounter (HOSPITAL_COMMUNITY): Payer: Self-pay | Admitting: Orthopedic Surgery

## 2012-09-26 DIAGNOSIS — IMO0002 Reserved for concepts with insufficient information to code with codable children: Secondary | ICD-10-CM

## 2012-09-26 DIAGNOSIS — M869 Osteomyelitis, unspecified: Secondary | ICD-10-CM

## 2012-09-26 DIAGNOSIS — D689 Coagulation defect, unspecified: Secondary | ICD-10-CM

## 2012-09-26 DIAGNOSIS — M329 Systemic lupus erythematosus, unspecified: Secondary | ICD-10-CM | POA: Insufficient documentation

## 2012-09-26 HISTORY — DX: Osteomyelitis, unspecified: M86.9

## 2012-09-26 HISTORY — DX: Reserved for concepts with insufficient information to code with codable children: IMO0002

## 2012-09-26 LAB — BASIC METABOLIC PANEL
BUN: 5 mg/dL — ABNORMAL LOW (ref 6–23)
CO2: 23 mEq/L (ref 19–32)
Calcium: 8.2 mg/dL — ABNORMAL LOW (ref 8.4–10.5)
Chloride: 101 mEq/L (ref 96–112)
Creatinine, Ser: 0.7 mg/dL (ref 0.50–1.10)
GFR calc Af Amer: >90 mL/min (ref >90)
GFR calc non Af Amer: >90 mL/min (ref >90)
Glucose, Bld: 144 mg/dL — ABNORMAL HIGH (ref 70–99)
Potassium: 4 mEq/L (ref 3.5–5.1)
Sodium: 135 mEq/L (ref 135–145)

## 2012-09-26 LAB — CBC
HCT: 36 % (ref 36.0–46.0)
Hemoglobin: 12.1 g/dL (ref 12.0–15.0)
MCH: 27.8 pg (ref 26.0–34.0)
MCHC: 33.6 g/dL (ref 30.0–36.0)
MCV: 82.8 fL (ref 78.0–100.0)
Platelets: 187 10*3/uL (ref 150–400)
RBC: 4.35 MIL/uL (ref 3.87–5.11)
RDW: 15.3 % (ref 11.5–15.5)
WBC: 9.8 10*3/uL (ref 4.0–10.5)

## 2012-09-26 LAB — PROTIME-INR
INR: 1.16 (ref 0.00–1.49)
Prothrombin Time: 14.6 seconds (ref 11.6–15.2)

## 2012-09-26 MED ORDER — HYDROXYZINE HCL 50 MG PO TABS
50.0000 mg | ORAL_TABLET | Freq: Three times a day (TID) | ORAL | Status: DC
  Administered 2012-09-26 – 2012-09-27 (×4): 50 mg via ORAL
  Filled 2012-09-26 (×6): qty 1

## 2012-09-26 MED ORDER — OXYCODONE-ACETAMINOPHEN 5-325 MG PO TABS
1.0000 | ORAL_TABLET | ORAL | Status: DC | PRN
  Administered 2012-09-26 – 2012-09-27 (×4): 2 via ORAL
  Filled 2012-09-26 (×4): qty 2

## 2012-09-26 NOTE — Evaluation (Signed)
Physical Therapy Evaluation Patient Details Name: Crystal Frank MRN: [id number] DOB: 1965-12-23 Today's Date: 09/26/2012 Time: 1030-1050 PT Time Calculation (min): 20 min  PT Assessment / Plan / Recommendation Clinical Impression  Pt is a 46 y/o female s/p removal of hardware from L talotibial fusion.  Pt is non weight bearing on L LE and has pain in R foot/ankle from past ankle fusion surgery when excessively weightbearing on R LE.  Pt also has c/o L shoulder pain which she has been diagnosed as having chronic bursitis.  Pain in L shoulder increases with weight bearing through the shoulders.   Attempting to ambulate with RW and NWB on L LE places pt at increased risk of injury to Right ankle and progressing pain in L shoulder.  Pt instructed in use of Rollator (knee walker) and demonstrated pain free independence with mobility.  Pt would greatly benefit from use of a Rollator for modifed independent functional mobility.  Acute Pt will continue to follow pt for stair training.   Suggesting HHPT follow-up for home safety eval.      PT Assessment  Patient needs continued PT services    Follow Up Recommendations  Home health PT    Barriers to Discharge None      Equipment Recommendations    Rollator (knee walker)   Recommendations for Other Services     Frequency Min 5X/week    Precautions / Restrictions Precautions Precautions: Fall Required Braces or Orthoses: Other Brace/Splint Other Brace/Splint: Cast splint on L foot and ankle.  Restrictions Weight Bearing Restrictions: Yes LLE Weight Bearing: Non weight bearing   Pertinent Vitals/Pain 5/10 pain in L foot and ankle.  2-3/10 pain in L shoulder with weight bearing, 4/10 pain in R foot/ankle with gait training.      Mobility  Bed Mobility Bed Mobility: Supine to Sit;Sit to Supine Supine to Sit: 7: Independent Sit to Supine: 7: Independent Transfers Transfers: Sit to Stand;Stand Pivot Transfers;Stand to Sit Sit to Stand:  5: Supervision;With upper extremity assist;From bed Stand to Sit: 5: Supervision;With upper extremity assist;To chair/3-in-1;With armrests Stand Pivot Transfers: 5: Supervision Details for Transfer Assistance: Supervision for safety secondary to multiple lines and leads.  Ambulation/Gait Ambulation/Gait Assistance: 4: Min guard Ambulation Distance (Feet): 15 Feet Assistive device: Rolling walker Ambulation/Gait Assistance Details: Pt has difficulty ambulating with RW while NWB on L LE secondary to pain in Right foot and ankle and pain in L shoulder.  Instructed pt in use of Rollator (knee walker) and pt able to navigate throughout the halls (>200) with no c/o pain in shoulder or R Foot/Ankle.   Gait Pattern: Step-to pattern Stairs: No    Shoulder Instructions     Exercises     PT Diagnosis: Difficulty walking;Acute pain  PT Problem List: Decreased activity tolerance;Decreased mobility;Pain PT Treatment Interventions: Gait training;Stair training;Functional mobility training;Therapeutic activities;Patient/family education;DME instruction   PT Goals Acute Rehab PT Goals PT Goal Formulation: With patient Time For Goal Achievement: 10/04/12 Potential to Achieve Goals: Good Pt will Go Up / Down Stairs: 3-5 stairs;with modified independence;with least restrictive assistive device PT Goal: Up/Down Stairs - Progress: Goal set today  Visit Information  Last PT Received On: 09/26/12    Subjective Data  Subjective: Agree to PT eval   Prior Functioning  Home Living Lives With: Significant other Available Help at Discharge: Family;Available 24 hours/day Type of Home: House Home Access: Stairs to enter [employer] of Steps: 5 Entrance Stairs-Rails: Right;Left;Can reach both Home Layout: One level Bathroom  Shower/Tub: Counselling psychologist: Yes How Accessible: Accessible via walker Home Adaptive Equipment: Bedside  commode/3-in-1;Walker - rolling;Shower chair with back Prior Function Level of Independence: Independent Able to Take Stairs?: Yes Driving: Yes Vocation: Unemployed Communication Communication: No difficulties Dominant Hand: Left    Cognition  Overall Cognitive Status: Appears within functional limits for tasks assessed/performed Arousal/Alertness: Awake/alert Orientation Level: Appears intact for tasks assessed Behavior During Session: [facility] for tasks performed    Extremity/Trunk Assessment Right Upper Extremity Assessment RUE ROM/Strength/Tone: Within functional levels Left Upper Extremity Assessment LUE ROM/Strength/Tone: Deficits;Due to pain LUE ROM/Strength/Tone Deficits: Strength and ROM limited secondary to pain in shoulder pt reports previous diagnosis of bursitis in shoulder.   Right Lower Extremity Assessment RLE ROM/Strength/Tone: Deficits;Due to pain RLE ROM/Strength/Tone Deficits: past injury ankle fusion, limited standing and walking secondary to pain.  Left Lower Extremity Assessment LLE ROM/Strength/Tone: Unable to fully assess;Due to pain;Due to precautions;Deficits Trunk Assessment Trunk Assessment: Normal   Balance Balance Balance Assessed: No  End of Session PT - End of Session Equipment Utilized During Treatment: Gait belt Activity Tolerance: Patient tolerated treatment well Patient left: in chair;with call bell/phone within reach Nurse Communication: Mobility status  GP Functional Assessment Tool Used: clinical Judgement Functional Limitation: Mobility: Walking and moving around Mobility: Walking and Moving Around Current Status (Z3086): At least 20 percent but less than 40 percent impaired, limited or restricted Mobility: Walking and Moving Around Goal Status [phone]): At least 1 percent but less than 20 percent impaired, limited or restricted   Mry Lamia 09/26/2012, 11:24 AM  Shevonne Wolf L. Alfie Rideaux DPT [phone]

## 2012-09-26 NOTE — Progress Notes (Signed)
CARE MANAGEMENT NOTE 09/26/2012  Patient:  Crystal Frank, Crystal Frank   Account Number:  [id number]  Date Initiated:  09/25/2012  Documentation initiated by:  STANFILL,BERTHA  Subjective/Objective Assessment:   Partial excision left tibia with reaming and open debridement  2. Removal of deep implant  3. Placement of antibiotic cement rod  ? need IV Abx     Action/Plan:   CM to arrange for [facility] and DME with worker's comp.   Anticipated DC Date:  09/27/2012   Anticipated DC Plan:        DC Planning Services  CM consult      [facility] Choice  HOME HEALTH  DURABLE MEDICAL EQUIPMENT   Choice offered to / List presented to:          [facility] arranged  HH-2 PT      Status of service:  In process, will continue to follow Medicare Important Message given?   (If response is "NO", the following Medicare IM given date fields will be blank) Date Medicare IM given:   Date Additional Medicare IM given:    Discharge Disposition:    Per UR Regulation:    If discussed at Long Length of Stay Meetings, dates discussed:    Comments:  09/26/12 13:59 Vance Peper, RN BSN Case Manager Called worker's comp and left message for Phil. Need authorization for knee walker and home health PT.     09/25/12  16:05  Anette Guarneri RN/CM Works Comp/[place], ph# [phone] UJ#[id number] DOI 10/26/2009

## 2012-09-26 NOTE — Op Note (Signed)
NAMEARELYS, ARABIE               ACCOUNT NO.:  [id number]  MEDICAL RECORD NO.:  [id number]  LOCATION:  [id number]                        FACILITY:  [facility]  PHYSICIAN:  Doralee Albino. Carola Frost, M.D. DATE OF BIRTH:  [birth year]  DATE OF PROCEDURE:  09/25/2012 DATE OF DISCHARGE:                              OPERATIVE REPORT   PREOPERATIVE DIAGNOSIS:  Left ankle arthrodesis, nonunion.  POSTOPERATIVE DIAGNOSIS:  Infected left ankle arthrodesis, nonunion.  PROCEDURES: 1. Partial excision of left tibia with open debridement in addition to     reaming. 2. Removal of deep implant, left tibia, talus and calcaneus. 3. Placement of antibiotic cement rod with gentamicin and vancomycin.  SURGEON:  Doralee Albino. Carola Frost, M.D.  ASSISTANT:  Mearl Latin, PA-C  ANESTHESIA:  General.  COMPLICATIONS:  None.  ESTIMATED BLOOD LOSS:  Approximately 60 mL.  I'S/O'S:  2000 mL crystalloid/325 mL UOP.  DRAINS:  None.  SPECIMENS:  Eight.  Two sent from the nonunion site, anaerobic, aerobic; four from the proximal locking bolt abscess, which had apparent purulence and fluid collection around it, anaerobic and aerobic analysis as well as AFB and fungal.  Additionally, two specimens were sent from the intramedullary reamings.  Disposition of specimens all to Micro.  TOTAL TOURNIQUET TIME:  124 minutes.  DISPOSITION:  To PACU.  CONDITION:  Stable.  BRIEF SUMMARY AND INDICATION FOR PROCEDURE:  Crystal Frank is a 46 year old female who has had long history of difficulty with her feet and ankles following traumatic injuries.  She is status post contralateral fusion and attempted fusion on this side.  The patient underwent dynamization, but never united.  The patient did continue to smoke during her recovery, increasing the chances of nonunion.  We did discuss with her the risk and benefits of surgical treatment including the possibility of infection, nerve injury, vessel injury, need further surgery, persistent  nonunion, pain, and many others, and she did wish to proceed.  BRIEF DESCRIPTION OF PROCEDURE:  [name] was taken to the operating room where general anesthesia was induced.  Her ankle was then approached through an anterior incision.  Dissection was carried down to the superficial peroneal nerve branches that were coming across from the lateral side.  I did attempt to retain the branch to the medial aspect of the foot, but over time I became increasingly concerned about excessive stretch on the nerve and consequently went ahead with controlled transection to prevent causalgia or ST-type symptoms from excessive and repeated stretches, it was completed within the working area and traction, and repeated traction could not be avoided.  I then continued deep where the retinaculum was incised.  This allowed me to mobilize the anterior tib tendon both medially and laterally.  The neurovascular bundle was identified and carefully retracted laterally at the level of the joint.  I did go subperiosteal and exposed the entirety of the joint.  I did not encounter any purulence at that level and using a 15-blade and curettes in stepwise fashion, was able to develop the nonunion between the articular surface of the talus and tibia.  It appeared to be 2 mm in most areas.  There was fibrous bridging tissue throughout and no  frank gap.  I did not encounter any significant fluid, but the bone did not appear exceedingly well vascularized there either. As we continued, it became clear that in order to better oppose and developed the nonunion site that it would be advisable to remove the Fusion nail as well.  Proximally, I then developed the medial exposure along the tibial shaft and at that point, we encountered a significant collection of fluid and then this was followed by more fibrinous and purulent-type collection directly over the most proximal dynamic locking bolt.  The screw was completely loose and  was removed with a clamp. Again, four cultures were sent from this area; two had been sent from the distal tibia.  The patient had received preoperative antibiotics in accordance with standard practice.  Because of the clinical appearance and the patient's need to undergo extensive grafting to produce a stable arthrodesis, I did not feel it advisable to continue with attempted arthrodesis.  Rather we continued with the debridement and this consisted of curettage of the locking bolt site, additional incisions about the calcaneal tuberosity to remove the locking bolts there, additional incision around the talus to remove the locking bolt there and then removal of those.  The calcaneal screw most proximally had been overgrown.  The talus screw was loosed and then the nail was withdrawn through the plantar incision.  A guidewire was placed into this to perform an excision of the infected fusion nail tract.  The ball-tip guidewire was engaged into the sclerotic portion of the shaft proximally and this was followed by sequential reaming from 10-13, the last reamer that had been used was an 11 at the end of the previous procedure and we did degenerate quite a bit of reamings and remove the entire pseudocapsule around the nail.  Following this, we then fashioned an antibiotic nail over a guidewire using Palacos cement with gentamicin and vancomycin.  I also consulted the Infectious Disease Service intraoperatively who suggested postoperative course of Cipro and doxycycline at 500-750 mg b.i.d. as well as 100 mg b.i.d. respectively. We did irrigate after reaming, going retrograde while opened the plantar incision and scored an irrigation through the locking bolt site proximally and we were able to flush the canal quite clean in this manner.  This was followed by a standard layered closure using 2-0 PDS and 3-0 nylon.  Sterile gently compressive dressing was applied and then a splint.  The patient  was taken to the PACU in stable condition.  Next, Montez Morita, PA-C assisted me throughout the procedure and was absolutely necessary for its safe and effective completion as he protected the anterior neurovascular bundle, exposed the nonunion site at the tibiotalar arthrodesis site and also assisted with some debridement there.  He also helped to fashion the antibiotic nail and assisted me with closure as well as retraction during removal of the locking bolts and Fusion nail implants.  PROGNOSIS:  Ms. Glazebrook will be on antibiotics for the next several weeks. Now, she has had complete removal of her implants.  We anticipate a return to the OR in approximately 3 weeks for fusion.  We will discuss the possibility of iliac crest autografting to augment our fusion construct and I anticipate an anterior plate as well as grafting through the intramedullary site.     Doralee Albino. Carola Frost, M.D.     [facility]MED[facility]  D:  09/25/2012  T:  09/26/2012  Job:  [id number]

## 2012-09-26 NOTE — Progress Notes (Signed)
[facility] ([facility])  Subjective: 1 Day Post-Op Procedure(s) (LRB): HARDWARE REMOVAL (Left) TIBIAL OSTEOTOMY ()   Reviewed nsg notes from yesterday Pt states pain 8/10 today but appears very comfortable Denies CP, No SOB   Objective: Current Vitals Blood pressure 132/82, pulse 88, temperature 98.5 F (36.9 C), temperature source Oral, resp. rate 18, height 5\' 11"  (1.803 m), weight 106.595 kg (235 lb), last menstrual period 09/05/2012, SpO2 98.00%. Vital signs in last 24 hours: Temp:  [98 F (36.7 C)-98.5 F (36.9 C)] 98.5 F (36.9 C) (11/06 0650) Pulse Rate:  [81-103] 88  (11/06 0650) Resp:  [9-18] 18  (11/06 0650) BP: (124-167)/(73-107) 132/82 mmHg (11/06 0650) SpO2:  [96 %-100 %] 98 % (11/06 0650) FiO2 (%):  [2 %] 2 % (11/05 1947) Weight:  [106.595 kg (235 lb)] 106.595 kg (235 lb) (11/05 1400)  Intake/Output from previous day: 11/05 0701 - 11/06 0700 In: 4550 [P.O.:1000; I.V.:3100; IV Piggyback:450] Out: 4000 [Urine:3975; Blood:25] Intake/Output      11/05 0701 - 11/06 0700 11/06 0701 - 11/07 0700   P.O. 1000    I.V. (mL/kg) 3100 (29.1)    IV Piggyback 450    Total Intake(mL/kg) 4550 (42.7)    Urine (mL/kg/hr) 3975 (1.6)    Blood 25    Total Output 4000    Net +550            LABS  Basename 09/26/12 0645  HGB 12.1    Basename 09/26/12 0645  WBC 9.8  RBC 4.35  HCT 36.0  PLT 187    Basename 09/26/12 0645  NA 135  K 4.0  CL 101  CO2 23  BUN 5*  CREATININE 0.70  GLUCOSE 144*  CALCIUM 8.2*    Basename 09/26/12 0645 09/25/12 0615  LABPT -- --  INR 1.16 0.96     Multiple intra-op cx: NGTD   Physical Exam  ZOX:WRUEA and alert, appears comfortable Lungs:clear anteriorly Cardiac: s1 and s2 Abd:+ BS NT Ext:   Left Lower Extremity   Splint C/D/I   Distal motor and sensory functions at baseline   Ext warm     Assessment/Plan: 1 Day Post-Op Procedure(s) (LRB): HARDWARE REMOVAL (Left) TIBIAL OSTEOTOMY ()  46 y/o female  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 L tibiotalar fusion nonunion  S/p removal of hardware  Placement of abx nail  Delay definitive revision 2-3 weeks  Continue with splint  Ice and elevate  NWB Left leg  PT to mobilize  2. Lupus/clotting d/o  Pt on lovenox 100 mg bid   Plan to leave on lovenox until after next surgery  3. Pain  Chronic pain issues  D/c iv tylenol  Add percocet  Continue with oxy ir and iv dilaudid  Will add vistaril  Pt not tachy or exhibiting other objective signs of pain 4. DVT/PE prophy  Per 2 5. ID  Continue with IV vanc and PO levaquin  cx with NGTD  D/c home with doxy 100 mg po bid, levaquin 750 mg po daily 6. FEN  D/c foley  nsl IVF  Diet as tolerated 7. Dispo  D/c home tomorrow  Mearl Latin, PA-C [facility] [phone] (P) 09/26/2012, 8:47 AM

## 2012-09-26 NOTE — Progress Notes (Signed)
Pt was very upset about her pain management when she arrived on unit from PACU. Insisted that she was not receiving adequate pain medication and that her pain score was 10. Josh Chadwell, PA on call notified of pt complaint and all the analgesics she had received. Changed dilaudid I mg IV to q 2 hr. Pt was satisfied with this change. She received all her medications throughout the night with good results. Her attitude was much more pleasant, she was able to sleep for 2 hour periods of time, and was hungry and able to eat during the night. Stated her pain was still intense but her score had dropped to an 8.

## 2012-09-27 ENCOUNTER — Telehealth: Payer: Self-pay | Admitting: Pharmacist

## 2012-09-27 DIAGNOSIS — G8929 Other chronic pain: Secondary | ICD-10-CM

## 2012-09-27 LAB — WOUND CULTURE: Culture: NO GROWTH

## 2012-09-27 MED ORDER — METHOCARBAMOL 100 MG/ML IJ SOLN
500.0000 mg | Freq: Four times a day (QID) | INTRAVENOUS | Status: DC
  Filled 2012-09-27 (×4): qty 5

## 2012-09-27 MED ORDER — OXYCODONE HCL 10 MG PO TABS: 10.0000 mg | ORAL_TABLET | Freq: Every day | ORAL | Status: DC

## 2012-09-27 MED ORDER — ENOXAPARIN SODIUM 100 MG/ML ~~LOC~~ SOLN: 100.0000 mg | Freq: Two times a day (BID) | SUBCUTANEOUS | Status: DC

## 2012-09-27 MED ORDER — ACETAMINOPHEN 500 MG PO TABS: 500.0000 mg | ORAL_TABLET | Freq: Four times a day (QID) | ORAL | Status: DC | PRN

## 2012-09-27 MED ORDER — METHOCARBAMOL 500 MG PO TABS
1000.0000 mg | ORAL_TABLET | Freq: Four times a day (QID) | ORAL | Status: DC
  Administered 2012-09-27 (×2): 1000 mg via ORAL
  Filled 2012-09-27 (×2): qty 2

## 2012-09-27 MED ORDER — DOXYCYCLINE HYCLATE 50 MG PO CAPS: 100.0000 mg | ORAL_CAPSULE | Freq: Two times a day (BID) | ORAL | Status: DC

## 2012-09-27 MED ORDER — DSS 100 MG PO CAPS: 100.0000 mg | ORAL_CAPSULE | Freq: Two times a day (BID) | ORAL | Status: DC | PRN

## 2012-09-27 MED ORDER — LEVOFLOXACIN 750 MG PO TABS: 750.0000 mg | ORAL_TABLET | Freq: Every day | ORAL | Status: DC

## 2012-09-27 NOTE — Progress Notes (Signed)
Utilization review completed. Wednesday Ericsson, RN, BSN. 

## 2012-09-27 NOTE — Telephone Encounter (Signed)
Spoke with [facility] office.  Explained that we only have 10 syringes of Lovenox left from our original order for pt from the company.  I do not know how many syringes the patient has at home from the 20 we gave her prior to surgery.  Suggested the PA contact case management prior to patient leaving the hospital to see if the patient assistance program will be able to supply her additional Lovenox syringes from the hospital.  If we do place her back on Coumadin between the procedures, unsure how long it will take [place] to get pt to therapeutic INR as her anticoagulation regimen has been difficult to manage.

## 2012-09-27 NOTE — Progress Notes (Addendum)
CARE MANAGEMENT NOTE 09/27/2012  Patient:  Crystal Frank, Crystal Frank   Account Number:  [id number]  Date Initiated:  09/25/2012  Documentation initiated by:  STANFILL,BERTHA  Subjective/Objective Assessment:   Partial excision left tibia with reaming and open debridement  2. Removal of deep implant  3. Placement of antibiotic cement rod  ? need IV Abx     Action/Plan:   CM to arrange for [facility] and DME with worker's comp.   Anticipated DC Date:  09/27/2012   Anticipated DC Plan:  HOME W HOME HEALTH SERVICES      DC Planning Services  CM consult  Medication Assistance      [facility]  HOME HEALTH  DURABLE MEDICAL EQUIPMENT   Choice offered to / List presented to:          HH arranged  HH-2 PT      HH agency  OTHER - SEE NOTE   Status of service:  Completed, signed off Medicare Important Message given?   (If response is "NO", the following Medicare IM given date fields will be blank) Date Medicare IM given:   Date Additional Medicare IM given:    Discharge Disposition:    Per UR Regulation:    If discussed at Long Length of Stay Meetings, dates discussed:    Comments:  09/27/12 14:30 Vance Peper, RN BSN Recieved call from [facility]. Instructed to call [phone]. Spoke with Scarlette Calico and faxed orders to her at [phone]. Patient states she will come back for her lovenox, and wants walker delivered to her home. [facility] will contact patient regarding which agency will provide therapy.    09/27/12 11:00 Vance Peper, RN BSN patient will need Lovenox 100 mg BID for 21 days. This is a pre existing condition and not related to her worker's comp. patient is eligible for assistance from [employer]. [employer] form completed and sent to main pharmacy with prescription.   09/26/12 14:00 Vance Peper, RN BSN Adjuster is Michel Harrow, [phone] ext. [phone].  09/26/12 13:59 Vance Peper, RN BSN Case Manager Called worker's comp and left message for Michele Mcalpine.  Need authorization for knee walker and home health PT.     09/25/12  16:05  Anette Guarneri RN/CM Works Comp/[place], ph# [phone] GU#[phone] DOI 10/26/2009

## 2012-09-27 NOTE — Progress Notes (Signed)
Pt continues to state her pain is an 8/10. Becomes upset if she does not receive her analgesics exactly on time. Insists that she will need pain medication as recommended by her pain management MD when she is discharged. She is moving well and is voiding at [facility].
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
## 2012-09-27 NOTE — Progress Notes (Addendum)
Physical Therapy Treatment Patient Details Name: Crystal Frank MRN: [id number] DOB: Jun 12, 1966 Today's Date: 09/27/2012 Time:  -    09/27/12 1400  PT Visit Information  Last PT Received On 09/27/12  PT Time Calculation  PT Start Time 1405  PT Stop Time 1429  PT Time Calculation (min) 24 min  Precautions  Precautions Fall  Required Braces or Orthoses Other Brace/Splint  Restrictions  Weight Bearing Restrictions Yes  LLE Weight Bearing NWB  Cognition  Overall Cognitive Status Appears within functional limits for tasks assessed/performed  Arousal/Alertness Awake/alert  Orientation Level Appears intact for tasks assessed  Behavior During Session [facility] for tasks performed  Bed Mobility  Bed Mobility Supine to Sit;Sit to Supine  Supine to Sit 7: Independent  Sit to Supine 7: Independent  Transfers  Transfers Sit to Stand;Stand Pivot Transfers;Stand to Sit  Sit to Stand 6: Modified independent (Device/Increase time)  Stand to Sit 6: Modified independent (Device/Increase time)  Stand Pivot Transfers 6: Modified independent (Device/Increase time)  Ambulation/Gait  Ambulation/Gait Assistance 6: Modified independent (Device/Increase time)  Ambulation Distance (Feet) 10 Feet  Assistive device Rolling walker  Gait Pattern Step-to pattern  Stairs Yes  Stairs Assistance 4: Min assist;5: Supervision  Stairs Assistance Details (indicate cue type and reason) Pt has difficulty hopping up steps.  Pt states that she prefers to sit on and scoot up the stairs.    Stair Management Technique Two rails;Forwards;Backwards;With walker  Number of Stairs 5   Wheelchair Mobility  Wheelchair Mobility No  PT - End of Session  Equipment Utilized During Treatment Gait belt  Activity Tolerance Patient tolerated treatment well  Patient left in chair;with call bell/phone within reach  Nurse Communication Mobility status  PT - Assessment/Plan  Comments on Treatment Session Pt navigated the unit with Knee  walker (Rollator) independently.    PT Plan Discharge plan remains appropriate;All goals met and education completed, patient dischaged from PT services  Follow Up Recommendations Home health PT  Equipment Recommended Other (comment) (Rollator - Knee Walker)  Acute Rehab PT Goals  PT Goal Formulation With patient  Time For Goal Achievement 10/04/12  Potential to Achieve Goals Good  Pt will Go Up / Down Stairs 3-5 stairs;with least restrictive assistive device;with supervision  PT Goal: Up/Down Stairs - Progress Met  PT G-Codes **NOT FOR INPATIENT CLASS**  Functional Assessment Tool Used clinical Judgement  Functional Limitation Mobility: Walking and moving around  Mobility: Walking and Moving Around Current Status (W0981) CI  Mobility: Walking and Moving Around Goal Status (X9147) CI  Mobility: Walking and Moving Around Discharge Status (W2956) CI  PT General Charges  $$ ACUTE PT VISIT 1 Procedure  PT Treatments  $Therapeutic Activity 23-37 mins   Laiyah Exline L. Rodger Giangregorio DPT [phone]

## 2012-09-27 NOTE — Progress Notes (Signed)
[facility] (OTS)  Subjective: 2 Days Post-Op Procedure(s) (LRB): HARDWARE REMOVAL (Left) TIBIAL OSTEOTOMY ()  Nursing note reviewed from yesterday Spoke with Dr. Ollen Bowl yesterday re: pain control Agreeable to increase oxy IR 10mg  to 5/day, thus I will write for Oxy IR 10mg  1 po daily. This will supplement her schedule. Pt also mentioned that she finished her October allowance of oxy IR early and that her Nov script cannot be filled until tomorrow  Pt continues to appear very comfortable and is mobilizing well  Objective: Current Vitals Blood pressure 130/77, pulse 96, temperature 98.7 F (37.1 C), temperature source Oral, resp. rate 18, height 5\' 11"  (1.803 m), weight 106.595 kg (235 lb), last menstrual period 09/05/2012, SpO2 96.00%. Vital signs in last 24 hours: Temp:  [98.7 F (37.1 C)-99.5 F (37.5 C)] 98.7 F (37.1 C) (11/07 0603) Pulse Rate:  [96-99] 96  (11/07 0603) Resp:  [18] 18  (11/07 0603) BP: (115-133)/(52-99) 130/77 mmHg (11/07 0603) SpO2:  [96 %-100 %] 96 % (11/07 0603)  Intake/Output from previous day: 11/06 0701 - 11/07 0700 In: 1390 [P.O.:660; I.V.:680; IV Piggyback:50] Out: 1500 [Urine:1500] Intake/Output      11/06 0701 - 11/07 0700 11/07 0701 - 11/08 0700   P.O. 660    I.V. (mL/kg) 680 (6.4)    IV Piggyback 50    Total Intake(mL/kg) 1390 (13)    Urine (mL/kg/hr) 1500 (0.6)    Blood     Total Output 1500    Net -110         Urine Occurrence 1 x      LABS  Basename 09/26/12 0645  HGB 12.1    Basename 09/26/12 0645  WBC 9.8  RBC 4.35  HCT 36.0  PLT 187    Basename 09/26/12 0645  NA 135  K 4.0  CL 101  CO2 23  BUN 5*  CREATININE 0.70  GLUCOSE 144*  CALCIUM 8.2*    Basename 09/26/12 0645 09/25/12 0615  LABPT -- --  INR 1.16 0.96   Cultures: NGTD   Physical Exam  Gen: Patient appears very comfortable, in no acute distress Lungs: Clear Cardiac: Regular rate and rhythm Abd: Positive bowel  nontender Ext:  Left Lower Extremity    Splint C/D/I    Distal motor and sensory functions at baseline    Ext warm   Assessment/Plan: 2 Days Post-Op Procedure(s) (LRB): HARDWARE REMOVAL (Left) TIBIAL OSTEOTOMY ()  46 y/o female with 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 L tibiotalar fusion nonunion   S/p removal of hardware   Placement of abx nail   Delay definitive revision 2-3 weeks   Continue with splint   Ice and elevate   NWB Left leg   PT to mobilize   D/c home today 2. Lupus/clotting d/o   Pt on lovenox 100 mg bid   Plan to leave on lovenox until after next surgery  3. Pain   Chronic pain issues   Discussed with Dr. Ollen Bowl  Plan in place  Pt will continue with her pre-hospital regimen, will add 1 additional oxy 10 mg per day for breakthrough  Pt can add OTC tylenol as well, LFT's normal on pre-op lab work   Pt not tachy or exhibiting other objective signs of pain  4. DVT/PE prophy   Per 2  5. ID   D/c home with doxycycline 100mg  po q 12 h and Levaquin 750 mg po daily  cx with NGTD  6. FEN   D/c home today 7. Dispo   D/c home tomorrow   Mearl Latin, PA-C [facility] [phone] (P) 09/27/2012, 8:33 AM

## 2012-09-27 NOTE — Telephone Encounter (Signed)
New Problem:    Called in wanting to speak with someone about the patient coumadin.  Patient is scheduled to go back into OR in 3-4 weeks and wanted to know if the patient should restart coumadin or if they should keep her on Lovanox.  Please call back.

## 2012-09-28 LAB — TISSUE CULTURE
Culture: NO GROWTH
Culture: NO GROWTH

## 2012-09-28 LAB — BODY FLUID CULTURE
Culture: NO GROWTH
Culture: NO GROWTH

## 2012-09-28 LAB — NICOTINE SCREEN, URINE
Cotinine, Urine: 197 ng/mL
Nicotine, urine: 8 ng/mL

## 2012-09-28 NOTE — Telephone Encounter (Signed)
Patient called and she is home from her ankle surgery, she was sent home with lovenox injections to cover 4 weeks, she has 5 injections left over from prior bridging,  reviewing discharge notes patient is to continue lovenox injections as directed until further surgery has been scheduled, around 2-3 weeks.

## 2012-09-30 LAB — ANAEROBIC CULTURE

## 2012-10-06 NOTE — Progress Notes (Signed)
I saw and examined the patient with Crystal Frank, communicating the findings and plan noted above.  [name], MD [facility] [phone] (p)  

## 2012-10-10 ENCOUNTER — Telehealth: Payer: Self-pay | Admitting: *Deleted

## 2012-10-10 NOTE — Telephone Encounter (Signed)
Received call from patient, she continues her lovenox as directed, her next surgery is scheduled for 10/23/2012, her workers comp terminates on 10/26/2012, she will be admitted for her surgery and probably d/c 10/25/2012, coumadin clinic provided her with her final box of lovenox injections that will last her up until her surgery, with her prior surgery the hospital provided her with her post op lovenox injections.

## 2012-10-22 ENCOUNTER — Encounter (HOSPITAL_COMMUNITY): Payer: Self-pay

## 2012-10-22 ENCOUNTER — Encounter (HOSPITAL_COMMUNITY)
Admission: RE | Admit: 2012-10-22 | Discharge: 2012-10-22 | Disposition: A | Discharge status: Home / Self Care | Payer: Worker's Compensation | Source: Ambulatory Visit | Attending: Orthopedic Surgery | Admitting: Orthopedic Surgery

## 2012-10-22 LAB — BASIC METABOLIC PANEL
BUN: 13 mg/dL (ref 6–23)
CO2: 22 mEq/L (ref 19–32)
Calcium: 9.8 mg/dL (ref 8.4–10.5)
Chloride: 105 mEq/L (ref 96–112)
Creatinine, Ser: 0.81 mg/dL (ref 0.50–1.10)
GFR calc Af Amer: >90 mL/min (ref >90)
GFR calc non Af Amer: 87 mL/min — ABNORMAL LOW (ref >90)
Glucose, Bld: 99 mg/dL (ref 70–99)
Potassium: 4 mEq/L (ref 3.5–5.1)
Sodium: 140 mEq/L (ref 135–145)

## 2012-10-22 LAB — CBC
HCT: 43.4 % (ref 36.0–46.0)
Hemoglobin: 15 g/dL (ref 12.0–15.0)
MCH: 28.5 pg (ref 26.0–34.0)
MCHC: 34.6 g/dL (ref 30.0–36.0)
MCV: 82.4 fL (ref 78.0–100.0)
Platelets: 221 10*3/uL (ref 150–400)
RBC: 5.27 MIL/uL — ABNORMAL HIGH (ref 3.87–5.11)
RDW: 16.2 % — ABNORMAL HIGH (ref 11.5–15.5)
WBC: 11 10*3/uL — ABNORMAL HIGH (ref 4.0–10.5)

## 2012-10-22 LAB — SURGICAL PCR SCREEN
MRSA, PCR: NEGATIVE
Staphylococcus aureus: NEGATIVE

## 2012-10-22 LAB — HCG, SERUM, QUALITATIVE: Preg, Serum: NEGATIVE

## 2012-10-22 LAB — PROTIME-INR
INR: 1.05 (ref 0.00–1.49)
Prothrombin Time: 13.6 seconds (ref 11.6–15.2)

## 2012-10-22 LAB — APTT: aPTT: 40 seconds — ABNORMAL HIGH (ref 24–37)

## 2012-10-22 NOTE — Progress Notes (Addendum)
Pt has not arrived for pat visit ... Unable to call pt number given [phone] has not been set up ... And unable to get through .. Unable to leave message.   Dr Magdalene Patricia office advised  Of no orders and of pt not arriving and of non useable phone number... [phone] is the number the office has also.

## 2012-10-22 NOTE — Pre-Procedure Instructions (Signed)
20 AHMANI DAOUD  10/22/2012   Your procedure is scheduled on:  October 23, 2012  Tuesday  Report to [facility] at 0900 AM.  Call this number if you have problems the morning of surgery: [phone]   Remember:   Do not eat food donot drink liquids. :After Midnight.   Take these medicines the morning of surgery with A SIP OF WATER: doxycycline levaquin  oxycodone lyrica   Do not wear jewelry, make-up or nail polish.  Do not wear lotions, powders, or perfumes. You may wear deodorant.  Do not shave 48 hours prior to surgery. Men may shave face and neck.  Do not bring valuables to the hospital.  Contacts, dentures or bridgework may not be worn into surgery.  Leave suitcase in the car. After surgery it may be brought to your room.  For patients admitted to the hospital, checkout time is 11:00 AM the day of discharge.   Patients discharged the day of surgery will not be allowed to drive home.  Name and phone number of your driver: Blythe Stanford- friend- [phone]  Special Instructions: Shower using CHG 2 nights before surgery and the night before surgery.  If you shower the day of surgery use CHG.  Use special wash - you have one bottle of CHG for all showers.  You should use approximately 1/3 of the bottle for each shower.   Please read over the following fact sheets that you were given: Pain Booklet, Coughing and Deep Breathing, Lab Information, MRSA Information and Surgical Site Infection Prevention

## 2012-10-23 ENCOUNTER — Ambulatory Visit (HOSPITAL_COMMUNITY): Payer: Worker's Compensation

## 2012-10-23 ENCOUNTER — Encounter (HOSPITAL_COMMUNITY)
Admission: RE | Disposition: A | Discharge status: Home / Self Care | Payer: Self-pay | Source: Ambulatory Visit | Attending: Orthopedic Surgery

## 2012-10-23 ENCOUNTER — Ambulatory Visit (HOSPITAL_COMMUNITY): Payer: Worker's Compensation | Admitting: Anesthesiology

## 2012-10-23 ENCOUNTER — Inpatient Hospital Stay (HOSPITAL_COMMUNITY)
Admission: RE | Admit: 2012-10-23 | Discharge: 2012-10-25 | DRG: 493 | Disposition: A | Discharge status: Home / Self Care | Payer: Worker's Compensation | Source: Ambulatory Visit | Attending: Orthopedic Surgery | Admitting: Orthopedic Surgery

## 2012-10-23 ENCOUNTER — Encounter (HOSPITAL_COMMUNITY): Payer: Self-pay | Admitting: *Deleted

## 2012-10-23 ENCOUNTER — Encounter (HOSPITAL_COMMUNITY): Payer: Self-pay | Admitting: Anesthesiology

## 2012-10-23 DIAGNOSIS — Z981 Arthrodesis status: Secondary | ICD-10-CM

## 2012-10-23 DIAGNOSIS — IMO0002 Reserved for concepts with insufficient information to code with codable children: Secondary | ICD-10-CM | POA: Diagnosis present

## 2012-10-23 DIAGNOSIS — Z01812 Encounter for preprocedural laboratory examination: Secondary | ICD-10-CM

## 2012-10-23 DIAGNOSIS — Z86711 Personal history of pulmonary embolism: Secondary | ICD-10-CM

## 2012-10-23 DIAGNOSIS — G8929 Other chronic pain: Secondary | ICD-10-CM | POA: Diagnosis present

## 2012-10-23 DIAGNOSIS — Y92009 Unspecified place in unspecified non-institutional (private) residence as the place of occurrence of the external cause: Secondary | ICD-10-CM

## 2012-10-23 DIAGNOSIS — F17200 Nicotine dependence, unspecified, uncomplicated: Secondary | ICD-10-CM | POA: Diagnosis present

## 2012-10-23 DIAGNOSIS — M869 Osteomyelitis, unspecified: Secondary | ICD-10-CM | POA: Diagnosis present

## 2012-10-23 DIAGNOSIS — T8489XA Other specified complication of internal orthopedic prosthetic devices, implants and grafts, initial encounter: Principal | ICD-10-CM | POA: Diagnosis present

## 2012-10-23 DIAGNOSIS — M329 Systemic lupus erythematosus, unspecified: Secondary | ICD-10-CM | POA: Diagnosis present

## 2012-10-23 DIAGNOSIS — Y831 Surgical operation with implant of artificial internal device as the cause of abnormal reaction of the patient, or of later complication, without mention of misadventure at the time of the procedure: Secondary | ICD-10-CM | POA: Diagnosis present

## 2012-10-23 DIAGNOSIS — Z7901 Long term (current) use of anticoagulants: Secondary | ICD-10-CM

## 2012-10-23 DIAGNOSIS — D689 Coagulation defect, unspecified: Secondary | ICD-10-CM | POA: Diagnosis present

## 2012-10-23 HISTORY — DX: Unspecified osteoarthritis, unspecified site: M19.90

## 2012-10-23 HISTORY — PX: ANKLE FUSION: SHX881

## 2012-10-23 HISTORY — PX: ANKLE ARTHRODESIS: SUR49

## 2012-10-23 SURGERY — ARTHRODESIS ANKLE
Anesthesia: General | Site: Ankle | Laterality: Left | Wound class: Clean

## 2012-10-23 MED ORDER — MIDAZOLAM HCL 5 MG/5ML IJ SOLN
INTRAMUSCULAR | Status: DC | PRN
  Administered 2012-10-23: 2 mg via INTRAVENOUS

## 2012-10-23 MED ORDER — METOCLOPRAMIDE HCL 10 MG PO TABS: 5.0000 mg | ORAL_TABLET | Freq: Three times a day (TID) | ORAL | Status: DC | PRN

## 2012-10-23 MED ORDER — METOCLOPRAMIDE HCL 5 MG/ML IJ SOLN: 5.0000 mg | Freq: Three times a day (TID) | INTRAMUSCULAR | Status: DC | PRN

## 2012-10-23 MED ORDER — DOCUSATE SODIUM 100 MG PO CAPS
100.0000 mg | ORAL_CAPSULE | Freq: Two times a day (BID) | ORAL | Status: DC
  Administered 2012-10-23 – 2012-10-25 (×4): 100 mg via ORAL
  Filled 2012-10-23 (×5): qty 1

## 2012-10-23 MED ORDER — SODIUM CHLORIDE 0.9 % IJ SOLN: 9.0000 mL | INTRAMUSCULAR | Status: DC | PRN

## 2012-10-23 MED ORDER — MIDAZOLAM HCL 2 MG/2ML IJ SOLN
INTRAMUSCULAR | Status: AC
  Filled 2012-10-23: qty 2

## 2012-10-23 MED ORDER — OXYCODONE HCL 40 MG PO TB12: 40.0000 mg | ORAL_TABLET | Freq: Two times a day (BID) | ORAL | Status: DC

## 2012-10-23 MED ORDER — METHOCARBAMOL 500 MG PO TABS
1000.0000 mg | ORAL_TABLET | Freq: Four times a day (QID) | ORAL | Status: DC
  Administered 2012-10-23 – 2012-10-25 (×6): 1000 mg via ORAL
  Filled 2012-10-23 (×11): qty 2
  Filled 2012-10-23: qty 1
  Filled 2012-10-23 (×2): qty 2

## 2012-10-23 MED ORDER — LIDOCAINE HCL (CARDIAC) 20 MG/ML IV SOLN
INTRAVENOUS | Status: DC | PRN
  Administered 2012-10-23: 60 mg via INTRAVENOUS

## 2012-10-23 MED ORDER — ENOXAPARIN SODIUM 100 MG/ML ~~LOC~~ SOLN
100.0000 mg | Freq: Two times a day (BID) | SUBCUTANEOUS | Status: DC
  Administered 2012-10-23 – 2012-10-25 (×4): 100 mg via SUBCUTANEOUS
  Filled 2012-10-23 (×5): qty 1

## 2012-10-23 MED ORDER — KETOROLAC TROMETHAMINE 30 MG/ML IJ SOLN: 15.0000 mg | Freq: Once | INTRAMUSCULAR | Status: DC | PRN

## 2012-10-23 MED ORDER — DIPHENHYDRAMINE HCL 12.5 MG/5ML PO ELIX: 12.5000 mg | ORAL_SOLUTION | ORAL | Status: DC | PRN

## 2012-10-23 MED ORDER — ONDANSETRON HCL 4 MG/2ML IJ SOLN: 4.0000 mg | Freq: Four times a day (QID) | INTRAMUSCULAR | Status: DC | PRN

## 2012-10-23 MED ORDER — SENNOSIDES-DOCUSATE SODIUM 8.6-50 MG PO TABS: 1.0000 | ORAL_TABLET | Freq: Every evening | ORAL | Status: DC | PRN

## 2012-10-23 MED ORDER — CEFAZOLIN SODIUM-DEXTROSE 2-3 GM-% IV SOLR
2.0000 g | Freq: Once | INTRAVENOUS | Status: AC
  Administered 2012-10-23: 2 g via INTRAVENOUS

## 2012-10-23 MED ORDER — NALOXONE HCL 0.4 MG/ML IJ SOLN: 0.4000 mg | INTRAMUSCULAR | Status: DC | PRN

## 2012-10-23 MED ORDER — METHOCARBAMOL 100 MG/ML IJ SOLN
1000.0000 mg | Freq: Four times a day (QID) | INTRAVENOUS | Status: DC
  Administered 2012-10-23: 1000 mg via INTRAVENOUS
  Filled 2012-10-23 (×10): qty 10

## 2012-10-23 MED ORDER — WARFARIN - PHARMACIST DOSING INPATIENT: Freq: Every day | Status: DC

## 2012-10-23 MED ORDER — WARFARIN SODIUM 7.5 MG PO TABS
15.0000 mg | ORAL_TABLET | Freq: Once | ORAL | Status: AC
  Administered 2012-10-23: 15 mg via ORAL
  Filled 2012-10-23: qty 2

## 2012-10-23 MED ORDER — HYDROMORPHONE HCL PF 1 MG/ML IJ SOLN
INTRAMUSCULAR | Status: AC
  Filled 2012-10-23: qty 1

## 2012-10-23 MED ORDER — DIPHENHYDRAMINE HCL 50 MG/ML IJ SOLN: 12.5000 mg | Freq: Four times a day (QID) | INTRAMUSCULAR | Status: DC | PRN

## 2012-10-23 MED ORDER — PROPOFOL 10 MG/ML IV BOLUS
INTRAVENOUS | Status: DC | PRN
  Administered 2012-10-23: 200 mg via INTRAVENOUS

## 2012-10-23 MED ORDER — HYDROMORPHONE HCL PF 1 MG/ML IJ SOLN: 0.5000 mg | INTRAMUSCULAR | Status: DC | PRN

## 2012-10-23 MED ORDER — POTASSIUM CHLORIDE IN NACL 20-0.9 MEQ/L-% IV SOLN
INTRAVENOUS | Status: DC
  Administered 2012-10-23: 18:00:00 via INTRAVENOUS
  Filled 2012-10-23 (×6): qty 1000

## 2012-10-23 MED ORDER — PREGABALIN 50 MG PO CAPS
150.0000 mg | ORAL_CAPSULE | Freq: Two times a day (BID) | ORAL | Status: DC
  Administered 2012-10-23 – 2012-10-25 (×4): 150 mg via ORAL
  Filled 2012-10-23 (×4): qty 3

## 2012-10-23 MED ORDER — LACTATED RINGERS IV SOLN
INTRAVENOUS | Status: DC
  Administered 2012-10-23: 11:00:00 via INTRAVENOUS

## 2012-10-23 MED ORDER — FENTANYL CITRATE 0.05 MG/ML IJ SOLN
INTRAMUSCULAR | Status: AC
  Filled 2012-10-23: qty 2

## 2012-10-23 MED ORDER — DIPHENHYDRAMINE HCL 12.5 MG/5ML PO ELIX: 12.5000 mg | ORAL_SOLUTION | Freq: Four times a day (QID) | ORAL | Status: DC | PRN

## 2012-10-23 MED ORDER — FENTANYL CITRATE 0.05 MG/ML IJ SOLN
INTRAMUSCULAR | Status: DC | PRN
  Administered 2012-10-23: 50 ug via INTRAVENOUS
  Administered 2012-10-23: 100 ug via INTRAVENOUS
  Administered 2012-10-23 (×4): 50 ug via INTRAVENOUS
  Administered 2012-10-23: 100 ug via INTRAVENOUS
  Administered 2012-10-23: 50 ug via INTRAVENOUS

## 2012-10-23 MED ORDER — FLEET ENEMA 7-19 GM/118ML RE ENEM: 1.0000 | ENEMA | Freq: Once | RECTAL | Status: AC | PRN

## 2012-10-23 MED ORDER — HYDROMORPHONE 0.3 MG/ML IV SOLN
INTRAVENOUS | Status: DC
  Administered 2012-10-23: 17:00:00 via INTRAVENOUS
  Administered 2012-10-23: 3.6 mg via INTRAVENOUS
  Administered 2012-10-24: 3.58 mg via INTRAVENOUS
  Administered 2012-10-24: via INTRAVENOUS
  Administered 2012-10-24: 3.3 mg via INTRAVENOUS
  Administered 2012-10-24: 2.04 mg via INTRAVENOUS
  Filled 2012-10-23: qty 25

## 2012-10-23 MED ORDER — HYDROMORPHONE 0.3 MG/ML IV SOLN
INTRAVENOUS | Status: AC
  Filled 2012-10-23: qty 25

## 2012-10-23 MED ORDER — 0.9 % SODIUM CHLORIDE (POUR BTL) OPTIME
TOPICAL | Status: DC | PRN
  Administered 2012-10-23: 1000 mL

## 2012-10-23 MED ORDER — ACETAMINOPHEN 10 MG/ML IV SOLN
1000.0000 mg | Freq: Four times a day (QID) | INTRAVENOUS | Status: AC
  Administered 2012-10-23 – 2012-10-24 (×4): 1000 mg via INTRAVENOUS
  Filled 2012-10-23 (×5): qty 100

## 2012-10-23 MED ORDER — PNEUMOCOCCAL VAC POLYVALENT 25 MCG/0.5ML IJ INJ
0.5000 mL | INJECTION | INTRAMUSCULAR | Status: AC
  Administered 2012-10-24: 0.5 mL via INTRAMUSCULAR
  Filled 2012-10-23: qty 0.5

## 2012-10-23 MED ORDER — OXYCODONE HCL ER 15 MG PO T12A
40.0000 mg | EXTENDED_RELEASE_TABLET | Freq: Two times a day (BID) | ORAL | Status: DC
  Administered 2012-10-23 – 2012-10-25 (×4): 40 mg via ORAL
  Filled 2012-10-23 (×4): qty 2
  Filled 2012-10-23: qty 4

## 2012-10-23 MED ORDER — PROMETHAZINE HCL 25 MG/ML IJ SOLN: 6.2500 mg | INTRAMUSCULAR | Status: DC | PRN

## 2012-10-23 MED ORDER — ONDANSETRON HCL 4 MG PO TABS: 4.0000 mg | ORAL_TABLET | Freq: Four times a day (QID) | ORAL | Status: DC | PRN

## 2012-10-23 MED ORDER — LACTATED RINGERS IV SOLN
INTRAVENOUS | Status: DC | PRN
  Administered 2012-10-23 (×2): via INTRAVENOUS

## 2012-10-23 MED ORDER — HYDROMORPHONE HCL PF 1 MG/ML IJ SOLN
0.2500 mg | INTRAMUSCULAR | Status: DC | PRN
  Administered 2012-10-23 (×3): 0.5 mg via INTRAVENOUS

## 2012-10-23 MED ORDER — MEPERIDINE HCL 25 MG/ML IJ SOLN: 6.2500 mg | INTRAMUSCULAR | Status: DC | PRN

## 2012-10-23 MED ORDER — PREGABALIN 50 MG PO CAPS: 150.0000 mg | ORAL_CAPSULE | Freq: Two times a day (BID) | ORAL | Status: DC

## 2012-10-23 MED ORDER — BISACODYL 10 MG RE SUPP: 10.0000 mg | Freq: Every day | RECTAL | Status: DC | PRN

## 2012-10-23 MED ORDER — CEFAZOLIN SODIUM 1-5 GM-% IV SOLN
1.0000 g | Freq: Three times a day (TID) | INTRAVENOUS | Status: AC
  Administered 2012-10-23 – 2012-10-24 (×3): 1 g via INTRAVENOUS
  Filled 2012-10-23 (×3): qty 50

## 2012-10-23 MED ORDER — MIDAZOLAM HCL 2 MG/2ML IJ SOLN
1.0000 mg | INTRAMUSCULAR | Status: DC | PRN
  Administered 2012-10-23: 2 mg via INTRAVENOUS

## 2012-10-23 MED ORDER — FENTANYL CITRATE 0.05 MG/ML IJ SOLN
50.0000 ug | INTRAMUSCULAR | Status: DC | PRN
  Administered 2012-10-23: 100 ug via INTRAVENOUS

## 2012-10-23 MED ORDER — MIDAZOLAM HCL 2 MG/2ML IJ SOLN: 0.5000 mg | Freq: Once | INTRAMUSCULAR | Status: DC | PRN

## 2012-10-23 MED ORDER — OXYCODONE HCL 5 MG PO TABS
10.0000 mg | ORAL_TABLET | ORAL | Status: DC | PRN
  Administered 2012-10-24 – 2012-10-25 (×6): 10 mg via ORAL
  Filled 2012-10-23 (×6): qty 2

## 2012-10-23 MED ORDER — CEFAZOLIN SODIUM-DEXTROSE 2-3 GM-% IV SOLR
INTRAVENOUS | Status: AC
  Filled 2012-10-23: qty 50

## 2012-10-23 SURGICAL SUPPLY — 75 items
BANDAGE ELASTIC 4 VELCRO ST LF (GAUZE/BANDAGES/DRESSINGS) ×2 IMPLANT
BANDAGE ELASTIC 6 VELCRO ST LF (GAUZE/BANDAGES/DRESSINGS) ×2 IMPLANT
BANDAGE ESMARK 6X9 LF (GAUZE/BANDAGES/DRESSINGS) ×1 IMPLANT
BANDAGE GAUZE ELAST BULKY 4 IN (GAUZE/BANDAGES/DRESSINGS) ×2 IMPLANT
BIT DRILL Q COUPLING 4.5 (BIT) ×2 IMPLANT
BIT DRILL Q/COUPLING 1 (BIT) ×2 IMPLANT
BLADE SURG 10 STRL SS (BLADE) IMPLANT
BLADE SURG 15 STRL LF DISP TIS (BLADE) IMPLANT
BLADE SURG 15 STRL SS (BLADE)
BNDG COHESIVE 4X5 TAN STRL (GAUZE/BANDAGES/DRESSINGS) IMPLANT
BNDG ESMARK 6X9 LF (GAUZE/BANDAGES/DRESSINGS) ×2
BONE CANC CHIPS 40CC CAN1/2 (Bone Implant) ×2 IMPLANT
BRUSH SCRUB DISP (MISCELLANEOUS) ×4 IMPLANT
CANISTER SUCTION 2500CC (MISCELLANEOUS) ×2 IMPLANT
CHIPS CANC BONE 40CC CAN1/2 (Bone Implant) ×1 IMPLANT
CLOTH BEACON ORANGE TIMEOUT ST (SAFETY) ×2 IMPLANT
COVER MAYO STAND STRL (DRAPES) ×2 IMPLANT
COVER SURGICAL LIGHT HANDLE (MISCELLANEOUS) ×2 IMPLANT
CUFF TOURNIQUET SINGLE 34IN LL (TOURNIQUET CUFF) ×2 IMPLANT
DRAPE C-ARM 42X72 X-RAY (DRAPES) ×2 IMPLANT
DRAPE C-ARMOR (DRAPES) ×2 IMPLANT
DRAPE ORTHO SPLIT 77X108 STRL (DRAPES)
DRAPE SURG ORHT 6 SPLT 77X108 (DRAPES) IMPLANT
DRAPE U-SHAPE 47X51 STRL (DRAPES) ×2 IMPLANT
DRSG ADAPTIC 3X8 NADH LF (GAUZE/BANDAGES/DRESSINGS) ×4 IMPLANT
DRSG EMULSION OIL 3X3 NADH (GAUZE/BANDAGES/DRESSINGS) IMPLANT
ELECT REM PT RETURN 9FT ADLT (ELECTROSURGICAL) ×2
ELECTRODE REM PT RTRN 9FT ADLT (ELECTROSURGICAL) ×1 IMPLANT
GLOVE BIO SURGEON STRL SZ7 (GLOVE) ×2 IMPLANT
GLOVE BIO SURGEON STRL SZ7.5 (GLOVE) ×2 IMPLANT
GLOVE BIO SURGEON STRL SZ8 (GLOVE) ×2 IMPLANT
GLOVE BIOGEL PI IND STRL 7.5 (GLOVE) ×2 IMPLANT
GLOVE BIOGEL PI IND STRL 8 (GLOVE) ×1 IMPLANT
GLOVE BIOGEL PI INDICATOR 7.5 (GLOVE) ×2
GLOVE BIOGEL PI INDICATOR 8 (GLOVE) ×1
GLOVE SURG SS PI 8.0 STRL IVOR (GLOVE) ×2 IMPLANT
GOWN PREVENTION PLUS XLARGE (GOWN DISPOSABLE) ×2 IMPLANT
GOWN STRL NON-REIN LRG LVL3 (GOWN DISPOSABLE) ×4 IMPLANT
GOWN STRL REIN 2XL XLG LVL4 (GOWN DISPOSABLE) ×2 IMPLANT
KIT BASIN OR (CUSTOM PROCEDURE TRAY) ×2 IMPLANT
KIT INFUSE LRG II (Orthopedic Implant) ×2 IMPLANT
KIT ROOM TURNOVER OR (KITS) ×2 IMPLANT
MANIFOLD NEPTUNE II (INSTRUMENTS) IMPLANT
NS IRRIG 1000ML POUR BTL (IV SOLUTION) ×2 IMPLANT
PACK ORTHO EXTREMITY (CUSTOM PROCEDURE TRAY) ×2 IMPLANT
PAD ARMBOARD 7.5X6 YLW CONV (MISCELLANEOUS) ×2 IMPLANT
PAD CAST 4YDX4 CTTN HI CHSV (CAST SUPPLIES) ×1 IMPLANT
PADDING CAST COTTON 4X4 STRL (CAST SUPPLIES) ×1
PADDING CAST COTTON 6X4 STRL (CAST SUPPLIES) ×2 IMPLANT
PENCIL BUTTON HOLSTER BLD 10FT (ELECTRODE) IMPLANT
SCREW CANC 32T 6.5 100 (Screw) ×2 IMPLANT
SCREW CANC 32T 6.5 80 (Screw) ×2 IMPLANT
SCREW CORTEX ST 4.5X32 (Screw) ×2 IMPLANT
SCREW CORTEX ST 4.5X34 (Screw) ×2 IMPLANT
SCREW CORTEX ST 4.5X40 (Screw) ×2 IMPLANT
SLEEVE SURGEON STRL (DRAPES) ×2 IMPLANT
SPONGE GAUZE 4X4 12PLY (GAUZE/BANDAGES/DRESSINGS) IMPLANT
SPONGE LAP 18X18 X RAY DECT (DISPOSABLE) ×2 IMPLANT
SPONGE SCRUB IODOPHOR (GAUZE/BANDAGES/DRESSINGS) IMPLANT
SUCTION FRAZIER TIP 10 FR DISP (SUCTIONS) ×2 IMPLANT
SUT ETHILON 2 0 FS 18 (SUTURE) ×2 IMPLANT
SUT ETHILON 3 0 PS 1 (SUTURE) ×4 IMPLANT
SUT PDS AB 0 CT 36 (SUTURE) ×2 IMPLANT
SUT VIC AB 2-0 CT3 27 (SUTURE) ×2 IMPLANT
SUT VIC AB 2-0 SH 18 (SUTURE) IMPLANT
SUT VIC AB 3-0 FS2 27 (SUTURE) IMPLANT
SYR 5ML LL (SYRINGE) ×2 IMPLANT
SYR 5ML LUER SLIP (SYRINGE) ×2 IMPLANT
SYSTEM CHEST DRAIN TLS 7FR (DRAIN) IMPLANT
TOWEL OR 17X24 6PK STRL BLUE (TOWEL DISPOSABLE) ×2 IMPLANT
TOWEL OR 17X26 10 PK STRL BLUE (TOWEL DISPOSABLE) ×4 IMPLANT
TUBE CONNECTING 12X1/4 (SUCTIONS) IMPLANT
UNDERPAD 30X30 INCONTINENT (UNDERPADS AND DIAPERS) ×2 IMPLANT
WATER STERILE IRR 1000ML POUR (IV SOLUTION) IMPLANT
plate (Plate) ×2 IMPLANT

## 2012-10-23 NOTE — Brief Op Note (Signed)
10/23/2012  4:01 PM  PATIENT:  Crystal Frank  46 y.o. 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  PROCEDURE:  Procedure(s) (LRB) with comments: ARTHRODESIS ANKLE (Left) - REMOVAL RETAINED ANTIBIOTIC NAIL LEFT TIBIA/TIBIO TALAR FUSION  SURGEON:  Surgeon(s) and Role:    * Budd Palmer, MD - Primary  PHYSICIAN ASSISTANT: None  ANESTHESIA:   regional plus general  EBL:  Total I/O In: 1800 [I.V.:1800] Out: -   BLOOD ADMINISTERED:none  DRAINS: none   LOCAL MEDICATIONS USED:  NONE  SPECIMEN:  No Specimen  DISPOSITION OF SPECIMEN:  N/A  COUNTS:  YES  TOURNIQUET:   Total Tourniquet Time Documented: Thigh (Left) - 112 minutes  DICTATION: .Other Dictation: Dictation Number [id number]  PLAN OF CARE: Admit to inpatient   PATIENT DISPOSITION:  PACU - hemodynamically stable.   Delay start of Pharmacological VTE agent (>24hrs) due to surgical blood loss or risk of bleeding: no

## 2012-10-23 NOTE — Interval H&P Note (Signed)
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 After consideration of risks, benefits and other options for treatment, the patient has consented to  Procedure(s) (LRB) with comments: ARTHRODESIS ANKLE (Left) - REMOVAL RETAINED ANTIBIOTIC NAIL LEFT TIBIA/TIBIO TALAR FUSION as a surgical intervention .  The patient's history has been reviewed, patient examined, no change in status, stable for surgery.  I have reviewed the patient's chart and labs.  Questions were answered to the patient's satisfaction.    No interval changes in pts H&P from November.  Presents today after abx treatment PO and local with abx nail for fusion.    H&P as per previous note  Plan    OR for [facility] L ankle   L tibiotalar fusion   Admit for pain control and therapies   Lovenox for dvt/pe prophylaxis, coumadin after surgery due clotting disorder  Mearl Latin, PA-C [facility] [phone] (P) 10/23/2012 12:11 PM

## 2012-10-23 NOTE — Anesthesia Preprocedure Evaluation (Addendum)
Anesthesia Evaluation  Patient identified by MRN, date of birth, ID band Patient awake    History of Anesthesia Complications Negative for: history of anesthetic complications  Airway Mallampati: I TM Distance: >3 FB Neck ROM: Full    Dental  (+) Teeth Intact and Dental Advisory Given   Pulmonary former smoker,  H/o PE: off coumadin, INR 0.96 breath sounds clear to auscultation  Pulmonary exam normal       Cardiovascular negative cardio ROS  Rhythm:Regular Rate:Normal     Neuro/Psych PSYCHIATRIC DISORDERS Anxiety negative neurological ROS     GI/Hepatic Neg liver ROS, GERD-  Medicated and Poorly Controlled,  Endo/Other  Morbid obesity  Renal/GU negative Renal ROS     Musculoskeletal   Abdominal (+) + obese,   Peds  Hematology negative hematology ROS (+)   Anesthesia Other Findings   Reproductive/Obstetrics                           Anesthesia Physical  Anesthesia Plan  ASA: II  Anesthesia Plan: General   Post-op Pain Management:    Induction: Intravenous  Airway Management Planned: LMA  Additional Equipment:   Intra-op Plan:   Post-operative Plan: Extubation in OR  Informed Consent: I have reviewed the patients History and Physical, chart, labs and discussed the procedure including the risks, benefits and alternatives for the proposed anesthesia with the patient or authorized representative who has indicated his/her understanding and acceptance.   Dental advisory given  Plan Discussed with: CRNA and Surgeon  Anesthesia Plan Comments: (Plan routine monitors, GETA)       Anesthesia Quick Evaluation

## 2012-10-23 NOTE — Progress Notes (Signed)
Dr. Magdalene Patricia office notified that we do not have orders for consent.

## 2012-10-23 NOTE — Transfer of Care (Signed)
Immediate Anesthesia Transfer of Care Note  Patient: Crystal Frank  Procedure(s) Performed: Procedure(s) (LRB) with comments: ARTHRODESIS ANKLE (Left) - REMOVAL RETAINED ANTIBIOTIC NAIL LEFT TIBIA/TIBIO TALAR FUSION  Patient Location: PACU  Anesthesia Type:General  Level of Consciousness: awake, sedated, patient cooperative and confused  Airway & Oxygen Therapy: Patient Spontanous Breathing and Patient connected to face mask oxygen  Post-op Assessment: Report given to PACU RN, Post -op Vital signs reviewed and stable and Patient moving all extremities  Post vital signs: Reviewed and stable  Complications: No apparent anesthesia complications
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
## 2012-10-23 NOTE — Anesthesia Postprocedure Evaluation (Signed)
Anesthesia Post Note  Patient: Crystal Frank  Procedure(s) Performed: Procedure(s) (LRB): ARTHRODESIS ANKLE (Left)  Anesthesia type: GA  Patient location: PACU  Post pain: Pain level controlled  Post assessment: Post-op Vital signs reviewed  Last Vitals:  Filed Vitals:   10/23/12 1600  BP:   Pulse: 116  Temp:   Resp: 25    Post vital signs: Reviewed  Level of consciousness: sedated  Complications: No apparent anesthesia complications

## 2012-10-23 NOTE — Anesthesia Procedure Notes (Signed)
Anesthesia Regional Block:  Popliteal block  Pre-Anesthetic Checklist: ,, timeout performed, Correct Patient, Correct Site, Correct Laterality, Correct Procedure, Correct Position, site marked, Risks and benefits discussed,  Surgical consent,  Pre-op evaluation,  At surgeon's request and post-op pain management   Prep: chloraprep       Needles:  Injection technique: Single-shot  Needle Type: Stimiplex          Additional Needles:  Procedures: ultrasound guided (picture in chart) Popliteal block Narrative:  Start time: 10/23/2012 11:41 AM End time: 10/23/2012 11:46 AM Injection made incrementally with aspirations every 5 mL.  Performed by: Personally  Anesthesiologist: Brayton Caves  Additional Notes: Risks, benefits and alternative to block explained extensively.  Patient tolerated procedure well, without complications.  Supraclavicular block

## 2012-10-23 NOTE — Progress Notes (Signed)
ANTICOAGULATION CONSULT NOTE - Initial Consult  Pharmacy Consult for Lovenox/Coumadin Indication:  h/o clotting disorder with PE 2008.  No Known Allergies  Patient Measurements:   Heparin Dosing Weight:    Vital Signs: Temp: 97.8 F (36.6 C) (12/03 1732) Temp src: Oral (12/03 1732) BP: 129/84 mmHg (12/03 1732) Pulse Rate: 106  (12/03 1732)  Labs:  Basename 10/22/12 1154  HGB 15.0  HCT 43.4  PLT 221  APTT 40*  LABPROT 13.6  INR 1.05  HEPARINUNFRC --  CREATININE 0.81  CKTOTAL --  CKMB --  TROPONINI --    The CrCl is unknown because both a height and weight (above a minimum accepted value) are required for this calculation.   Medical History: Past Medical History  Diagnosis Date  . Pulmonary embolism 2008    seen at [facility]--right lung  . Lupus   . Pleurisy   . 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 Nonunion of L tibiotalar fusion 09/26/2012    Medications:  Prescriptions prior to admission  Medication Sig Dispense Refill  . doxycycline (VIBRAMYCIN) 50 MG capsule Take 2 capsules (100 mg total) by mouth 2 (two) times daily.  60 capsule  1  . enoxaparin (LOVENOX) 100 MG/ML injection Inject 1 mL (100 mg total) into the skin every 12 (twelve) hours.  28 Syringe  1  . levofloxacin (LEVAQUIN) 750 MG tablet Take 1 tablet (750 mg total) by mouth daily.  30 tablet  1  . pregabalin (LYRICA) 150 MG capsule Take 150 mg by mouth 2 (two) times daily.      Marland Kitchen acetaminophen (TYLENOL) 500 MG tablet Take 500-1,000 mg by mouth every 6 (six) hours as needed. For pain or fever      . docusate sodium (COLACE) 100 MG capsule Take 100 mg by mouth 2 (two) times daily as needed. For constipation      . oxyCODONE (OXYCONTIN) 40 MG 12 hr tablet Take 40 mg by mouth every 12 (twelve) hours.      . Oxycodone HCl 10 MG TABS Take 10 mg by mouth every 3 (three) hours as needed. For breakthrough pain; not to exceed 5 tablets per day      . warfarin (COUMADIN) 5 MG tablet Take 10-12.5 mg by mouth daily.  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  Anticoag: h/o clotting disorder on chronic Coumadin at home (discontinued prior to procedure) while overlapping with full-dose Lovenox. Hgb this am 15. Scr 0.81. Last INR 12/2 was 1.05.   Goal of Therapy:  INR 2-3 Monitor platelets by anticoagulation protocol: Yes   Plan:  Resume Lovenox 100mg  sq q12h Coumadin 15mg  po x 1 tonight. Daily PT/INR and CBC q72h.  Merilynn Finland, [employer] 10/23/2012,5:49 PM

## 2012-10-24 ENCOUNTER — Encounter (HOSPITAL_COMMUNITY): Payer: Self-pay | Admitting: General Practice

## 2012-10-24 LAB — BASIC METABOLIC PANEL
BUN: 10 mg/dL (ref 6–23)
CO2: 26 mEq/L (ref 19–32)
Calcium: 8.5 mg/dL (ref 8.4–10.5)
Chloride: 102 mEq/L (ref 96–112)
Creatinine, Ser: 0.91 mg/dL (ref 0.50–1.10)
GFR calc Af Amer: 88 mL/min — ABNORMAL LOW (ref >90)
GFR calc non Af Amer: 76 mL/min — ABNORMAL LOW (ref >90)
Glucose, Bld: 138 mg/dL — ABNORMAL HIGH (ref 70–99)
Potassium: 4.5 mEq/L (ref 3.5–5.1)
Sodium: 136 mEq/L (ref 135–145)

## 2012-10-24 LAB — PROTIME-INR
INR: 1.28 (ref 0.00–1.49)
Prothrombin Time: 15.7 seconds — ABNORMAL HIGH (ref 11.6–15.2)

## 2012-10-24 LAB — CBC
HCT: 34.7 % — ABNORMAL LOW (ref 36.0–46.0)
Hemoglobin: 11.5 g/dL — ABNORMAL LOW (ref 12.0–15.0)
MCH: 27.6 pg (ref 26.0–34.0)
MCHC: 33.1 g/dL (ref 30.0–36.0)
MCV: 83.2 fL (ref 78.0–100.0)
Platelets: 150 10*3/uL (ref 150–400)
RBC: 4.17 MIL/uL (ref 3.87–5.11)
RDW: 16.2 % — ABNORMAL HIGH (ref 11.5–15.5)
WBC: 8.8 10*3/uL (ref 4.0–10.5)

## 2012-10-24 MED ORDER — ACETAMINOPHEN 10 MG/ML IV SOLN: 1000.0000 mg | Freq: Four times a day (QID) | INTRAVENOUS | Status: DC

## 2012-10-24 MED ORDER — ACETAMINOPHEN 500 MG PO TABS
1000.0000 mg | ORAL_TABLET | Freq: Four times a day (QID) | ORAL | Status: DC
  Administered 2012-10-24 – 2012-10-25 (×3): 1000 mg via ORAL
  Filled 2012-10-24 (×4): qty 2

## 2012-10-24 MED ORDER — WARFARIN SODIUM 7.5 MG PO TABS
15.0000 mg | ORAL_TABLET | Freq: Once | ORAL | Status: AC
  Administered 2012-10-24: 15 mg via ORAL
  Filled 2012-10-24: qty 2

## 2012-10-24 MED ORDER — HYDROMORPHONE HCL PF 1 MG/ML IJ SOLN
0.5000 mg | INTRAMUSCULAR | Status: DC | PRN
  Administered 2012-10-24 – 2012-10-25 (×10): 1 mg via INTRAVENOUS
  Filled 2012-10-24 (×11): qty 1

## 2012-10-24 NOTE — Progress Notes (Signed)
UR COMPLETED  

## 2012-10-24 NOTE — Progress Notes (Signed)
Occupational Therapy Evaluation Patient Details Name: Crystal Frank MRN: [id number] DOB: 25-Oct-1966 Today's Date: 10/24/2012 Time: 4098-1191 OT Time Calculation (min): 15 min  OT Assessment / Plan / Recommendation Clinical Impression  46 yo s/p L ankle athrodesis. Significant medical history with L ankle. Pt has all DME needed for safe D/C home. Pt has roommate and 24/7 S. Pt given a reacher to assist with ADL. No further OT indicated at this time.    OT Assessment  Patient does not need any further OT services    Follow Up Recommendations  No OT follow up    Barriers to Discharge  none    Equipment Recommendations  Wheelchair (measurements) (standard with elevating left leg rest)    Recommendations for Other Services  none  Frequency    eval only   Precautions / Restrictions Precautions Precautions: Fall Required Braces or Orthoses: Other Brace/Splint (L leg splint) Restrictions Weight Bearing Restrictions: Yes LLE Weight Bearing: Non weight bearing Other Position/Activity Restrictions: elevate LLE   Pertinent Vitals/Pain 4    ADL  Grooming: Modified independent Where Assessed - Grooming: Unsupported standing Upper Body Bathing: Modified independent Where Assessed - Upper Body Bathing: Unsupported sitting Lower Body Bathing: Modified independent Where Assessed - Lower Body Bathing: Unsupported sit to stand Upper Body Dressing: Modified independent Where Assessed - Upper Body Dressing: Unsupported sitting Lower Body Dressing: Modified independent Where Assessed - Lower Body Dressing: Unsupported sit to stand Toilet Transfer: Modified independent Toilet Transfer Method: Sit to Barista: Bedside commode Toileting - Clothing Manipulation and Hygiene: Modified independent Where Assessed - Engineer, mining and Hygiene: Sit to stand from 3-in-1 or toilet Equipment Used: Gait belt;Rolling walker Transfers/Ambulation Related to ADLs:  mod I ADL Comments: given reacher to assist with ADL and IADL tasks    OT Diagnosis:    OT Problem List:   OT Treatment Interventions:     OT Goals Acute Rehab OT Goals OT Goal Formulation:  (eval only)  Visit Information  Last OT Received On: 10/24/12 Assistance Needed: +1    Subjective Data      Prior Functioning     Home Living Lives With: Significant other Available Help at Discharge: Available 24 hours/day Type of Home: House Home Access: Stairs to enter Secretary/administrator of Steps: 4 Entrance Stairs-Rails: Can reach both Home Layout: One level Bathroom Shower/Tub: Engineer, manufacturing systems: Standard Bathroom Accessibility: Yes How Accessible: Accessible via walker;Accessible via wheelchair Home Adaptive Equipment: Bedside commode/3-in-1;Walker - rolling;Wheelchair - manual Additional Comments: pt reports that her w/c is her oldest piece of equipment and both arms are broken and the leg rests are missing. Recommend new w/c with elevating left leg rest Prior Function Level of Independence: Independent with assistive device(s) Able to Take Stairs?: Yes Driving: No Vocation: Workers comp Comments: pt hops up stairs using rail Communication Communication: No difficulties Dominant Hand: Right         Vision/Perception     Cognition  Overall Cognitive Status: Appears within functional limits for tasks assessed/performed Arousal/Alertness: Awake/alert Orientation Level: Appears intact for tasks assessed Behavior During Session: Other (comment) (slightly impulsive) Cognition - Other Comments: pt slightly impulsive with speech and mobility, reports that she knows she tends to rush and knows when to be careful    Extremity/Trunk Assessment Right Upper Extremity Assessment RUE ROM/Strength/Tone: Deficits RUE ROM/Strength/Tone Deficits: c/o bursitis in B shoulders at times RUE Sensation: WFL - Light Touch;WFL - Proprioception RUE Coordination: WFL -  gross/fine motor Left Upper  Extremity Assessment LUE ROM/Strength/Tone: Deficits;Due to pain (? bursitis) LUE Sensation: WFL - Light Touch;WFL - Proprioception LUE Coordination: WFL - gross/fine motor Right Lower Extremity Assessment RLE ROM/Strength/Tone: Within functional levels RLE Sensation: WFL - Light Touch;WFL - Proprioception RLE Coordination: WFL - gross motor Left Lower Extremity Assessment LLE ROM/Strength/Tone: Deficits;Due to precautions LLE ROM/Strength/Tone Deficits: hip and knee appear WFL, able to wiggle toes and with good color  left leg and toes LLE Sensation: WFL - Light Touch;WFL - Proprioception (hip and knee) LLE Coordination: WFL - gross motor Trunk Assessment Trunk Assessment: Normal     Mobility Bed Mobility Bed Mobility: Supine to Sit;Sitting - Scoot to Edge of Bed Supine to Sit: 7: Independent Sitting - Scoot to Edge of Bed: 7: Independent Transfers Transfers: Sit to Stand;Stand to Sit Sit to Stand: 6: Modified independent (Device/Increase time);From bed Stand to Sit: 6: Modified independent (Device/Increase time);To chair/3-in-1 Details for Transfer Assistance: pt able to perform sit to stand without physical assist, vc's for taking her time but no unsafe mvmvts made     Shoulder Instructions     Exercise General Exercises - Lower Extremity Ankle Circles/Pumps: AROM;Right;10 reps Quad Sets: Left;AROM;10 reps Straight Leg Raises: AROM;10 reps;Left   Balance Balance Balance Assessed: Yes Dynamic Standing Balance Dynamic Standing - Balance Support: Bilateral upper extremity supported;During functional activity Dynamic Standing - Level of Assistance: 6: Modified independent (Device/Increase time)   End of Session OT - End of Session Equipment Utilized During Treatment: Gait belt Activity Tolerance: Patient tolerated treatment well Patient left: in chair;with call bell/phone within reach Nurse Communication: Mobility status  GO      Jaevon Paras,HILLARY 10/24/2012, 3:46 PM [facility], OTR/L  [phone]  10/24/2012

## 2012-10-24 NOTE — Progress Notes (Signed)
ANTICOAGULATION CONSULT NOTE - Follow up Consult  Pharmacy Consult for Lovenox/Coumadin Indication:  h/o clotting disorder with PE 2008. DVT px  No Known Allergies  Patient Measurements: Height: 5' 10.87" (180 cm) Weight: 223 lb 8.7 oz (101.4 kg) IBW/kg (Calculated) : 70.49   Vital Signs: Temp: 98.9 F (37.2 C) (12/04 0523) Temp src: Oral (12/04 0523) BP: 126/65 mmHg (12/04 0523) Pulse Rate: 80  (12/04 0523)  Labs:  Basename 10/24/12 0533 10/22/12 1154  HGB 11.5* 15.0  HCT 34.7* 43.4  PLT 150 221  APTT -- 40*  LABPROT 15.7* 13.6  INR 1.28 1.05  HEPARINUNFRC -- --  CREATININE 0.91 0.81  CKTOTAL -- --  CKMB -- --  TROPONINI -- --    Estimated Creatinine Clearance: 103.2 ml/min (by C-G formula based on Cr of 0.91).   Medical History: Past Medical History  Diagnosis Date  . Pulmonary embolism 2008    seen at [facility]--right lung  . Lupus   . Pleurisy   . 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 Arthritis   . Osteomyelitis     Medications:  Prescriptions prior to admission  Medication Sig Dispense Refill  . doxycycline (VIBRAMYCIN) 50 MG capsule Take 2 capsules (100 mg total) by mouth 2 (two) times daily.  60 capsule  1  . enoxaparin (LOVENOX) 100 MG/ML injection Inject 1 mL (100 mg total) into the skin every 12 (twelve) hours.  28 Syringe  1  . levofloxacin (LEVAQUIN) 750 MG tablet Take 1 tablet (750 mg total) by mouth daily.  30 tablet  1  . pregabalin (LYRICA) 150 MG capsule Take 150 mg by mouth 2 (two) times daily.      Marland Kitchen acetaminophen (TYLENOL) 500 MG tablet Take 500-1,000 mg by mouth every 6 (six) hours as needed. For pain or fever      . docusate sodium (COLACE) 100 MG capsule Take 100 mg by mouth 2 (two) times daily as needed. For constipation      . oxyCODONE (OXYCONTIN) 40 MG 12 hr tablet Take 40 mg by mouth every 12 (twelve) hours.      . Oxycodone HCl 10 MG TABS Take 10 mg by mouth every 3 (three) hours as needed.  For breakthrough pain; not to exceed 5 tablets per day      . warfarin (COUMADIN) 5 MG tablet Take 10-12.5 mg by mouth daily. Monday and Friday 12.5mg ;  all other days 10 mg        Assessment:  46 y/o female patient pod#1 s/p left ankle arthrodesis with h/o PE requiring anticoagulation for dvt px. INR subtherapeutic, full dose lovenox and coumadin started yesterday. Noted h/h drop, no bleeding reported.    Goal of Therapy:  INR 2-3 Monitor platelets by anticoagulation protocol: Yes   Plan:  Repeat coumadin 15mg  today and continue lovenox until INR>2.0. F/u daily protime.  Verlene Mayer, PharmD, BCPS Pager [phone] 10/24/2012,10:54 AM

## 2012-10-24 NOTE — Progress Notes (Addendum)
Inappropriate CSW consult. CSW will inform CM of the need for medication assistance/financial concerns. Clinical Social Worker will sign off for now as social work intervention is no longer needed. Please consult [place] again if new need arises.   Sabino Niemann, MSW [phone]

## 2012-10-24 NOTE — Progress Notes (Signed)
Orthopaedic Trauma Service (OTS)  Subjective: 1 Day Post-Op Procedure(s) (LRB): ARTHRODESIS ANKLE (Left)  Patient doing okay States the only thing that help her pain is her PCA Very concerned about how she is going to get her pain medicine once her workers comp expires. States that she will be unable to see her pain management doctor once her workers comp coverage expires as she will be unable to afford it Looking for alternatives for medicine coverage. Also concerned about the out-of-pocket cost of her current medications  denies chest pain, shortness of breath No abnormal pain, no nausea or vomiting Denies numbness or tingling in her left leg  Objective: Current Vitals Blood pressure 126/65, pulse 80, temperature 98.9 F (37.2 C), temperature source Oral, resp. rate 16, last menstrual period 09/24/2012, SpO2 100.00%. Vital signs in last 24 hours: Temp:  [97.6 F (36.4 C)-98.9 F (37.2 C)] 98.9 F (37.2 C) (12/04 0523) Pulse Rate:  [80-116] 80  (12/04 0523) Resp:  [13-25] 16  (12/04 0800) BP: (115-149)/(62-86) 126/65 mmHg (12/04 0523) SpO2:  [95 %-100 %] 100 % (12/04 0800)  Intake/Output from previous day: 12/03 0701 - 12/04 0700 In: 2631.3 [I.V.:2381.3; IV Piggyback:250] Out: 450 [Urine:450] Intake/Output      12/03 0701 - 12/04 0700 12/04 0701 - 12/05 0700   I.V. 2381.3    IV Piggyback 250    Total Intake 2631.3    Urine 450    Total Output 450    Net +2181.3           LABS  Basename 10/24/12 0533 10/22/12 1154  HGB 11.5* 15.0    Basename 10/24/12 0533 10/22/12 1154  WBC 8.8 11.0*  RBC 4.17 5.27*  HCT 34.7* 43.4  PLT 150 221    Basename 10/24/12 0533 10/22/12 1154  NA 136 140  K 4.5 4.0  CL 102 105  CO2 26 22  BUN 10 13  CREATININE 0.91 0.81  GLUCOSE 138* 99  CALCIUM 8.5 9.8    Basename 10/24/12 0533 10/22/12 1154  LABPT -- --  INR 1.28 1.05     Physical Exam  Gen: Awake and alert, resting comfortably in bed, eating Lungs: Clear  bilaterally Cardiac: regular, S1 and S2 Abd: + bowel sounds Ext:      Left lower extremity  Splint fitting well  Distal motor and sensory functions intact  No pain with passive stretch  Extremities warm  Swelling stable   Assessment/Plan: 1 Day Post-Op Procedure(s) (LRB): ARTHRODESIS ANKLE (Left)  45 year old female status post left ankle arthrodesis for nonunion  1. Left ankle arthrodesis postop day 1  Nonweightbearing for 6-8 weeks  Maintain splint until followup as an outpatient  Continue with ice and elevation  PT eval today 2. clotting disorder  Lovenox bridge to Coumadin   Patient has a history of being very unreliable with her medications as well as followup at the Coumadin clinic   we will attempt to get patient an adequate number of Lovenox injections for discharge to serve as an adequate bridge until her Coumadin is therapeutic. I would suspect the patient me no more than one week's worth.  3. chronic pain  As noted above in the subjective  We discussed our intentions ultimately get the patient off of her narcotics  We will contact Dr. Ollen Bowl office and discuss plan moving forward them to see if patient can be still seen at the office.  4. FEN  Diet as tolerated  5. Pain  DC PCA  Continue IV Tylenol  Patient on home meds  6. disposition   Therapies today   Stable for discharge tomorrow likely   Mearl Latin, PA-C [facility] [phone] (P) 10/24/2012, 9:39 AM

## 2012-10-24 NOTE — Progress Notes (Signed)
Orthopedic Tech Progress Note Patient Details:  Crystal Frank Jul 17, 1966 [id number]   TRAPEZE BAR   Crystal Frank, Crystal Frank 10/24/2012, 2:28 PM

## 2012-10-24 NOTE — Op Note (Signed)
NAMENAYANNA, SEABORN               ACCOUNT NO.:  [id number]  MEDICAL RECORD NO.:  [id number]  LOCATION:  [id number]                        FACILITY:  [facility]  PHYSICIAN:  Doralee Albino. Carola Frost, M.D. DATE OF BIRTH:  1966/10/17  DATE OF PROCEDURE:  10/23/2012 DATE OF DISCHARGE:                              OPERATIVE REPORT   PREOPERATIVE DIAGNOSIS:  Left ankle failed arthrodesis, osteomyelitis.  POSTOPERATIVE DIAGNOSIS:  Left ankle failed arthrodesis, osteomyelitis.  PROCEDURES: 1. Revision arthrodesis of the left ankle. 2. Removal of antibiotic spacer.  SURGEON:  Doralee Albino. Carola Frost, MD  ASSISTANT:  None.  ANESTHESIA:  General.  COMPLICATIONS:  None.  TOTAL TOURNIQUET TIME:  1 hour and 51 minutes.  DISPOSITION:  To PACU.  CONDITION:  Stable.  BRIEF SUMMARY AND INDICATION FOR PROCEDURE:  Crystal Frank is a 45 year old female who was undergoing revision arthrodesis of her ankle about 4 weeks ago when we encountered some purulent-looking material.  Because of concern about this in the light of her nonunion, we did opt for placement of an antibiotic spacer at that time rather than proceed with a large scale bone grafting.  She remained under the care of Infectious Disease as well as her pain managing physician and now returned to the OR for definitive repair.  She has had no sign of persistent infection and clinically has done well over the interval.  BRIEF SUMMARY OF PROCEDURE:  Crystal Frank was taken to the operating room after discussion of the risks and benefits, which she understood to include persistent nonunion, nerve injury, vessel injury, DVT, PE, particularly given her history of thromboembolic disease, symptomatic hardware, heart attack, stroke, and many others.  The left lower extremity was prepped and draped in usual sterile fashion after induction of general anesthesia, administration of 2 g of Ancef. The old anterior incision was reopened, dissection carried down to  the anterior tibia.  The osteotome was used to fine cut the anterior tibial cortex to facilitate placement of the Synthes anterior ankle fusion plate.  I did not encounter any purulence in this area, but nonetheless prior to placing the plate and completing osteotomy did perform another aggressive curettage and irrigation.  The heel incision was remade the antibiotic spacer, grasped and removed.  The canal was irrigated thoroughly.  The tibiotalar fusion space was debrided and irrigated and then large scale graft placed through the intramedullary canal from below and packed into the distal tibial metaphysis and the talus and calcaneus.  Furthermore additional bone was placed to the anterior window.  The large infuse sponge was used as well.  Packing graft then infused and more graft infused and more graft from posterior to anterior using the circle technique.  Lastly the plate was affixed, standard screw placed in the distal talus while holding the ankle in appropriate position.  An additional screw in the 3rd hole in compression and then 2 more lag screws using 6.5 of cancellous with outstanding over drilling the near cortex and obtaining outstanding purchase and compression in the calcaneus and talus.  Final images showed appropriate hardware placement, plate placement, fusion position, and excellent bone fill. Wound was irrigated thoroughly, closed in standard layered fashion.  A total  of 35 mL of cancellous graft were used in addition to the large infuse sponge.  Sterile gently compressive dressing was then applied with a posterior stirrup splint, well padded, and the patient taken to the PACU in stable condition.  PROGNOSIS:  Crystal Frank will be nonweightbearing on the left lower extremity for the next 6 weeks with graduated weightbearing thereafter and protected.  For the ensuing 6 weeks, she will be at increased risk for failure of fusion given her previous noncompliance with  smoking restrictions and previous failure.  However, biologically she appear to be in excellent condition with no evidence of infection.  Good vascular flow prior to elevation of the tourniquet and excellent mechanics of her alignment and fixation.     Doralee Albino. Carola Frost, M.D.     MHH/MEDQ  D:  10/23/2012  T:  10/24/2012  Job:  [id number]

## 2012-10-24 NOTE — Progress Notes (Signed)
Physical Therapy Evaluation Patient Details Name: Crystal Frank MRN: [id number] DOB: [birth year] Today's Date: 10/24/2012 Time: 4010-2725 PT Time Calculation (min): 22 min  PT Assessment / Plan / Recommendation Clinical Impression  Pt is 46 yo female s/p left ankle arthrodesis whose mobility status has not changed significantly since before surgery.  She is independent with mobility and understands NWB status. She reports that her w/c is broken and has no leg rests, recommend new manual w/c, std size, with left elevating leg rest. No further PT needs at this time. PT signing off.    PT Assessment  Patent does not need any further PT services    Follow Up Recommendations  No PT follow up    Does the patient have the potential to tolerate intense rehabilitation      Barriers to Discharge        Equipment Recommendations  Wheelchair (measurements) (standard with elevating left leg rest)    Recommendations for Other Services     Frequency      Precautions / Restrictions Precautions Precautions: Fall Restrictions Weight Bearing Restrictions: Yes LLE Weight Bearing: Non weight bearing Other Position/Activity Restrictions: elevate LLE   Pertinent Vitals/Pain 6/10 left ankle pain, premedicated       Mobility  Bed Mobility Bed Mobility: Supine to Sit;Sitting - Scoot to Edge of Bed Supine to Sit: 7: Independent Sitting - Scoot to Edge of Bed: 7: Independent Transfers Transfers: Sit to Stand;Stand to Sit Sit to Stand: 6: Modified independent (Device/Increase time);From bed Stand to Sit: 6: Modified independent (Device/Increase time);To chair/3-in-1 Details for Transfer Assistance: pt able to perform sit to stand without physical assist, vc's for taking her time but no unsafe mvmvts made Ambulation/Gait Ambulation/Gait Assistance: 6: Modified independent (Device/Increase time) Ambulation Distance (Feet): 5 Feet Assistive device: Rolling walker Ambulation/Gait Assistance  Details: pt able to use UE's effectively to hop on RLE Stairs: No Wheelchair Mobility Wheelchair Mobility: No    Shoulder Instructions     Exercises General Exercises - Lower Extremity Ankle Circles/Pumps: AROM;Right;10 reps Quad Sets: Left;AROM;10 reps Straight Leg Raises: AROM;10 reps;Left   PT Diagnosis:    PT Problem List:   PT Treatment Interventions:     PT Goals    Visit Information  Last PT Received On: 10/24/12 Assistance Needed: +1    Subjective Data  Subjective: I've done all of this already Patient Stated Goal: return home   Prior Functioning  Home Living Lives With: Significant other Available Help at Discharge: Available 24 hours/day Type of Home: House Home Access: Stairs to enter [employer] of Steps: 4 Entrance Stairs-Rails: Can reach both Home Layout: One level Bathroom Shower/Tub: Engineer, manufacturing systems: Standard Bathroom Accessibility: Yes How Accessible: Accessible via walker;Accessible via wheelchair Home Adaptive Equipment: Bedside commode/3-in-1;Walker - rolling;Wheelchair - manual Additional Comments: pt reports that her w/c is her oldest piece of equipment and both arms are broken and the leg rests are missing. Recommend new w/c with elevating left leg rest Prior Function Level of Independence: Independent with assistive device(s) Able to Take Stairs?: Yes Driving: No Vocation: Workers comp Comments: pt hops up stairs using Best boy: No difficulties Dominant Hand: Right    Cognition  Overall Cognitive Status: Appears within functional limits for tasks assessed/performed Arousal/Alertness: Awake/alert Orientation Level: Appears intact for tasks assessed Behavior During Session: Other (comment) (slightly impulsive) Cognition - Other Comments: pt slightly impulsive with speech and mobility, reports that she knows she tends to rush and knows when to be careful  Extremity/Trunk Assessment  Right Upper Extremity Assessment RUE ROM/Strength/Tone: Deficits Right Lower Extremity Assessment RLE ROM/Strength/Tone: Within functional levels RLE Sensation: WFL - Light Touch;WFL - Proprioception RLE Coordination: WFL - gross motor Left Lower Extremity Assessment LLE ROM/Strength/Tone: Deficits;Due to precautions LLE ROM/Strength/Tone Deficits: hip and knee appear WFL, able to wiggle toes and with good color  left leg and toes LLE Sensation: WFL - Light Touch;WFL - Proprioception (hip and knee) LLE Coordination: WFL - gross motor Trunk Assessment Trunk Assessment: Normal   Balance Balance Balance Assessed: Yes Dynamic Standing Balance Dynamic Standing - Balance Support: Bilateral upper extremity supported;During functional activity Dynamic Standing - Level of Assistance: 6: Modified independent (Device/Increase time)  End of Session PT - End of Session Activity Tolerance: Patient tolerated treatment well Patient left: in chair;with call bell/phone within reach Nurse Communication: Mobility status  GP   Crystal Frank, PT  Acute Rehab Services  [phone]   Crystal Frank 10/24/2012, 3:41 PM

## 2012-10-25 ENCOUNTER — Encounter (HOSPITAL_COMMUNITY): Payer: Self-pay | Admitting: Orthopedic Surgery

## 2012-10-25 LAB — CBC
HCT: 32.4 % — ABNORMAL LOW (ref 36.0–46.0)
Hemoglobin: 10.9 g/dL — ABNORMAL LOW (ref 12.0–15.0)
MCH: 27.9 pg (ref 26.0–34.0)
MCHC: 33.6 g/dL (ref 30.0–36.0)
MCV: 83.1 fL (ref 78.0–100.0)
Platelets: 140 10*3/uL — ABNORMAL LOW (ref 150–400)
RBC: 3.9 MIL/uL (ref 3.87–5.11)
RDW: 16.3 % — ABNORMAL HIGH (ref 11.5–15.5)
WBC: 8.4 10*3/uL (ref 4.0–10.5)

## 2012-10-25 LAB — BASIC METABOLIC PANEL
BUN: 6 mg/dL (ref 6–23)
CO2: 25 mEq/L (ref 19–32)
Calcium: 8.4 mg/dL (ref 8.4–10.5)
Chloride: 105 mEq/L (ref 96–112)
Creatinine, Ser: 0.77 mg/dL (ref 0.50–1.10)
GFR calc Af Amer: >90 mL/min (ref >90)
GFR calc non Af Amer: >90 mL/min (ref >90)
Glucose, Bld: 120 mg/dL — ABNORMAL HIGH (ref 70–99)
Potassium: 3.8 mEq/L (ref 3.5–5.1)
Sodium: 138 mEq/L (ref 135–145)

## 2012-10-25 LAB — PROTIME-INR
INR: 1.73 — ABNORMAL HIGH (ref 0.00–1.49)
Prothrombin Time: 19.7 seconds — ABNORMAL HIGH (ref 11.6–15.2)

## 2012-10-25 MED ORDER — WARFARIN SODIUM 2.5 MG PO TABS
12.5000 mg | ORAL_TABLET | Freq: Once | ORAL | Status: DC
  Filled 2012-10-25: qty 1

## 2012-10-25 MED ORDER — METHOCARBAMOL 500 MG PO TABS
500.0000 mg | ORAL_TABLET | Freq: Four times a day (QID) | ORAL | Status: DC
Start: 2012-10-25 — End: 2013-08-23

## 2012-10-25 MED ORDER — WARFARIN SODIUM 5 MG PO TABS: 10.0000 mg | ORAL_TABLET | Freq: Every day | ORAL | Status: DC

## 2012-10-25 MED ORDER — OXYCODONE HCL 10 MG PO TABS: 10.0000 mg | ORAL_TABLET | ORAL | Status: DC | PRN

## 2012-10-25 MED ORDER — OXYCODONE HCL ER 40 MG PO T12A: 40.0000 mg | EXTENDED_RELEASE_TABLET | Freq: Two times a day (BID) | ORAL | Status: DC

## 2012-10-25 MED ORDER — ENOXAPARIN SODIUM 100 MG/ML ~~LOC~~ SOLN: 100.0000 mg | Freq: Two times a day (BID) | SUBCUTANEOUS | Status: DC

## 2012-10-25 NOTE — Progress Notes (Signed)
I saw and examined the patient with Mr. Paul, communicating the findings and plan noted above.  Krysia Zahradnik, MD [facility] [phone] (p)  

## 2012-10-25 NOTE — Progress Notes (Signed)
POD 2 Feeling well and eager for d/c LLE flex/ ext great and lesser toes     no change in exam  PLAN:  D/c to home today  New med scripts written  Myrene Galas, MD [facility] [phone] (p)

## 2012-10-25 NOTE — Progress Notes (Signed)
ANTICOAGULATION CONSULT NOTE - Follow up Consult  Pharmacy Consult for Lovenox/Coumadin Indication:  h/o clotting disorder with PE 2008. DVT px  No Known Allergies  Patient Measurements: Height: 5' 10.87" (180 cm) Weight: 223 lb 8.7 oz (101.4 kg) IBW/kg (Calculated) : 70.49   Vital Signs: Temp: 98.5 F (36.9 C) (12/05 0600) BP: 120/61 mmHg (12/05 0600) Pulse Rate: 101  (12/05 0600)  Labs:  Basename 10/25/12 0910 10/24/12 0533 10/22/12 1154  HGB 10.9* 11.5* --  HCT 32.4* 34.7* 43.4  PLT 140* 150 221  APTT -- -- 40*  LABPROT 19.7* 15.7* 13.6  INR 1.73* 1.28 1.05  HEPARINUNFRC -- -- --  CREATININE 0.77 0.91 0.81  CKTOTAL -- -- --  CKMB -- -- --  TROPONINI -- -- --    Estimated Creatinine Clearance: 117.4 ml/min (by C-G formula based on Cr of 0.77).   Medical History: Past Medical History  Diagnosis Date  . Pulmonary embolism 2008    seen at [facility]--right lung  . Lupus   . Pleurisy   . 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 Arthritis   . Osteomyelitis     Medications:  Prescriptions prior to admission  Medication Sig Dispense Refill  . enoxaparin (LOVENOX) 100 MG/ML injection Inject 1 mL (100 mg total) into the skin every 12 (twelve) hours.  28 Syringe  1  . pregabalin (LYRICA) 150 MG capsule Take 150 mg by mouth 2 (two) times daily.      . [DISCONTINUED] doxycycline (VIBRAMYCIN) 50 MG capsule Take 2 capsules (100 mg total) by mouth 2 (two) times daily.  60 capsule  1  . [DISCONTINUED] levofloxacin (LEVAQUIN) 750 MG tablet Take 1 tablet (750 mg total) by mouth daily.  30 tablet  1  . acetaminophen (TYLENOL) 500 MG tablet Take 500-1,000 mg by mouth every 6 (six) hours as needed. For pain or fever      . docusate sodium (COLACE) 100 MG capsule Take 100 mg by mouth 2 (two) times daily as needed. For constipation      . [DISCONTINUED] oxyCODONE (OXYCONTIN) 40 MG 12 hr tablet Take 40 mg by mouth every 12 (twelve) hours.      .  [DISCONTINUED] Oxycodone HCl 10 MG TABS Take 10 mg by mouth every 3 (three) hours as needed. For breakthrough pain; not to exceed 5 tablets per day      . [DISCONTINUED] warfarin (COUMADIN) 5 MG tablet Take 10-12.5 mg by mouth daily. Monday and Friday 12.5mg ;  all other days 10 mg        Assessment:  46 y/o female patient pod#2 s/p left ankle arthrodesis with h/o PE requiring anticoagulation for dvt px. INR subtherapeutic but trending up with large increased today. Will need to decrease dose. Patient receiving full dose lovenox to bridge until INR>2. Noted h/h and plt decrease, no bleeding reported.    Goal of Therapy:  INR 2-3 Monitor platelets by anticoagulation protocol: Yes   Plan:  Coumadin 12.5mg  today and continue lovenox until INR>2.0. F/u daily protime.  Verlene Mayer, PharmD, BCPS Pager [phone] 10/25/2012,11:31 AM

## 2012-10-25 NOTE — Progress Notes (Signed)
Pt discharged to home accompanied by family. Discharge instructions and rx given and explained and pt stated understanding. Pt left unit in a stable condition via wheelchair.

## 2013-02-11 NOTE — Discharge Summary (Signed)
[facility] ([facility])  Patient ID: Crystal Frank MRN: [id number] DOB/AGE: November 09, 1966 47 y.o.  Admit date: 10/23/2012 Discharge date: 10/25/2013 Admission Diagnoses:  Nonunion L tibiotalar fusion Clotting disorder Chronic pain  H/o osteomyelitis L tibia Lupus  Discharge Diagnoses:  Active Problems:   Nonunion of L tibiotalar fusion   Clotting disorder   Osteomyelitis, L tibia   Lupus   Chronic pain   Procedures Performed: 10/23/2013- Dr. Carola Frost  1. Revision arthrodesis of the left ankle.   2. Removal of antibiotic spacer.  Discharged Condition: good  Hospital Course:   Pt well known to OTS for multiple B Lex surgeries.  Pt has continued to use nicotine products and has persistent nonunion of L tibiotalar fusion.  Attempted fusion revision 4 weeks ago but there was some evidence of osteomyelitis. As such an antibiotic spacer was placed and the pt returned 4 weeks later for definitive fusion.  Pt was brought to the OR on 10/23/2013 and underwent the procedure noted above.  She tolerated very well.  Hospital stay was uncomplicated.  She was in the hospital 2 post op days and discharged in stable condition on POD 2.  Pain was controlled on PO meds, pt was tolerating diet and voiding.   Consults: None  Significant Diagnostic Studies: none  Treatments: IV hydration, antibiotics: vancomycin, analgesia: Dilaudid and Oxy IR, percocet , anticoagulation: LMW heparin, therapies: PT and RN, SW and surgery: as above  Discharge Exam:  POD 2 Feeling well and eager for d/c LLE flex/ ext great and lesser toes              no change in exam  PLAN:             D/c to home today             New med scripts written  Crystal Galas, MD [facility], [facility] [phone] (p)    Disposition: 01-Home or Self Care  Discharge Orders   Future Orders Complete By Expires     Call MD / Call 911  As directed     Comments:      If you experience  chest pain or shortness of breath, CALL 911 and be transported to the hospital emergency room.  If you develope a fever above 101 F, pus (white drainage) or increased drainage or redness at the wound, or calf pain, call your surgeon's office.    Constipation Prevention  As directed     Comments:      Drink plenty of fluids.  Prune juice may be helpful.  You may use a stool softener, such as Colace (over the counter) 100 mg twice a day.  Use MiraLax (over the counter) for constipation as needed.    Diet general  As directed     Discharge instructions  As directed     Comments:      [facility] Discharge Instructions,   General Discharge Instructions  WEIGHT BEARING STATUS:Nonweight bearing L leg  RANGE OF MOTION/ACTIVITY: Activity as tolerated while maintaining Weight bearing restrictions. Do not remove splint  Meds: we will only provide medication for the next six weeks.  It is your responsibility to establish a new pain management referral or PCP  Diet: as you were eating previously.  Can use over the counter stool softeners and bowel preparations, such as Miralax, to help with bowel movements.  Narcotics can be constipating.  Be sure to drink plenty of fluids  STOP SMOKING OR USING NICOTINE PRODUCTS!!!!  As discussed nicotine severely impairs your body's ability to heal surgical and traumatic wounds but also impairs bone healing.  Wounds and bone heal by forming microscopic blood vessels (angiogenesis) and nicotine is a vasoconstrictor (essentially, shrinks blood vessels).  Therefore, if vasoconstriction occurs to these microscopic blood vessels they essentially disappear and are unable to deliver necessary nutrients to the healing tissue.  This is one modifiable factor that you can do to dramatically increase your chances of healing your injury.    (This means no smoking, no nicotine gum, patches, etc)  DO NOT USE NONSTEROIDAL ANTI-INFLAMMATORY DRUGS (NSAID'S)  Using  products such as Advil (ibuprofen), Aleve (naproxen), Motrin (ibuprofen) for additional pain control during fracture healing can delay and/or prevent the healing response.  If you would like to take over the counter (OTC) medication, Tylenol (acetaminophen) is ok.  However, some narcotic medications that are given for pain control contain acetaminophen as well. Therefore, you should not exceed more than 4000 mg of tylenol in a day if you do not have liver disease.  Also note that there are may OTC medicines, such as cold medicines and allergy medicines that my contain tylenol as well.  If you have any questions about medications and/or interactions please ask your doctor/PA or your pharmacist.   PAIN MEDICATION USE AND EXPECTATIONS  You have likely been given narcotic medications to help control your pain.  After a traumatic event that results in an fracture (broken bone) with or without surgery, it is ok to use narcotic pain medications to help control one's pain.  We understand that everyone responds to pain differently and each individual patient will be evaluated on a regular basis for the continued need for narcotic medications. Ideally, narcotic medication use should last no more than 6-8 weeks (coinciding with fracture healing).   As a patient it is your responsibility as well to monitor narcotic medication use and report the amount and frequency you use these medications when you come to your office visit.   We would also advise that if you are using narcotic medications, you should take a dose prior to therapy to maximize you participation.  IF YOU ARE ON NARCOTIC MEDICATIONS IT IS NOT PERMISSIBLE TO OPERATE A MOTOR VEHICLE (MOTORCYCLE/CAR/TRUCK/MOPED) OR HEAVY MACHINERY DO NOT MIX NARCOTICS WITH OTHER CNS (CENTRAL NERVOUS SYSTEM) DEPRESSANTS SUCH AS ALCOHOL       ICE AND ELEVATE INJURED/OPERATIVE EXTREMITY  Using ice and elevating the injured extremity above your heart can help with swelling  and pain control.  Icing in a pulsatile fashion, such as 20 minutes on and 20 minutes off, can be followed.    Do not place ice directly on skin. Make sure there is a barrier between to skin and the ice pack.    Using frozen items such as frozen peas works well as the conform nicely to the are that needs to be iced.  USE AN ACE WRAP OR TED HOSE FOR SWELLING CONTROL  In addition to icing and elevation, Ace wraps or TED hose are used to help limit and resolve swelling.  It is recommended to use Ace wraps or TED hose until you are informed to stop.    When using Ace Wraps start the wrapping distally (farthest away from the body) and wrap proximally (closer to the body)   Example: If you had surgery on your leg or thing and you do not have a splint on, start the ace wrap at  the toes and work your way up to the thigh        If you had surgery on your upper extremity and do not have a splint on, start the ace wrap at your fingers and work your way up to the upper arm  IF YOU ARE IN A SPLINT OR CAST DO NOT REMOVE IT FOR ANY REASON   If your splint gets wet for any reason please contact the office immediately. You may shower in your splint or cast as long as you keep it dry.  This can be done by wrapping in a cast cover or garbage back (or similar)  Do Not stick any thing down your splint or cast such as pencils, money, or hangers to try and scratch yourself with.  If you feel itchy take benadryl as prescribed on the bottle for itching  IF YOU ARE IN A CAM BOOT (BLACK BOOT)  You may remove boot periodically. Perform daily dressing changes as noted below.  Wash the liner of the boot regularly and wear a sock when wearing the boot. It is recommended that you sleep in the boot until told otherwise  CALL THE OFFICE WITH ANY QUESTIONS OR CONCERTS: [phone]     Discharge Pin Site Instructions  Dress pins daily with Kerlix roll starting on POD 2. Wrap the Kerlix so that it tamps the skin down around the  pin-skin interface to prevent/limit motion of the skin relative to the pin.  (Pin-skin motion is the primary cause of pain and infection related to external fixator pin sites).  Remove any crust or coagulum that may obstruct drainage with a saline moistened gauze or soap and water.  After POD 3, if there is no discernable drainage on the pin site dressing, the interval for change can by increased to every other day.  You may shower with the fixator, cleaning all pin sites gently with soap and water.  If you have a surgical wound this needs to be completely dry and without drainage before showering.  The extremity can be lifted by the fixator to facilitate wound care and transfers.  Notify the office/Doctor if you experience increasing drainage, redness, or pain from a pin site, or if you notice purulent (thick, snot-like) drainage.  Discharge Wound Care Instructions  Do NOT apply any ointments, solutions or lotions to pin sites or surgical wounds.  These prevent needed drainage and even though solutions like hydrogen peroxide kill bacteria, they also damage cells lining the pin sites that help fight infection.  Applying lotions or ointments can keep the wounds moist and can cause them to breakdown and open up as well. This can increase the risk for infection. When in doubt call the office.  Surgical incisions should be dressed daily.  If any drainage is noted, use one layer of adaptic, then gauze, Kerlix, and an ace wrap.  Once the incision is completely dry and without drainage, it may be left open to air out.  Showering may begin 36-48 hours later.  Cleaning gently with soap and water.  Traumatic wounds should be dressed daily as well.    One layer of adaptic, gauze, Kerlix, then ace wrap.  The adaptic can be discontinued once the draining has ceased    If you have a wet to dry dressing: wet the gauze with saline the squeeze as much saline out so the gauze is moist (not soaking wet), place  moistened gauze over wound, then place a dry gauze over the moist  one, followed by Kerlix wrap, then ace wrap.    Increase activity slowly as tolerated  As directed     Non weight bearing  As directed         Medication List    STOP taking these medications       doxycycline 50 MG capsule  Commonly known as:  VIBRAMYCIN     levofloxacin 750 MG tablet  Commonly known as:  LEVAQUIN     OXYCONTIN 40 MG 12 hr tablet  Generic drug:  oxyCODONE      TAKE these medications       acetaminophen 500 MG tablet  Commonly known as:  TYLENOL  Take 500-1,000 mg by mouth every 6 (six) hours as needed. For pain or fever     docusate sodium 100 MG capsule  Commonly known as:  COLACE  Take 100 mg by mouth 2 (two) times daily as needed. For constipation     enoxaparin 100 MG/ML injection  Commonly known as:  LOVENOX  Inject 1 mL (100 mg total) into the skin every 12 (twelve) hours.     enoxaparin 100 MG/ML injection  Commonly known as:  LOVENOX  Inject 1 mL (100 mg total) into the skin every 12 (twelve) hours.     methocarbamol 500 MG tablet  Commonly known as:  ROBAXIN  Take 1-2 tablets (500-1,000 mg total) by mouth 4 (four) times daily.     Oxycodone HCl 10 MG Tabs  Take 1 tablet (10 mg total) by mouth every 4 (four) hours as needed.     OxyCODONE 40 mg T12a  Commonly known as:  OXYCONTIN  Take 1 tablet (40 mg total) by mouth every 12 (twelve) hours.     pregabalin 150 MG capsule  Commonly known as:  LYRICA  Take 150 mg by mouth 2 (two) times daily.     warfarin 5 MG tablet  Commonly known as:  COUMADIN  Take 2-2.5 tablets (10-12.5 mg total) by mouth daily. Monday and Friday 12.5mg ;  all other days 10 mg           Follow-up Information   Follow up with HANDY,MICHAEL H, MD. Schedule an appointment as soon as possible for a visit in 14 days.   Contact information:   [address] [place] [address] [phone]       Discharge  Instructions and Plan:  Pt will be NWB x 8 weeks Will see back in 2-3 weeks for splint removal and suture removal. Will get xrays at that time  Signed:  Mearl Latin, PA-C [facility] [phone] (P) 02/11/2013, 2:29 PM

## 2013-02-15 ENCOUNTER — Encounter: Payer: Self-pay | Admitting: Pharmacist

## 2013-02-27 ENCOUNTER — Ambulatory Visit (INDEPENDENT_AMBULATORY_CARE_PROVIDER_SITE_OTHER): Payer: Medicare Other | Admitting: Pharmacist

## 2013-02-27 DIAGNOSIS — I2699 Other pulmonary embolism without acute cor pulmonale: Secondary | ICD-10-CM

## 2013-02-27 DIAGNOSIS — Z981 Arthrodesis status: Secondary | ICD-10-CM | POA: Diagnosis not present

## 2013-02-27 DIAGNOSIS — IMO0002 Reserved for concepts with insufficient information to code with codable children: Secondary | ICD-10-CM | POA: Diagnosis not present

## 2013-02-27 DIAGNOSIS — Z7901 Long term (current) use of anticoagulants: Secondary | ICD-10-CM

## 2013-02-27 LAB — POCT INR: INR: 1.6

## 2013-03-06 ENCOUNTER — Ambulatory Visit (INDEPENDENT_AMBULATORY_CARE_PROVIDER_SITE_OTHER): Payer: Medicare Other | Admitting: *Deleted

## 2013-03-06 DIAGNOSIS — Z7901 Long term (current) use of anticoagulants: Secondary | ICD-10-CM | POA: Diagnosis not present

## 2013-03-06 DIAGNOSIS — I2699 Other pulmonary embolism without acute cor pulmonale: Secondary | ICD-10-CM

## 2013-03-06 LAB — POCT INR: INR: 3.8

## 2013-05-29 DIAGNOSIS — Z981 Arthrodesis status: Secondary | ICD-10-CM | POA: Diagnosis not present

## 2013-05-29 DIAGNOSIS — M12579 Traumatic arthropathy, unspecified ankle and foot: Secondary | ICD-10-CM | POA: Diagnosis not present

## 2013-05-29 DIAGNOSIS — IMO0002 Reserved for concepts with insufficient information to code with codable children: Secondary | ICD-10-CM | POA: Diagnosis not present

## 2013-05-29 DIAGNOSIS — IMO0001 Reserved for inherently not codable concepts without codable children: Secondary | ICD-10-CM | POA: Diagnosis not present

## 2013-07-26 ENCOUNTER — Ambulatory Visit (INDEPENDENT_AMBULATORY_CARE_PROVIDER_SITE_OTHER): Payer: Medicare Other | Admitting: *Deleted

## 2013-07-26 DIAGNOSIS — I2699 Other pulmonary embolism without acute cor pulmonale: Secondary | ICD-10-CM | POA: Diagnosis not present

## 2013-07-26 DIAGNOSIS — Z7901 Long term (current) use of anticoagulants: Secondary | ICD-10-CM | POA: Diagnosis not present

## 2013-07-26 LAB — POCT INR: INR: 2.7

## 2013-07-26 MED ORDER — WARFARIN SODIUM 5 MG PO TABS: 5.0000 mg | ORAL_TABLET | ORAL | Status: DC

## 2013-07-31 ENCOUNTER — Other Ambulatory Visit: Payer: Self-pay | Admitting: *Deleted

## 2013-07-31 MED ORDER — WARFARIN SODIUM 5 MG PO TABS: 5.0000 mg | ORAL_TABLET | ORAL | Status: DC

## 2013-08-05 DIAGNOSIS — M47817 Spondylosis without myelopathy or radiculopathy, lumbosacral region: Secondary | ICD-10-CM | POA: Diagnosis not present

## 2013-08-05 DIAGNOSIS — G5600 Carpal tunnel syndrome, unspecified upper limb: Secondary | ICD-10-CM | POA: Diagnosis not present

## 2013-08-05 DIAGNOSIS — Z79899 Other long term (current) drug therapy: Secondary | ICD-10-CM | POA: Diagnosis not present

## 2013-08-05 DIAGNOSIS — M25579 Pain in unspecified ankle and joints of unspecified foot: Secondary | ICD-10-CM | POA: Diagnosis not present

## 2013-08-14 DIAGNOSIS — M12579 Traumatic arthropathy, unspecified ankle and foot: Secondary | ICD-10-CM | POA: Diagnosis not present

## 2013-08-14 DIAGNOSIS — Z981 Arthrodesis status: Secondary | ICD-10-CM | POA: Diagnosis not present

## 2013-08-14 DIAGNOSIS — IMO0002 Reserved for concepts with insufficient information to code with codable children: Secondary | ICD-10-CM | POA: Diagnosis not present

## 2013-08-23 ENCOUNTER — Emergency Department (HOSPITAL_COMMUNITY)
Admission: EM | Admit: 2013-08-23 | Discharge: 2013-08-23 | Disposition: A | Discharge status: Home / Self Care | Payer: Medicare Other | Attending: Emergency Medicine | Admitting: Emergency Medicine

## 2013-08-23 ENCOUNTER — Emergency Department (HOSPITAL_COMMUNITY): Payer: Medicare Other

## 2013-08-23 ENCOUNTER — Encounter (HOSPITAL_COMMUNITY): Payer: Self-pay | Admitting: Emergency Medicine

## 2013-08-23 DIAGNOSIS — Z8709 Personal history of other diseases of the respiratory system: Secondary | ICD-10-CM | POA: Insufficient documentation

## 2013-08-23 DIAGNOSIS — Z0000 Encounter for general adult medical examination without abnormal findings: Secondary | ICD-10-CM | POA: Diagnosis not present

## 2013-08-23 DIAGNOSIS — F489 Nonpsychotic mental disorder, unspecified: Secondary | ICD-10-CM | POA: Diagnosis not present

## 2013-08-23 DIAGNOSIS — S0990XA Unspecified injury of head, initial encounter: Secondary | ICD-10-CM | POA: Diagnosis not present

## 2013-08-23 DIAGNOSIS — F10929 Alcohol use, unspecified with intoxication, unspecified: Secondary | ICD-10-CM

## 2013-08-23 DIAGNOSIS — Z0389 Encounter for observation for other suspected diseases and conditions ruled out: Secondary | ICD-10-CM | POA: Diagnosis not present

## 2013-08-23 DIAGNOSIS — F29 Unspecified psychosis not due to a substance or known physiological condition: Secondary | ICD-10-CM | POA: Diagnosis not present

## 2013-08-23 DIAGNOSIS — R4789 Other speech disturbances: Secondary | ICD-10-CM | POA: Insufficient documentation

## 2013-08-23 DIAGNOSIS — M542 Cervicalgia: Secondary | ICD-10-CM | POA: Diagnosis not present

## 2013-08-23 DIAGNOSIS — R51 Headache: Secondary | ICD-10-CM | POA: Diagnosis not present

## 2013-08-23 DIAGNOSIS — Z7901 Long term (current) use of anticoagulants: Secondary | ICD-10-CM | POA: Insufficient documentation

## 2013-08-23 DIAGNOSIS — S0993XA Unspecified injury of face, initial encounter: Secondary | ICD-10-CM | POA: Diagnosis not present

## 2013-08-23 DIAGNOSIS — Z79899 Other long term (current) drug therapy: Secondary | ICD-10-CM | POA: Insufficient documentation

## 2013-08-23 DIAGNOSIS — T50901A Poisoning by unspecified drugs, medicaments and biological substances, accidental (unintentional), initial encounter: Secondary | ICD-10-CM | POA: Diagnosis not present

## 2013-08-23 DIAGNOSIS — M129 Arthropathy, unspecified: Secondary | ICD-10-CM | POA: Insufficient documentation

## 2013-08-23 DIAGNOSIS — J984 Other disorders of lung: Secondary | ICD-10-CM | POA: Diagnosis not present

## 2013-08-23 DIAGNOSIS — Z87891 Personal history of nicotine dependence: Secondary | ICD-10-CM | POA: Insufficient documentation

## 2013-08-23 DIAGNOSIS — F1010 Alcohol abuse, uncomplicated: Secondary | ICD-10-CM | POA: Insufficient documentation

## 2013-08-23 DIAGNOSIS — Z86711 Personal history of pulmonary embolism: Secondary | ICD-10-CM | POA: Insufficient documentation

## 2013-08-23 LAB — CBC WITH DIFFERENTIAL/PLATELET
Basophils Absolute: 0.1 10*3/uL (ref 0.0–0.1)
Basophils Relative: 1 % (ref 0–1)
Eosinophils Absolute: 0.1 10*3/uL (ref 0.0–0.7)
Eosinophils Relative: 1 % (ref 0–5)
HCT: 39.1 % (ref 36.0–46.0)
Hemoglobin: 13.5 g/dL (ref 12.0–15.0)
Lymphocytes Relative: 31 % (ref 12–46)
Lymphs Abs: 3.6 10*3/uL (ref 0.7–4.0)
MCH: 27.3 pg (ref 26.0–34.0)
MCHC: 34.5 g/dL (ref 30.0–36.0)
MCV: 79 fL (ref 78.0–100.0)
Monocytes Absolute: 0.9 10*3/uL (ref 0.1–1.0)
Monocytes Relative: 8 % (ref 3–12)
Neutro Abs: 6.8 10*3/uL (ref 1.7–7.7)
Neutrophils Relative %: 60 % (ref 43–77)
Platelets: 314 10*3/uL (ref 150–400)
RBC: 4.95 MIL/uL (ref 3.87–5.11)
RDW: 16.3 % — ABNORMAL HIGH (ref 11.5–15.5)
WBC: 11.5 10*3/uL — ABNORMAL HIGH (ref 4.0–10.5)

## 2013-08-23 LAB — COMPREHENSIVE METABOLIC PANEL
ALT: 11 U/L (ref 0–35)
AST: 13 U/L (ref 0–37)
Albumin: 3.8 g/dL (ref 3.5–5.2)
Alkaline Phosphatase: 63 U/L (ref 39–117)
BUN: 6 mg/dL (ref 6–23)
CO2: 25 mEq/L (ref 19–32)
Calcium: 8.8 mg/dL (ref 8.4–10.5)
Chloride: 108 mEq/L (ref 96–112)
Creatinine, Ser: 0.86 mg/dL (ref 0.50–1.10)
GFR calc Af Amer: >90 mL/min (ref >90)
GFR calc non Af Amer: 80 mL/min — ABNORMAL LOW (ref >90)
Glucose, Bld: 92 mg/dL (ref 70–99)
Potassium: 3.6 mEq/L (ref 3.5–5.1)
Sodium: 144 mEq/L (ref 135–145)
Total Bilirubin: 0.6 mg/dL (ref 0.3–1.2)
Total Protein: 6.8 g/dL (ref 6.0–8.3)

## 2013-08-23 LAB — RAPID URINE DRUG SCREEN, HOSP PERFORMED
Amphetamines: NOT DETECTED
Barbiturates: NOT DETECTED
Benzodiazepines: NOT DETECTED
Cocaine: POSITIVE — AB
Opiates: NOT DETECTED
Tetrahydrocannabinol: NOT DETECTED

## 2013-08-23 LAB — PROTIME-INR
INR: 2.06 — ABNORMAL HIGH (ref 0.00–1.49)
Prothrombin Time: 22.6 seconds — ABNORMAL HIGH (ref 11.6–15.2)

## 2013-08-23 LAB — ACETAMINOPHEN LEVEL: Acetaminophen (Tylenol), Serum: <15 ug/mL (ref 10–30)

## 2013-08-23 LAB — SALICYLATE LEVEL: Salicylate Lvl: <2 mg/dL — ABNORMAL LOW (ref 2.8–20.0)

## 2013-08-23 LAB — ETHANOL: Alcohol, Ethyl (B): 160 mg/dL — ABNORMAL HIGH (ref 0–11)

## 2013-08-23 MED ORDER — IBUPROFEN 800 MG PO TABS
800.0000 mg | ORAL_TABLET | Freq: Once | ORAL | Status: AC
  Administered 2013-08-23: 800 mg via ORAL
  Filled 2013-08-23: qty 1

## 2013-08-23 NOTE — ED Notes (Signed)
Bed: ZO10 Expected date:  Expected time:  Means of arrival:  Comments: EMS/47 yo heavy ETOH/suicidal
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
## 2013-08-23 NOTE — ED Provider Notes (Addendum)
Date: 08/23/2013  Rate: 82  Rhythm: normal sinus rhythm  QRS Axis: normal  Intervals: normal  ST/T Wave abnormalities: normal  Conduction Disutrbances: none  Narrative Interpretation: unremarkable      Gwyneth Sprout, MD 08/23/13 1610  Gwyneth Sprout, MD 08/23/13 [phone]

## 2013-08-23 NOTE — ED Notes (Signed)
Pt. Belongings are locked in locker #38. ( black pants, black slide in shoes, pink bra, sun glasses, blue bag, flowered panties, yellow gold color purse)  nurse was notified.

## 2013-08-23 NOTE — ED Notes (Signed)
Pt. Attempted to give urine but unsuccessful. 

## 2013-08-23 NOTE — ED Notes (Signed)
Currently, identified medications in bottle are: Warfarin, Oxycodone, and Klonopin.

## 2013-08-23 NOTE — ED Notes (Signed)
Patient transported to X-ray 
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
## 2013-08-23 NOTE — ED Notes (Signed)
EKG old and new given to EDP, Plunkett, MD. 
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
## 2013-08-26 NOTE — ED Provider Notes (Signed)
Medical screening examination/treatment/procedure(s) were performed by non-physician practitioner and as supervising physician I was immediately available for consultation/collaboration.   Gwyneth Sprout, MD 08/26/13 2142

## 2013-09-02 ENCOUNTER — Ambulatory Visit (INDEPENDENT_AMBULATORY_CARE_PROVIDER_SITE_OTHER): Payer: Medicare Other | Admitting: *Deleted

## 2013-09-02 DIAGNOSIS — I2699 Other pulmonary embolism without acute cor pulmonale: Secondary | ICD-10-CM | POA: Diagnosis not present

## 2013-09-02 DIAGNOSIS — Z7901 Long term (current) use of anticoagulants: Secondary | ICD-10-CM

## 2013-09-02 LAB — POCT INR: INR: 1.9

## 2013-09-02 MED ORDER — WARFARIN SODIUM 5 MG PO TABS: 5.0000 mg | ORAL_TABLET | ORAL | Status: DC

## 2013-09-05 DIAGNOSIS — G894 Chronic pain syndrome: Secondary | ICD-10-CM | POA: Diagnosis not present

## 2013-09-05 DIAGNOSIS — F329 Major depressive disorder, single episode, unspecified: Secondary | ICD-10-CM | POA: Diagnosis not present

## 2013-09-05 DIAGNOSIS — M47817 Spondylosis without myelopathy or radiculopathy, lumbosacral region: Secondary | ICD-10-CM | POA: Diagnosis not present

## 2013-09-05 DIAGNOSIS — M25579 Pain in unspecified ankle and joints of unspecified foot: Secondary | ICD-10-CM | POA: Diagnosis not present

## 2013-09-19 ENCOUNTER — Ambulatory Visit (INDEPENDENT_AMBULATORY_CARE_PROVIDER_SITE_OTHER): Payer: Medicare Other | Admitting: *Deleted

## 2013-09-19 DIAGNOSIS — Z7901 Long term (current) use of anticoagulants: Secondary | ICD-10-CM | POA: Diagnosis not present

## 2013-09-19 DIAGNOSIS — I2699 Other pulmonary embolism without acute cor pulmonale: Secondary | ICD-10-CM

## 2013-09-19 LAB — POCT INR: INR: 1.9

## 2013-10-10 ENCOUNTER — Ambulatory Visit (INDEPENDENT_AMBULATORY_CARE_PROVIDER_SITE_OTHER): Payer: Medicare Other | Admitting: *Deleted

## 2013-10-10 DIAGNOSIS — I2699 Other pulmonary embolism without acute cor pulmonale: Secondary | ICD-10-CM

## 2013-10-10 DIAGNOSIS — Z7901 Long term (current) use of anticoagulants: Secondary | ICD-10-CM

## 2013-10-10 LAB — POCT INR: INR: 2.6

## 2013-10-10 MED ORDER — WARFARIN SODIUM 5 MG PO TABS: ORAL_TABLET | ORAL | Status: DC

## 2014-04-09 ENCOUNTER — Ambulatory Visit (INDEPENDENT_AMBULATORY_CARE_PROVIDER_SITE_OTHER): Payer: Medicare Other | Admitting: *Deleted

## 2014-04-09 DIAGNOSIS — Z7901 Long term (current) use of anticoagulants: Secondary | ICD-10-CM | POA: Diagnosis not present

## 2014-04-09 DIAGNOSIS — I2699 Other pulmonary embolism without acute cor pulmonale: Secondary | ICD-10-CM

## 2014-04-09 LAB — POCT INR: INR: 3.5

## 2014-04-14 DIAGNOSIS — M545 Low back pain, unspecified: Secondary | ICD-10-CM | POA: Diagnosis not present

## 2014-04-14 DIAGNOSIS — M329 Systemic lupus erythematosus, unspecified: Secondary | ICD-10-CM | POA: Diagnosis not present

## 2014-04-14 DIAGNOSIS — M5430 Sciatica, unspecified side: Secondary | ICD-10-CM | POA: Diagnosis not present

## 2014-04-14 DIAGNOSIS — R209 Unspecified disturbances of skin sensation: Secondary | ICD-10-CM | POA: Diagnosis not present

## 2014-04-14 DIAGNOSIS — F17200 Nicotine dependence, unspecified, uncomplicated: Secondary | ICD-10-CM | POA: Diagnosis not present

## 2014-04-14 DIAGNOSIS — Z86711 Personal history of pulmonary embolism: Secondary | ICD-10-CM | POA: Diagnosis not present

## 2014-04-15 DIAGNOSIS — M47817 Spondylosis without myelopathy or radiculopathy, lumbosacral region: Secondary | ICD-10-CM | POA: Diagnosis not present

## 2014-04-15 DIAGNOSIS — M545 Low back pain, unspecified: Secondary | ICD-10-CM | POA: Diagnosis not present

## 2014-04-15 DIAGNOSIS — M48061 Spinal stenosis, lumbar region without neurogenic claudication: Secondary | ICD-10-CM | POA: Diagnosis not present

## 2014-04-15 DIAGNOSIS — M5126 Other intervertebral disc displacement, lumbar region: Secondary | ICD-10-CM | POA: Diagnosis not present

## 2014-04-17 ENCOUNTER — Ambulatory Visit (INDEPENDENT_AMBULATORY_CARE_PROVIDER_SITE_OTHER): Payer: Medicare Other

## 2014-04-17 DIAGNOSIS — I2699 Other pulmonary embolism without acute cor pulmonale: Secondary | ICD-10-CM | POA: Diagnosis not present

## 2014-04-17 DIAGNOSIS — Z7901 Long term (current) use of anticoagulants: Secondary | ICD-10-CM

## 2014-04-17 LAB — POCT INR: INR: 1.6

## 2014-04-24 ENCOUNTER — Telehealth: Payer: Self-pay | Admitting: Pharmacist

## 2014-04-24 DIAGNOSIS — IMO0002 Reserved for concepts with insufficient information to code with codable children: Secondary | ICD-10-CM | POA: Diagnosis not present

## 2014-04-24 DIAGNOSIS — M5126 Other intervertebral disc displacement, lumbar region: Secondary | ICD-10-CM | POA: Diagnosis not present

## 2014-04-24 NOTE — Telephone Encounter (Signed)
Attempted to call, but this phone number "is not a working number" and can't reach anyone.  Patient has a h/o PE and Lupus.  Would need clearance for procedure from physician, and would also likely need lovenox bridge given her PMH.  Will need to try patient back later to see if her phone is working again.

## 2014-04-24 NOTE — Telephone Encounter (Signed)
New Message  Pt called states that she is scheduled to have a Lower lumbar epidural procedure.and that her coumadin levels will need to come down. Please call back to discuss further

## 2014-04-25 NOTE — Telephone Encounter (Signed)
Spoke with patient [phone]) and explained that we would not give bridge instructions until she got clearance to have this procedure performed by Dr. Vassie Loll (pulmonology).  Apparently it took at least a week to get her lovenox covered in the past as well.  Patient understands and will make an appointment to see Dr. Vassie Loll and will have him fax clearance note to orthopedic and let us know if okay to proceed with bridge.

## 2014-05-05 ENCOUNTER — Encounter (INDEPENDENT_AMBULATORY_CARE_PROVIDER_SITE_OTHER): Payer: Self-pay

## 2014-05-05 ENCOUNTER — Ambulatory Visit (INDEPENDENT_AMBULATORY_CARE_PROVIDER_SITE_OTHER): Payer: Medicare Other

## 2014-05-05 ENCOUNTER — Encounter: Payer: Self-pay | Admitting: Pulmonary Disease

## 2014-05-05 ENCOUNTER — Ambulatory Visit (INDEPENDENT_AMBULATORY_CARE_PROVIDER_SITE_OTHER): Payer: Medicare Other | Admitting: Pulmonary Disease

## 2014-05-05 VITALS — BP 122/78 | HR 80 | Temp 97.0°F | Ht 71.0 in | Wt 224.4 lb

## 2014-05-05 DIAGNOSIS — I2699 Other pulmonary embolism without acute cor pulmonale: Secondary | ICD-10-CM

## 2014-05-05 DIAGNOSIS — Z7901 Long term (current) use of anticoagulants: Secondary | ICD-10-CM | POA: Diagnosis not present

## 2014-05-05 LAB — POCT INR: INR: 3.1

## 2014-05-05 MED ORDER — WARFARIN SODIUM 5 MG PO TABS: ORAL_TABLET | ORAL | Status: DC
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
## 2014-05-05 NOTE — Patient Instructions (Signed)
OK t proceed with spinal injection STOP coumadin 5ds prior - start lovenox twice daily Check INR day or or day prior to procedure to ensure < 1.5 DO not take  lovenox evening before procedure - resume when OK with surgeon

## 2014-05-06 NOTE — Assessment & Plan Note (Signed)
Needs lifelong anticoagulation OK to proceed with spinal injection STOP coumadin 5ds prior - start lovenox twice daily Check INR day or or day prior to procedure to ensure < 1.5 DO not take  lovenox evening before procedure - resume when OK with surgeon Recommendations can be sent to coumadin clinic & to her surgeon

## 2014-10-08 ENCOUNTER — Encounter: Payer: Self-pay | Admitting: *Deleted

## 2014-10-08 ENCOUNTER — Telehealth: Payer: Self-pay | Admitting: *Deleted

## 2014-10-08 NOTE — Telephone Encounter (Signed)
Attempted to call pt, last seen 04/2014 after receiving refill request. Pt number and emergency contact # are both disconnected. Will send letter.

## 2014-10-15 ENCOUNTER — Ambulatory Visit (INDEPENDENT_AMBULATORY_CARE_PROVIDER_SITE_OTHER): Payer: Medicare Other | Admitting: Pharmacist

## 2014-10-15 DIAGNOSIS — I2699 Other pulmonary embolism without acute cor pulmonale: Secondary | ICD-10-CM

## 2014-10-15 LAB — POCT INR: INR: 1

## 2014-10-15 MED ORDER — WARFARIN SODIUM 5 MG PO TABS: ORAL_TABLET | ORAL | Status: DC

## 2014-12-22 ENCOUNTER — Telehealth: Payer: Self-pay | Admitting: Pulmonary Disease

## 2014-12-22 MED ORDER — WARFARIN SODIUM 5 MG PO TABS: ORAL_TABLET | ORAL | Status: DC

## 2014-12-22 NOTE — Telephone Encounter (Signed)
Rx has been sent in. Pt is aware. 

## 2015-02-24 ENCOUNTER — Telehealth: Payer: Self-pay

## 2015-02-24 NOTE — Telephone Encounter (Signed)
Attempted to contact pt overdue for Coumadin follow-up, last INR checked on 10/15/14.  LMOM TCB for appt.  Warfarin was refilled on 12/22/14 by Dr Reginia NaasAlva's office, [facility] since pt has not had INR checked in 4 months and it is unsafe to take medication without proper monitoring and management.  Will await call back from pt to schedule f/u appt in clinic.

## 2015-03-04 ENCOUNTER — Emergency Department (HOSPITAL_COMMUNITY): Payer: Medicare Other

## 2015-03-04 ENCOUNTER — Emergency Department (HOSPITAL_COMMUNITY)
Admission: EM | Admit: 2015-03-04 | Discharge: 2015-03-05 | Disposition: A | Discharge status: Other Acute Inpatient Hospital | Payer: Medicare Other | Attending: Emergency Medicine | Admitting: Emergency Medicine

## 2015-03-04 ENCOUNTER — Encounter (HOSPITAL_COMMUNITY): Payer: Self-pay | Admitting: *Deleted

## 2015-03-04 DIAGNOSIS — Z7901 Long term (current) use of anticoagulants: Secondary | ICD-10-CM | POA: Diagnosis not present

## 2015-03-04 DIAGNOSIS — Y998 Other external cause status: Secondary | ICD-10-CM | POA: Insufficient documentation

## 2015-03-04 DIAGNOSIS — T424X2A Poisoning by benzodiazepines, intentional self-harm, initial encounter: Secondary | ICD-10-CM | POA: Diagnosis not present

## 2015-03-04 DIAGNOSIS — Z720 Tobacco use: Secondary | ICD-10-CM | POA: Insufficient documentation

## 2015-03-04 DIAGNOSIS — T50902A Poisoning by unspecified drugs, medicaments and biological substances, intentional self-harm, initial encounter: Secondary | ICD-10-CM

## 2015-03-04 DIAGNOSIS — F329 Major depressive disorder, single episode, unspecified: Secondary | ICD-10-CM | POA: Insufficient documentation

## 2015-03-04 DIAGNOSIS — T43012A Poisoning by tricyclic antidepressants, intentional self-harm, initial encounter: Secondary | ICD-10-CM | POA: Insufficient documentation

## 2015-03-04 DIAGNOSIS — W228XXA Striking against or struck by other objects, initial encounter: Secondary | ICD-10-CM | POA: Diagnosis not present

## 2015-03-04 DIAGNOSIS — T421X2A Poisoning by iminostilbenes, intentional self-harm, initial encounter: Secondary | ICD-10-CM | POA: Insufficient documentation

## 2015-03-04 DIAGNOSIS — S0083XA Contusion of other part of head, initial encounter: Secondary | ICD-10-CM | POA: Diagnosis not present

## 2015-03-04 DIAGNOSIS — R9431 Abnormal electrocardiogram [ECG] [EKG]: Secondary | ICD-10-CM | POA: Diagnosis not present

## 2015-03-04 DIAGNOSIS — Z86711 Personal history of pulmonary embolism: Secondary | ICD-10-CM | POA: Diagnosis not present

## 2015-03-04 DIAGNOSIS — Z79899 Other long term (current) drug therapy: Secondary | ICD-10-CM | POA: Insufficient documentation

## 2015-03-04 DIAGNOSIS — S199XXA Unspecified injury of neck, initial encounter: Secondary | ICD-10-CM | POA: Insufficient documentation

## 2015-03-04 DIAGNOSIS — Y9389 Activity, other specified: Secondary | ICD-10-CM | POA: Diagnosis not present

## 2015-03-04 DIAGNOSIS — T45512A Poisoning by anticoagulants, intentional self-harm, initial encounter: Secondary | ICD-10-CM | POA: Insufficient documentation

## 2015-03-04 DIAGNOSIS — T426X2A Poisoning by other antiepileptic and sedative-hypnotic drugs, intentional self-harm, initial encounter: Secondary | ICD-10-CM | POA: Diagnosis not present

## 2015-03-04 DIAGNOSIS — M1990 Unspecified osteoarthritis, unspecified site: Secondary | ICD-10-CM | POA: Insufficient documentation

## 2015-03-04 DIAGNOSIS — S0990XA Unspecified injury of head, initial encounter: Secondary | ICD-10-CM | POA: Diagnosis not present

## 2015-03-04 DIAGNOSIS — T39092A Poisoning by salicylates, intentional self-harm, initial encounter: Secondary | ICD-10-CM | POA: Diagnosis not present

## 2015-03-04 DIAGNOSIS — W19XXXA Unspecified fall, initial encounter: Secondary | ICD-10-CM

## 2015-03-04 DIAGNOSIS — T50904A Poisoning by unspecified drugs, medicaments and biological substances, undetermined, initial encounter: Secondary | ICD-10-CM | POA: Diagnosis not present

## 2015-03-04 DIAGNOSIS — Y9289 Other specified places as the place of occurrence of the external cause: Secondary | ICD-10-CM | POA: Insufficient documentation

## 2015-03-04 HISTORY — DX: Major depressive disorder, single episode, unspecified: F32.9

## 2015-03-04 HISTORY — DX: Anxiety disorder, unspecified: F41.9

## 2015-03-04 HISTORY — DX: Depression, unspecified: F32.A

## 2015-03-04 LAB — COMPREHENSIVE METABOLIC PANEL
ALT: 40 U/L — ABNORMAL HIGH (ref 0–35)
AST: 56 U/L — ABNORMAL HIGH (ref 0–37)
Albumin: 3.8 g/dL (ref 3.5–5.2)
Alkaline Phosphatase: 73 U/L (ref 39–117)
Anion gap: 11 (ref 5–15)
BUN: 7 mg/dL (ref 6–23)
CO2: 19 mmol/L (ref 19–32)
Calcium: 8.6 mg/dL (ref 8.4–10.5)
Chloride: 110 mmol/L (ref 96–112)
Creatinine, Ser: 0.81 mg/dL (ref 0.50–1.10)
GFR calc Af Amer: >90 mL/min (ref >90)
GFR calc non Af Amer: 86 mL/min — ABNORMAL LOW (ref >90)
Glucose, Bld: 80 mg/dL (ref 70–99)
Potassium: 3.8 mmol/L (ref 3.5–5.1)
Sodium: 140 mmol/L (ref 135–145)
Total Bilirubin: 0.5 mg/dL (ref 0.3–1.2)
Total Protein: 7.1 g/dL (ref 6.0–8.3)

## 2015-03-04 LAB — CBC
HCT: 42.5 % (ref 36.0–46.0)
Hemoglobin: 14.6 g/dL (ref 12.0–15.0)
MCH: 29.3 pg (ref 26.0–34.0)
MCHC: 34.4 g/dL (ref 30.0–36.0)
MCV: 85.2 fL (ref 78.0–100.0)
Platelets: 245 10*3/uL (ref 150–400)
RBC: 4.99 MIL/uL (ref 3.87–5.11)
RDW: 15.4 % (ref 11.5–15.5)
WBC: 12.3 10*3/uL — ABNORMAL HIGH (ref 4.0–10.5)

## 2015-03-04 LAB — RAPID URINE DRUG SCREEN, HOSP PERFORMED
Amphetamines: NOT DETECTED
Barbiturates: NOT DETECTED
Benzodiazepines: POSITIVE — AB
Cocaine: NOT DETECTED
Opiates: NOT DETECTED
Tetrahydrocannabinol: NOT DETECTED

## 2015-03-04 LAB — I-STAT BETA HCG BLOOD, ED (MC, WL, AP ONLY): I-stat hCG, quantitative: <5 m[IU]/mL (ref <5)

## 2015-03-04 LAB — CARBAMAZEPINE LEVEL, TOTAL: Carbamazepine Lvl: 10 ug/mL (ref 4.0–12.0)

## 2015-03-04 LAB — SALICYLATE LEVEL: Salicylate Lvl: <4 mg/dL (ref 2.8–20.0)

## 2015-03-04 LAB — PROTIME-INR
INR: 2.92 — ABNORMAL HIGH (ref 0.00–1.49)
Prothrombin Time: 30.7 seconds — ABNORMAL HIGH (ref 11.6–15.2)

## 2015-03-04 LAB — ACETAMINOPHEN LEVEL: Acetaminophen (Tylenol), Serum: <10 ug/mL — ABNORMAL LOW (ref 10–30)

## 2015-03-04 LAB — ETHANOL: Alcohol, Ethyl (B): <5 mg/dL (ref 0–9)

## 2015-03-04 MED ORDER — SODIUM CHLORIDE 0.9 % IV SOLN
INTRAVENOUS | Status: DC
  Administered 2015-03-04: 20 mL/h via INTRAVENOUS

## 2015-03-04 NOTE — BH Assessment (Signed)
Tele Assessment Note   Crystal Frank is a 49 y.o. female who voluntarily presents to [facility] with SI thoughts and depression.  Pt was brought in by EMS after a significant overdose.  Pt told this writer she was depressed and wrote a SI note and ingested several medications in a SI attempt--she took warfaren 37.5 mg, tegretol, and topamax(7 pills each), she also took  and elevil . Pt expected to fall asleep and not wake up, however the neighbor found her on the floor of her home.  Pt was unable to ambulate without falling and EMS was called.  Pt states she's been SI x2days because of a bad break up with her girlfriend and financial problems.  Pt says she feels helpless and hopeless.  Pt denies HI/AVH. Pt has no past SI attempts, no past psych admissions.   Axis I: Depressive Disorder NOS Axis II: Deferred Axis III:  Past Medical History  Diagnosis Date  . Pulmonary embolism 2008    seen at [facility]--right lung  . Lupus   . Pleurisy   . 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 Arthritis   . Osteomyelitis   . Depression   . Anxiety    Axis IV: other psychosocial or environmental problems, problems related to social environment and problems with primary support group Axis V: 31-40 impairment in reality testing  Past Medical History:  Past Medical History  Diagnosis Date  . Pulmonary embolism 2008    seen at [facility]--right lung  . Lupus   . Pleurisy   . 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 Arthritis   . Osteomyelitis   . Depression   . Anxiety     Past Surgical History  Procedure Laterality Date  . Multiple orthopeadic surgeries      both ankles  . Ankle fusions      bilat fusions, multiple  . Hardware removal  09/25/2012    Procedure: HARDWARE REMOVAL;  Surgeon: Budd Palmer, MD;  [facility] OR;  Service: Orthopedics;  Laterality: Left;  HARDWARE REMOVAL LEFT ANKLE   . Tibia osteotomy   09/25/2012    Procedure: TIBIAL OSTEOTOMY;  Surgeon: Budd Palmer, MD;  Location: [facility] OR;  Service: Orthopedics;;  partial excision of tibia, placement of non-biodegradeable drug delivery device, left tibia  . Ankle arthrodesis  10/23/2012  . Ankle fusion  10/23/2012    Procedure: ARTHRODESIS ANKLE;  Surgeon: Budd Palmer, MD;  Location: [facility] OR;  Service: Orthopedics;  Laterality: Left;  REMOVAL RETAINED ANTIBIOTIC NAIL LEFT TIBIA/TIBIO TALAR FUSION    Family History:  Family History  Problem Relation Age of Onset  . Clotting disorder Paternal Aunt   . Cancer Maternal Uncle     Social History:  reports that she has been smoking Cigarettes.  She has a 13.5 pack-year smoking history. She has never used smokeless tobacco. She reports that she drinks alcohol. She reports that she uses illicit drugs (Oxycodone, Cocaine, and Marijuana).  Additional Social History:  Alcohol / Drug Use Pain Medications: See MAR  Prescriptions: See MAR  Over the Counter: See MAR  History of alcohol / drug use?: No history of alcohol / drug abuse Longest period of sobriety (when/how long): Denies chronic use, states that she drank 2-24oz beers prior to the overdose   CIWA: CIWA-Ar BP: 109/58 mmHg Pulse Rate: 99 COWS:    PATIENT STRENGTHS: (choose at least two) Motivation for  treatment/growth  Allergies: No Known Allergies  Home Medications:  (Not in a hospital admission)  OB/GYN Status:  Patient's last menstrual period was 02/18/2015.  General Assessment Data Location of Assessment[facility] ED Is this a Tele or Face-to-Face Assessment?: Tele Assessment Is this an Initial Assessment or a Re-assessment for this encounter?: Initial Assessment Living Arrangements: Alone Can pt return to current living arrangement?: Yes Admission Status: Voluntary Is patient capable of signing voluntary admission?: Yes Transfer from: Home Referral Source: Self/Family/Friend  Medical Screening Exam [facility] Walk-in  ONLY) Medical Exam completed: No Reason for MSE not completed: Other: (None )  [facility] Living Arrangements: Alone Name of Psychiatrist: None  Name of Therapist: None   Education Status Is patient currently in school?: No Current Grade: None  Highest grade of school patient has completed: None  Name of school: None  Contact person: None   Risk to self with the past 6 months Suicidal Ideation: Yes-Currently Present Suicidal Intent: Yes-Currently Present Is patient at risk for suicide?: Yes Suicidal Plan?: Yes-Currently Present Specify Current Suicidal Plan: Overdose on pills  Access to Means: Yes Specify Access to Suicidal Means: Pills What has been your use of drugs/alcohol within the last 12 months?: Pt denies--per hx cocaine, thc,oxycodone  Previous Attempts/Gestures: No How many times?: 0 Other Self Harm Risks: None  Triggers for Past Attempts: None known Intentional Self Injurious Behavior: None Family Suicide History: No Recent stressful life event(s): Loss (Comment), Financial Problems (Break up with girlfriend ) Persecutory voices/beliefs?: No Depression: Yes Depression Symptoms: Loss of interest in usual pleasures, Feeling worthless/self pity Substance abuse history and/or treatment for substance abuse?: No Suicide prevention information given to non-admitted patients: Not applicable  Risk to Others within the past 6 months Homicidal Ideation: No Thoughts of Harm to Others: No Current Homicidal Intent: No Current Homicidal Plan: No Access to Homicidal Means: No Identified Victim: None  History of harm to others?: No Assessment of Violence: None Noted Violent Behavior Description: None  Does patient have access to weapons?: No Criminal Charges Pending?: No Does patient have a court date: No  Psychosis Hallucinations: None noted Delusions: None noted  Mental Status Report Appearance/Hygiene: Disheveled, In scrubs Eye Contact: Fair Motor  Activity: Unremarkable Speech: Logical/coherent, Soft Level of Consciousness: Alert Mood: Depressed Affect: Depressed, Appropriate to circumstance Anxiety Level: Minimal Thought Processes: Coherent, Relevant Judgement: Impaired Orientation: Person, Place, Time, Situation Obsessive Compulsive Thoughts/Behaviors: None  Cognitive Functioning Concentration: Normal Memory: Recent Intact, Remote Intact IQ: Average Insight: Poor Impulse Control: Poor Appetite: Good Weight Loss: 0 Weight Gain: 0 Sleep: Decreased Total Hours of Sleep: 5 Vegetative Symptoms: None  ADLScreening [facility] Assessment Services) Patient's cognitive ability adequate to safely complete daily activities?: Yes Patient able to express need for assistance with ADLs?: Yes Independently performs ADLs?: Yes (appropriate for developmental age)  Prior Inpatient Therapy Prior Inpatient Therapy: No Prior Therapy Dates: None  Prior Therapy Facilty/Provider(s): None  Reason for Treatment: None   Prior Outpatient Therapy Prior Outpatient Therapy: No Prior Therapy Dates: None  Prior Therapy Facilty/Provider(s): None  Reason for Treatment: None   ADL Screening (condition at time of admission) Patient's cognitive ability adequate to safely complete daily activities?: Yes Is the patient deaf or have difficulty hearing?: No Does the patient have difficulty seeing, even when wearing glasses/contacts?: No Does the patient have difficulty concentrating, remembering, or making decisions?: No Patient able to express need for assistance with ADLs?: Yes Does the patient have difficulty dressing or bathing?: No Independently performs  ADLs?: Yes (appropriate for developmental age) Does the patient have difficulty walking or climbing stairs?: No Weakness of Legs: None Weakness of Arms/Hands: None  Home Assistive Devices/Equipment Home Assistive Devices/Equipment: None  Therapy Consults (therapy consults require a physician  order) PT Evaluation Needed: No OT Evalulation Needed: No SLP Evaluation Needed: No Abuse/Neglect Assessment (Assessment to be complete while patient is alone) Physical Abuse: Denies Verbal Abuse: Denies Sexual Abuse: Denies Exploitation of patient/patient's resources: Denies Self-Neglect: Denies Values / Beliefs Cultural Requests During Hospitalization: None Spiritual Requests During Hospitalization: None Consults Spiritual Care Consult Needed: No Social Work Consult Needed: No Merchant navy officer (For Healthcare) Does patient have an advance directive?: No Would patient like information on creating an advanced directive?: No - patient declined information    Additional Information 1:1 In Past 12 Months?: No CIRT Risk: No Elopement Risk: No Does patient have medical clearance?: Yes     Disposition:  Disposition Initial Assessment Completed for this Encounter: Yes Disposition of Patient: Inpatient treatment program, Referred to (Per Donell Sievert, PA pt meets criteria for inpt admit ) Type of inpatient treatment program: Adult Patient referred to: Other (Comment) (Per Donell Sievert, PA meets criteria for inpt admit)  Murrell Redden 03/04/2015 11:17 PM

## 2015-03-04 NOTE — ED Notes (Signed)
Sitter at the bedside.

## 2015-03-04 NOTE — ED Notes (Signed)
Poison Control updated on PT condition. PC states future order for another PT/INR need on next 24 Hours.

## 2015-03-04 NOTE — ED Notes (Signed)
CALLED STAFFING OR SI SITTER. STATED THAT THEY HAD A SITTER COMING TO SIT @ 7:00PM

## 2015-03-04 NOTE — ED Provider Notes (Signed)
CSN: [id number]     Arrival date & time 03/04/15  1806 History   First MD Initiated Contact with Patient 03/04/15 1810     Chief Complaint  Patient presents with  . Drug Overdose     (Consider location/radiation/quality/duration/timing/severity/associated sxs/prior Treatment) Patient is a 49 y.o. female presenting with Overdose. The history is provided by the patient.  Drug Overdose Pertinent negatives include no chest pain, no abdominal pain, no headaches and no shortness of breath.  Patient c/o recent worsening depression and overdose of medication.  Patient indicates depressed about money issues, and recent relationship/breakup.  States took several klonopin, elavil, coumadin, tegretol and topamax pills last pm at approximately 11 pm.  Denies any other ingestion or attempt at self harm. States is on coumadin chronically for hx  PE.  Does not fell forward last pm. Hit head. C/o neck pain. No radicular pain. No numbness/weakness. No nv. No fever or chills. No cp or sob. No abd pain. Denies any abn bruising or bleeding. States physical health at baseline.     Past Medical History  Diagnosis Date  . Pulmonary embolism 2008    seen at [facility]--right lung  . Lupus   . Pleurisy   . 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 Arthritis   . Osteomyelitis    Past Surgical History  Procedure Laterality Date  . Multiple orthopeadic surgeries      both ankles  . Ankle fusions      bilat fusions, multiple  . Hardware removal  09/25/2012    Procedure: HARDWARE REMOVAL;  Surgeon: Budd Palmer, MD;  Location: [facility] OR;  Service: Orthopedics;  Laterality: Left;  HARDWARE REMOVAL LEFT ANKLE   . Tibia osteotomy  09/25/2012    Procedure: TIBIAL OSTEOTOMY;  Surgeon: Budd Palmer, MD;  Location: [facility] OR;  Service: Orthopedics;;  partial excision of tibia, placement of non-biodegradeable drug delivery device, left tibia  . Ankle arthrodesis  10/23/2012  . Ankle  fusion  10/23/2012    Procedure: ARTHRODESIS ANKLE;  Surgeon: Budd Palmer, MD;  Location: [facility] OR;  Service: Orthopedics;  Laterality: Left;  REMOVAL RETAINED ANTIBIOTIC NAIL LEFT TIBIA/TIBIO TALAR FUSION   Family History  Problem Relation Age of Onset  . Clotting disorder Paternal Aunt   . Cancer Maternal Uncle    History  Substance Use Topics  . Smoking status: Current Every Day Smoker -- 0.50 packs/day for 27 years    Types: Cigarettes  . Smokeless tobacco: Never Used  . Alcohol Use: Yes     Comment: ocassionally   OB History    No data available     Review of Systems  Constitutional: Negative for fever and chills.  HENT: Negative for nosebleeds and sore throat.   Eyes: Negative for redness.  Respiratory: Negative for cough and shortness of breath.   Cardiovascular: Negative for chest pain and leg swelling.  Gastrointestinal: Negative for vomiting, abdominal pain, diarrhea and blood in stool.  Endocrine: Negative for polyuria.  Genitourinary: Negative for dysuria, hematuria and flank pain.  Musculoskeletal: Positive for neck pain. Negative for back pain and neck stiffness.  Skin: Negative for rash.  Neurological: Negative for weakness, numbness and headaches.  Hematological: Does not bruise/bleed easily.  Psychiatric/Behavioral: Positive for suicidal ideas and dysphoric mood.      Allergies  Review of patient's allergies indicates no known allergies.  Home Medications   Prior to Admission medications   Medication Sig Start Date End  Date Taking? Authorizing Provider  naproxen sodium (ANAPROX) 220 MG tablet Take 220 mg by mouth daily as needed.   Yes Historical Provider, MD  warfarin (COUMADIN) 5 MG tablet Take as directed by coumadin clinic Patient taking differently: Take 12.5-15 mg by mouth daily at 6 PM. Takes 3 tabs daily on Mon, wed and Fri Takes 2.5mg  daily all other days 12/22/14  Yes Oretha Milch, MD  docusate sodium (COLACE) 100 MG capsule Take 100 mg by  mouth 2 (two) times daily as needed. For constipation    Historical Provider, MD  pregabalin (LYRICA) 150 MG capsule Take 150 mg by mouth 2 (two) times daily.    Historical Provider, MD   BP 144/85 mmHg  Pulse 116  Temp(Src) 97.8 F (36.6 C) (Oral)  Resp 19  Ht  (1.803 m)  Wt 224 lb (101.606 kg)  BMI 31.26 kg/m2  SpO2 99%  LMP 02/18/2015 Physical Exam  Constitutional: She is oriented to person, place, and time. She appears well-developed and well-nourished. No distress.  HENT:  Contusion forehead.   Eyes: Conjunctivae are normal. Pupils are equal, round, and reactive to light. No scleral icterus.  Neck: Neck supple. No tracheal deviation present.  Cardiovascular: Normal rate, regular rhythm, normal heart sounds and intact distal pulses.   Pulmonary/Chest: Effort normal and breath sounds normal. No respiratory distress. She exhibits no tenderness.  Abdominal: Soft. Normal appearance and bowel sounds are normal. She exhibits no distension and no mass. There is no tenderness. There is no rebound and no guarding.  Genitourinary:  No cva tenderness  Musculoskeletal: Normal range of motion. She exhibits no edema or tenderness.  Mid cervical tenderness, otherwise, CTLS spine, non tender, aligned, no step off. Good rom bil ext without pain or focal bony tenderness.   Neurological: She is alert and oriented to person, place, and time.  Motor intact bil. stre 5/5. sens grossly intact.   Skin: Skin is warm and dry. No rash noted.  Psychiatric:  Depressed mood, tearful. +SI.  Nursing note and vitals reviewed.   ED Course  Procedures (including critical care time) Labs Review  Results for orders placed or performed during the hospital encounter of 03/04/15  Acetaminophen level  Result Value Ref Range   Acetaminophen (Tylenol), Serum <10.0 (L) 10 - 30 ug/mL  Salicylate level  Result Value Ref Range   Salicylate Lvl <4.0 2.8 - 20.0 mg/dL  CBC  Result Value Ref Range   WBC 12.3  (H) 4.0 - 10.5 K/uL   RBC 4.99 3.87 - 5.11 MIL/uL   Hemoglobin 14.6 12.0 - 15.0 g/dL   HCT 16.1 09.6 - 04.5 %   MCV 85.2 78.0 - 100.0 fL   MCH 29.3 26.0 - 34.0 pg   MCHC 34.4 30.0 - 36.0 g/dL   RDW 40.9 81.1 - 91.4 %   Platelets 245 150 - 400 K/uL  Comprehensive metabolic panel  Result Value Ref Range   Sodium 140 135 - 145 mmol/L   Potassium 3.8 3.5 - 5.1 mmol/L   Chloride 110 96 - 112 mmol/L   CO2 19 19 - 32 mmol/L   Glucose, Bld 80 70 - 99 mg/dL   BUN 7 6 - 23 mg/dL   Creatinine, Ser 7.82 0.50 - 1.10 mg/dL   Calcium 8.6 8.4 - 95.6 mg/dL   Total Protein 7.1 6.0 - 8.3 g/dL   Albumin 3.8 3.5 - 5.2 g/dL   AST 56 (H) 0 - 37 U/L   ALT 40 (H)  0 - 35 U/L   Alkaline Phosphatase 73 39 - 117 U/L   Total Bilirubin 0.5 0.3 - 1.2 mg/dL   GFR calc non Af Amer 86 (L) >90 mL/min   GFR calc Af Amer >90 >90 mL/min   Anion gap 11 5 - 15  Ethanol  Result Value Ref Range   Alcohol, Ethyl (B) <5 0 - 9 mg/dL  Protime-INR  Result Value Ref Range   Prothrombin Time 30.7 (H) 11.6 - 15.2 seconds   INR 2.92 (H) 0.00 - 1.49  Urine rapid drug screen (hosp performed)  Result Value Ref Range   Opiates NONE DETECTED NONE DETECTED   Cocaine NONE DETECTED NONE DETECTED   Benzodiazepines POSITIVE (A) NONE DETECTED   Amphetamines NONE DETECTED NONE DETECTED   Tetrahydrocannabinol NONE DETECTED NONE DETECTED   Barbiturates NONE DETECTED NONE DETECTED  Carbamazepine level, total  Result Value Ref Range   Carbamazepine Lvl 10.0 4.0 - 12.0 ug/mL  I-Stat beta hCG blood, ED ([facility], [facility], [facility] only)  Result Value Ref Range   I-stat hCG, quantitative <5.0 <5 mIU/mL   Comment 3           Ct Head Wo Contrast  03/04/2015   CLINICAL DATA:  Fall, found by a neighbor on floor at her house. Unable to ambulate without falling.  EXAM: CT HEAD WITHOUT CONTRAST  CT CERVICAL SPINE WITHOUT CONTRAST  TECHNIQUE: Multidetector CT imaging of the head and cervical spine was performed following the standard protocol without  intravenous contrast. Multiplanar CT image reconstructions of the cervical spine were also generated.  COMPARISON:  08/23/2013  FINDINGS: CT HEAD FINDINGS  No intracranial hemorrhage, mass effect, or midline shift. No hydrocephalus. The basilar cisterns are patent. No evidence of territorial infarct. No intracranial fluid collection. Calvarium is intact. There is diffuse mucosal thickening of the paranasal sinuses with small fluid level in the right maxillary sinus. Mastoid air cells are well aerated.  CT CERVICAL SPINE FINDINGS  Cervical spine alignment is maintained. Vertebral body heights are preserved. There is no fracture. The dens is intact. There are no jumped or perched facets. There is mild disc space narrowing at C5-C6. Remaining disc spaces are preserved. No prevertebral soft tissue edema.  IMPRESSION: 1.  No acute intracranial abnormality. 2. No fracture or subluxation of the cervical spine. Mild degenerative disc disease at C5-C6.   Electronically Signed   By: Rubye Oaks M.D.   On: 03/04/2015 19:18   Ct Cervical Spine Wo Contrast  03/04/2015   CLINICAL DATA:  Fall, found by a neighbor on floor at her house. Unable to ambulate without falling.  EXAM: CT HEAD WITHOUT CONTRAST  CT CERVICAL SPINE WITHOUT CONTRAST  TECHNIQUE: Multidetector CT imaging of the head and cervical spine was performed following the standard protocol without intravenous contrast. Multiplanar CT image reconstructions of the cervical spine were also generated.  COMPARISON:  08/23/2013  FINDINGS: CT HEAD FINDINGS  No intracranial hemorrhage, mass effect, or midline shift. No hydrocephalus. The basilar cisterns are patent. No evidence of territorial infarct. No intracranial fluid collection. Calvarium is intact. There is diffuse mucosal thickening of the paranasal sinuses with small fluid level in the right maxillary sinus. Mastoid air cells are well aerated.  CT CERVICAL SPINE FINDINGS  Cervical spine alignment is maintained.  Vertebral body heights are preserved. There is no fracture. The dens is intact. There are no jumped or perched facets. There is mild disc space narrowing at C5-C6. Remaining disc spaces are preserved. No prevertebral  soft tissue edema.  IMPRESSION: 1.  No acute intracranial abnormality. 2. No fracture or subluxation of the cervical spine. Mild degenerative disc disease at C5-C6.   Electronically Signed   By: Rubye OaksMelanie  Ehinger M.D.   On: 03/04/2015 19:18       EKG Interpretation   Date/Time:  Wednesday March 04 2015 19:59:57 EDT Ventricular Rate:  105 PR Interval:  161 QRS Duration: 79 QT Interval:  354 QTC Calculation: 468 R Axis:   20 Text Interpretation:  Sinus tachycardia Confirmed by Denton LankSTEINL  MD, Caryn BeeKEVIN  ([id number]) on 03/04/2015 8:09:15 PM      MDM   Iv ns. Continuous pulse ox and monitor. Labs.  Reviewed nursing notes and prior charts for additional history.   Discussed w gpd, they found suicide note at scene.  Pt indicates ingested meds at approximately 2300 last pm, no meds/ingestion since.  Recheck, pt fully awake and alert. No resp depression. Vitals normal.  Approximately 24 hours post ingestion, pt states feels improved from prior.  Will get behavioral health team consult, and anticipate pt will require inpatient psych placement.  Will monitor INR/repeat 8 hrs post initial. If increases significantly, may need rx.  As reported overdose unclear amount 24 hrs ago, will hold additional coumadin for now.  Recheck pt remains alert, no distress, vitals normal.   Signed out to overnight MD, Dr Norlene Campbelltter, to check repeat INR when resulted and manage appropriately.  If INRs remain stable, would plan to restart coumadin then.  Disposition per psych team.         Cathren LaineKevin Yasser Hepp, MD 03/04/15 [phone]

## 2015-03-04 NOTE — ED Notes (Signed)
Pt states she was depressed last night and wrote a si note and took several medications:  warfaren 37.5 mg, tegratol and topamx 7 pills each, clonopin 37 mg, elevil 400mg . And expected to fall asleep and not wake up.  Pt was found by neighbor on floor of her house, pt was unable to ambulate without falling and ems was called.

## 2015-03-04 NOTE — ED Notes (Signed)
Pt atempted to use the bathroom (bedpan), but unable to go.

## 2015-03-05 ENCOUNTER — Encounter (HOSPITAL_COMMUNITY): Payer: Self-pay

## 2015-03-05 ENCOUNTER — Inpatient Hospital Stay (HOSPITAL_COMMUNITY)
Admission: AD | Admit: 2015-03-05 | Discharge: 2015-03-10 | DRG: 885 | Disposition: A | Discharge status: Home / Self Care | Payer: Medicare Other | Source: Intra-hospital | Attending: Psychiatry | Admitting: Psychiatry

## 2015-03-05 DIAGNOSIS — F322 Major depressive disorder, single episode, severe without psychotic features: Secondary | ICD-10-CM | POA: Diagnosis not present

## 2015-03-05 DIAGNOSIS — F4323 Adjustment disorder with mixed anxiety and depressed mood: Secondary | ICD-10-CM | POA: Diagnosis present

## 2015-03-05 DIAGNOSIS — Z86711 Personal history of pulmonary embolism: Secondary | ICD-10-CM

## 2015-03-05 DIAGNOSIS — Z7901 Long term (current) use of anticoagulants: Secondary | ICD-10-CM | POA: Diagnosis not present

## 2015-03-05 DIAGNOSIS — T45512A Poisoning by anticoagulants, intentional self-harm, initial encounter: Secondary | ICD-10-CM | POA: Diagnosis not present

## 2015-03-05 DIAGNOSIS — T50902A Poisoning by unspecified drugs, medicaments and biological substances, intentional self-harm, initial encounter: Secondary | ICD-10-CM | POA: Diagnosis not present

## 2015-03-05 DIAGNOSIS — I2699 Other pulmonary embolism without acute cor pulmonale: Secondary | ICD-10-CM | POA: Diagnosis not present

## 2015-03-05 DIAGNOSIS — Z599 Problem related to housing and economic circumstances, unspecified: Secondary | ICD-10-CM

## 2015-03-05 DIAGNOSIS — M1990 Unspecified osteoarthritis, unspecified site: Secondary | ICD-10-CM | POA: Diagnosis present

## 2015-03-05 DIAGNOSIS — T421X2A Poisoning by iminostilbenes, intentional self-harm, initial encounter: Secondary | ICD-10-CM | POA: Diagnosis not present

## 2015-03-05 DIAGNOSIS — T43012A Poisoning by tricyclic antidepressants, intentional self-harm, initial encounter: Secondary | ICD-10-CM | POA: Diagnosis not present

## 2015-03-05 DIAGNOSIS — R45851 Suicidal ideations: Secondary | ICD-10-CM | POA: Diagnosis not present

## 2015-03-05 DIAGNOSIS — F17210 Nicotine dependence, cigarettes, uncomplicated: Secondary | ICD-10-CM | POA: Diagnosis present

## 2015-03-05 DIAGNOSIS — Y9329 Activity, other involving ice and snow: Secondary | ICD-10-CM | POA: Diagnosis not present

## 2015-03-05 DIAGNOSIS — T424X2A Poisoning by benzodiazepines, intentional self-harm, initial encounter: Secondary | ICD-10-CM | POA: Diagnosis not present

## 2015-03-05 DIAGNOSIS — T50912A Poisoning by multiple unspecified drugs, medicaments and biological substances, intentional self-harm, initial encounter: Secondary | ICD-10-CM | POA: Diagnosis present

## 2015-03-05 DIAGNOSIS — G8929 Other chronic pain: Secondary | ICD-10-CM | POA: Diagnosis present

## 2015-03-05 DIAGNOSIS — T426X2A Poisoning by other antiepileptic and sedative-hypnotic drugs, intentional self-harm, initial encounter: Secondary | ICD-10-CM | POA: Diagnosis not present

## 2015-03-05 LAB — PROTIME-INR
INR: 2.75 — ABNORMAL HIGH (ref 0.00–1.49)
Prothrombin Time: 29.3 seconds — ABNORMAL HIGH (ref 11.6–15.2)

## 2015-03-05 MED ORDER — WARFARIN SODIUM 2.5 MG PO TABS
12.5000 mg | ORAL_TABLET | ORAL | Status: DC
  Filled 2015-03-05: qty 1

## 2015-03-05 MED ORDER — DULOXETINE HCL 20 MG PO CPEP
20.0000 mg | ORAL_CAPSULE | Freq: Every day | ORAL | Status: DC
  Administered 2015-03-06: 20 mg via ORAL
  Filled 2015-03-05 (×4): qty 1

## 2015-03-05 MED ORDER — WARFARIN SODIUM 10 MG PO TABS
10.0000 mg | ORAL_TABLET | Freq: Once | ORAL | Status: DC
  Filled 2015-03-05: qty 1

## 2015-03-05 MED ORDER — ACETAMINOPHEN 325 MG PO TABS
650.0000 mg | ORAL_TABLET | Freq: Once | ORAL | Status: AC
  Administered 2015-03-05: 650 mg via ORAL
  Filled 2015-03-05: qty 2

## 2015-03-05 MED ORDER — WARFARIN SODIUM 7.5 MG PO TABS: 15.0000 mg | ORAL_TABLET | ORAL | Status: DC

## 2015-03-05 MED ORDER — ACETAMINOPHEN 325 MG PO TABS
650.0000 mg | ORAL_TABLET | Freq: Four times a day (QID) | ORAL | Status: DC | PRN
  Administered 2015-03-07: 650 mg via ORAL
  Filled 2015-03-05: qty 2

## 2015-03-05 MED ORDER — WARFARIN - PHARMACIST DOSING INPATIENT: Freq: Every day | Status: DC

## 2015-03-05 MED ORDER — TRAZODONE HCL 50 MG PO TABS
50.0000 mg | ORAL_TABLET | Freq: Every evening | ORAL | Status: DC | PRN
  Administered 2015-03-06: 50 mg via ORAL
  Filled 2015-03-05: qty 1

## 2015-03-05 MED ORDER — MAGNESIUM HYDROXIDE 400 MG/5ML PO SUSP: 30.0000 mL | Freq: Every day | ORAL | Status: DC | PRN

## 2015-03-05 MED ORDER — ALUM & MAG HYDROXIDE-SIMETH 200-200-20 MG/5ML PO SUSP: 30.0000 mL | ORAL | Status: DC | PRN

## 2015-03-05 MED ORDER — WARFARIN - PHARMACIST DOSING INPATIENT
Freq: Every day | Status: DC
  Filled 2015-03-05 (×9): qty 1

## 2015-03-05 MED ORDER — WARFARIN - PHARMACIST DOSING INPATIENT
Freq: Every day | Status: DC
  Administered 2015-03-06: 18:00:00
  Filled 2015-03-05 (×14): qty 1

## 2015-03-05 NOTE — ED Notes (Signed)
[place] transportation contacted 

## 2015-03-05 NOTE — Progress Notes (Signed)
Patient ID: Crystal ButtsJennifer L Whitesel, female   DOB: Jul 20, 1966, 49 y.o.   MRN: [id number]  [facility]/p OD on Coumadin, Tegretol, Elavil, and Topamax,  pt in wheelchair, unsteady on feet, states that she has had several recent falls at home, high fall risk protocol initiated and maintained, educated pt on fall prevention and high fall risk protocol, pt denies current SI/HI/AVH, cooperative during the admission, slurred speech, flat and depressed, states that this is her first time being admitted to a psych facility, pt had recent break up with her girlfriend and has been having some financial difficulties, per ED report pt was found in her home by a neighbor with a suicide note a day after she had taken the overdose of medications, pt denies pain at this time, oriented pt to unit and rule, skin and contraband search done, skin intact, no contraband found.

## 2015-03-05 NOTE — Progress Notes (Signed)
ANTICOAGULATION CONSULT NOTE - Initial Consult  Pharmacy Consult for Coumadin Indication: pulmonary embolus  No Known Allergies  Patient Measurements: Height: 5\' 11"  (180.3 cm) Weight: 224 lb (101.606 kg) IBW/kg (Calculated) : 70.8  Vital Signs: Temp: 97.9 F (36.6 C) (04/14 0551) Temp Source: Oral (04/14 0551) BP: 115/63 mmHg (04/14 0551) Pulse Rate: 96 (04/14 0551)  Labs:  Recent Labs  03/04/15 1930 03/04/15 1959 03/05/15 0417  HGB 14.6  --   --   HCT 42.5  --   --   PLT 245  --   --   LABPROT  --  30.7* 29.3*  INR  --  2.92* 2.75*  CREATININE  --  0.81  --     Estimated Creatinine Clearance: 113.8 mL/min (by C-G formula based on Cr of 0.81).   Medical History: Past Medical History  Diagnosis Date  . Pulmonary embolism 2008    seen at [facility]--right lung  . Lupus   . Pleurisy   . 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 Arthritis   . Osteomyelitis   . Depression   . Anxiety       Assessment: 49yo female took OD of several medications (warfarin, Tegretol, Topamax) for SI on 4/12, now holding in pod C, to continue Coumadin for PE; of note it is unclear how much warfarin pt took on 4/12 though notes mention 37.5mg , currently INR within goal x2 w/ high usual dose making 37.5mg  only ~3d worth.  Goal of Therapy:  INR 2-3   Plan:  Will continue home Coumadin dose of 15mg  MWF and 12.5mg  TTSS and monitor INR closely.  Vernard GamblesVeronda Montserrath Madding, PharmD, BCPS  03/05/2015,6:38 AM

## 2015-03-05 NOTE — ED Provider Notes (Signed)
Patient is awake and alert. She is oriented 3. INR is not increasing. She has been accepted to behavioral health by Dr. Jama Flavorsobos.  Crystal OctaveStephen Athalene Kolle, MD 03/05/15 [phone]

## 2015-03-05 NOTE — Progress Notes (Signed)
Recreation Therapy Notes  Animal-Assisted Activity (AAA) Program Checklist/Progress Notes Patient Eligibility Criteria Checklist & Daily Group note for Rec Tx Intervention  Date: 04.14.2016 Time: 2:45pm Location: [address] Dayroom   AAA/T Program Assumption of Risk Form signed by Patient/ or Parent Legal Guardian yes  Patient is free of allergies or sever asthma yes  Patient reports no fear of animals yes  Patient reports no history of cruelty to animals yes  Patient understands his/her participation is voluntary yes  Behavioral Response: Did not attend.   Shantese Raven L Belvie Iribe, LRT/CTRS  Deshauna Cayson L 03/05/2015 4:50 PM 

## 2015-03-05 NOTE — ED Notes (Addendum)
24g IV catheter removed from the pt's right hand. Bleeding controlled. Site is clean, dry and intact.   Pt's speech is still slightly slurred, but pt was arousable from sleep to voice.   Pt asked for pain medication, but this RN explained that she could not have any due to the number of medications she took earlier. Pt verbalized understanding.

## 2015-03-05 NOTE — BHH Counselor (Signed)
CSW attempted to complete PSA but patient sleeping in room. CSW will attempt at a later time.  Samuella BruinKristin Merari Pion, MSW, [employer]CSWA Clinical Social Worker [facility] [phone]
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
## 2015-03-05 NOTE — Tx Team (Signed)
Initial Interdisciplinary Treatment Plan   PATIENT STRESSORS: Financial difficulties Health problems Substance abuse   PATIENT STRENGTHS: Ability for insight Capable of independent living Motivation for treatment/growth   PROBLEM LIST: Problem List/Patient Goals Date to be addressed Date deferred Reason deferred Estimated date of resolution  Depression      Suicide Risk      Substance Abuse                                           DISCHARGE CRITERIA:  Ability to meet basic life and health needs Adequate post-discharge living arrangements Improved stabilization in mood, thinking, and/or behavior Motivation to continue treatment in a less acute level of care Need for constant or close observation no longer present Reduction of life-threatening or endangering symptoms to within safe limits Safe-care adequate arrangements made  PRELIMINARY DISCHARGE PLAN: Attend PHP/IOP Outpatient therapy Return to previous living arrangement  PATIENT/FAMIILY INVOLVEMENT: This treatment plan has been presented to and reviewed with the patient, Crystal Frank.  The patient and family have been given the opportunity to ask questions and make suggestions.  Alfonse Sprucehorne, Daton Szilagyi Brooke 03/05/2015, 11:17 AM

## 2015-03-05 NOTE — Progress Notes (Signed)
Patient ID: Crystal Frank, female   DOB: 10-02-1966, 49 y.o.   MRN: [id number] PER STATE REGULATIONS 482.30  THIS CHART WAS REVIEWED FOR MEDICAL NECESSITY WITH RESPECT TO THE PATIENT'S ADMISSION/DURATION OF STAY.  NEXT REVIEW DATE: 03/09/15  Loura HaltBARBARA Sayre Witherington, RN, BSN CASE MANAGER

## 2015-03-05 NOTE — Progress Notes (Addendum)
ANTICOAGULATION CONSULT NOTE - Follow Up Consult  Pharmacy Consult for Coumadin Indication: hx of PE  No Known Allergies  Patient Measurements: Height: 5\' 11"  (180.3 cm) Weight: 208 lb (94.348 kg) IBW/kg (Calculated) : 70.8 Heparin Dosing Weight:   Vital Signs: Temp: 97.4 F (36.3 C) (04/14 1027) Temp Source: Oral (04/14 1027) BP: 112/78 mmHg (04/14 1028) Pulse Rate: 110 (04/14 1028)  Labs:  Recent Labs  03/04/15 1930 03/04/15 1959 03/05/15 0417  HGB 14.6  --   --   HCT 42.5  --   --   PLT 245  --   --   LABPROT  --  30.7* 29.3*  INR  --  2.92* 2.75*  CREATININE  --  0.81  --     Estimated Creatinine Clearance: 109.9 mL/min (by C-G formula based on Cr of 0.81).   Medications:  Scheduled:  . DULoxetine  20 mg Oral Daily  . warfarin  10 mg Oral ONCE-1800  . Warfarin - Pharmacist Dosing Inpatient   Does not apply q1800   PRN: acetaminophen, alum & mag hydroxide-simeth, magnesium hydroxide, traZODone  Assessment: 49 yo F with hx of PE.  Noted to have overdosed on warfarin 03/03/15 (said 37.5mg ) at that time.   Takes 15mg  on MWF and 12.5mg  on [facility] INR= 2.75mg  today. Goal of Therapy:  INR 2-3    Plan:  Will proceed with 10mg  po x 1 tonight. PT/INR ordered in AM  Loletta SpecterRenz, Lakenya Riendeau Jamieson 03/05/2015,3:34 PM

## 2015-03-05 NOTE — Progress Notes (Signed)
Spoke with poison control, per poison control pt needs daily PT/INR's x72 hours after ingestion.
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
## 2015-03-05 NOTE — [facility] Group Notes (Signed)
[facility] LCSW Group Therapy 03/05/2015  1:15 PM   Type of Therapy: Group Therapy  Participation Level: Did Not Attend. Patient invited to participate but declined.   Samuella BruinKristin Kline Bulthuis, MSW, [employer] Clinical Social Worker [facility] [phone]

## 2015-03-05 NOTE — Progress Notes (Signed)
[facility] Julieanne Cottonina, AC, Dr. Jama Flavorsobos.   CSW spoke with [facility] RN regarding pt's disposition.  Ilean SkillMeghan Tallin Hart, MSW, LCSWA Clinical Social Work, Disposition  03/05/2015 [phone]

## 2015-03-05 NOTE — Progress Notes (Signed)
[facility]  Recommends that Coumadin be held x72 hours after ingestion due to the INR may increase during this time period, NP on call notified, new orders given.

## 2015-03-06 LAB — PROTIME-INR
INR: 1.51 — ABNORMAL HIGH (ref 0.00–1.49)
Prothrombin Time: 18.3 seconds — ABNORMAL HIGH (ref 11.6–15.2)

## 2015-03-06 MED ORDER — DULOXETINE HCL 30 MG PO CPEP
30.0000 mg | ORAL_CAPSULE | Freq: Every day | ORAL | Status: DC
  Administered 2015-03-07 – 2015-03-09 (×3): 30 mg via ORAL
  Filled 2015-03-06 (×6): qty 1

## 2015-03-06 MED ORDER — NICOTINE 21 MG/24HR TD PT24
21.0000 mg | MEDICATED_PATCH | Freq: Every day | TRANSDERMAL | Status: DC
  Administered 2015-03-07 – 2015-03-10 (×3): 21 mg via TRANSDERMAL
  Filled 2015-03-06 (×7): qty 1

## 2015-03-06 MED ORDER — WARFARIN SODIUM 10 MG PO TABS
10.0000 mg | ORAL_TABLET | Freq: Once | ORAL | Status: AC
  Administered 2015-03-06: 10 mg via ORAL
  Filled 2015-03-06: qty 1

## 2015-03-06 MED ORDER — PREGABALIN 25 MG PO CAPS
25.0000 mg | ORAL_CAPSULE | Freq: Two times a day (BID) | ORAL | Status: DC
  Administered 2015-03-06 – 2015-03-07 (×3): 25 mg via ORAL
  Filled 2015-03-06 (×3): qty 1

## 2015-03-06 NOTE — Tx Team (Signed)
Interdisciplinary Treatment Plan Update (Adult) Date: 03/06/2015   Time Reviewed: 9:30 AM  Progress in Treatment: Attending groups: No Participating in groups: No Taking medication as prescribed: Yes Tolerating medication: Yes Family/Significant other contact made: No, patient has declined for CSW to make collateral contact  Patient understands diagnosis: Yes Discussing patient identified problems/goals with staff: Yes Medical problems stabilized or resolved: Yes Denies suicidal/homicidal ideation: Yes, denies  Issues/concerns per patient self-inventory: Yes Other:  New problem(s) identified: N/A  Discharge Plan or Barriers:   4/15: Patient plans to return home to follow up with outpatient services. She is requesting a referral for PCP and Orthopedic Services as well.  Reason for Continuation of Hospitalization:  Depression Anxiety Medication Stabilization   Comments: N/A  Estimated length of stay: 2-3 days  For review of initial/current patient goals, please see plan of care. Patient is a 49 year old female admitted for SI, depression, and an overdose on medications. Patient identifies finances and lack of a strong support system as stressors. [place]. Patient will benefit from crisis stabilization, medication evaluation, group therapy, and psycho education in addition to case management for discharge planning. Patient and CSW reviewed pt's identified goals and treatment plan. Pt verbalized understanding and agreed to treatment plan.    Attendees: Patient:    Family:    Physician: Dr. Jama Flavorsobos; Dr. Dub MikesLugo 03/06/2015 9:30 AM  Nursing: Shelda JakesPatty Duke, Dellia CloudAndrea Thorne, Harold BarbanRonecia Byrd RN 03/06/2015 9:30 AM  Clinical Social Worker: Belenda CruiseKristin Salvatore Shear,  LCSWA 03/06/2015 9:30 AM  Other: Juline PatchQuylle Hodnett, LCSW 03/06/2015 9:30 AM  Other: Leisa LenzValerie Enoch, [facility] Liaison 03/06/2015 9:30 AM  Other: Onnie BoerJennifer Clark, Case Manager 03/06/2015 9:30 AM  Other: Gracy RacerMay Augustin, Shivon  Rankin NP 03/06/2015 9:30 AM  Other: Trula SladeHeather Smart, LCSWA 03/06/2015 9:30 AM  Other:    Other:       Scribe for Treatment Team:  Samuella BruinKristin Mahir Prabhakar, MSW, [employer]CS[employer] [phone]

## 2015-03-06 NOTE — Progress Notes (Signed)
[facility] MD Progress Note  03/06/2015 5:53 PM Crystal Frank  MRN:  [id number] Subjective:  Patient reports she is feeling a little better. She states that she is relieved she did not die from her overdose, which she describes as impulsive. She states " it is very out of character for me, I really don't know how I could have done something like this. Really, it's kind of scary". Denies medication side effects. Describes some chronic pain, and states that Lyrica has worked well for her in the past. Did not tolerate Neurontin, but Lyrica did not cause side effects. Describes some vertigo/dizziness, which she states she has had intermittently for several months. Denies falls. Objective : I have discussed case with treatment team, and have met with patient. Patient cal, cooperative upon approach.  Some group participation. Describing improved mood and a sense of regret regarding recent overdose. Has denied any ongoing SI, and has not presented with any self injurious ideations. Tolerating medications well. Pharmacy managing Coumadin- PT/INR as per protocol. We discussed medication side effects, risks. Patient aware of potential increased risk of bleeding, particularly GI bleed, on antidepressant and coumadin. As discussed with  Nursing, PT/INR lower today after Coumadin held related to poison control center recommendation of holding dose S/P overdose/ concern of PT/INR increasing . As lower, pharmacist to make necessary adjustments to therapy.  Principal Problem: Suicide attempt by multiple drug overdose Diagnosis:   Patient Active Problem List   Diagnosis Date Noted  . Suicide attempt by multiple drug overdose [T50.902A] 03/05/2015  . Adjustment disorder with mixed anxiety and depressed mood [F43.23] 03/05/2015  . MDD (major depressive disorder), single episode, severe [F32.2] 03/05/2015  . Chronic pain [G89.29] 09/27/2012  . 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 Clotting disorder  [D68.9] 09/26/2012  . Osteomyelitis, L tibia [M86.9] 09/26/2012  . Lupus [M32.9]   . Encounter for long-term (current) use of anticoagulants [Z79.01] 04/19/2012  . 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 Pulmonary embolism [I26.99] 04/14/2012   Total Time spent with patient: 25 minutes    Past Medical History:  Past Medical History  Diagnosis Date  . Pulmonary embolism 2008    seen at [facility]--right lung  . Lupus   . Pleurisy   . 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 Arthritis   . Osteomyelitis   . Depression   . Anxiety     Past Surgical History  Procedure Laterality Date  . Multiple orthopeadic surgeries      both ankles  . Ankle fusions      bilat fusions, multiple  . Hardware removal  09/25/2012    Procedure: HARDWARE REMOVAL;  Surgeon: Rozanna Box, MD;  Location: [place];  Service: Orthopedics;  Laterality: Left;  HARDWARE REMOVAL LEFT ANKLE   . Tibia osteotomy  09/25/2012    Procedure: TIBIAL OSTEOTOMY;  Surgeon: Rozanna Box, MD;  Location: [place];  Service: Orthopedics;;  partial excision of tibia, placement of non-biodegradeable drug delivery device, left tibia  . Ankle arthrodesis  10/23/2012  . Ankle fusion  10/23/2012    Procedure: ARTHRODESIS ANKLE;  Surgeon: Rozanna Box, MD;  Location: [place];  Service: Orthopedics;  Laterality: Left;  REMOVAL RETAINED ANTIBIOTIC NAIL LEFT TIBIA/TIBIO TALAR FUSION   Family History:  Family History  Problem Relation Age of Onset  . Clotting disorder Paternal Aunt   . Cancer Maternal Uncle    Social History:  History  Alcohol Use  . Yes    Comment: ocassionally     History  Drug Use  . Yes  . Special: Oxycodone, Cocaine, Marijuana    History   Social History  . Marital Status: Single    Spouse Name: N/A  . Number of Children: N/A  . Years of Education: N/A   Occupational History  . diabled    Social History Main Topics  . Smoking status:  Current Every Day Smoker -- 0.50 packs/day for 27 years    Types: Cigarettes  . Smokeless tobacco: Never Used  . Alcohol Use: Yes     Comment: ocassionally  . Drug Use: Yes    Special: Oxycodone, Cocaine, Marijuana  . Sexual Activity: Yes   Other Topics Concern  . None   Social History Narrative   Additional History:    Sleep: Good  Appetite:  Fair   Assessment:   Musculoskeletal: Strength & Muscle Tone: within normal limits Gait & Station:  Slowed  Patient leans: N/A   Psychiatric Specialty Exam: Physical Exam  Review of Systems  Constitutional: Negative for fever and chills.  Respiratory: Negative for cough, hemoptysis and shortness of breath.   Cardiovascular: Negative for chest pain.  Gastrointestinal: Negative for vomiting, abdominal pain, diarrhea and melena.  Genitourinary: Negative for hematuria.  Skin: Negative for rash.  Neurological: Positive for dizziness. Negative for seizures and headaches.  Psychiatric/Behavioral: Positive for depression.    Blood pressure 112/78, pulse 110, temperature 97.4 F (36.3 C), temperature source Oral, resp. rate 20, height $RemoveBe'5\' 11"'KrAJVhqHd$  (1.803 m), weight 208 lb (94.348 kg), last menstrual period 02/18/2015.Body mass index is 29.02 kg/(m^2).  General Appearance: Fairly Groomed  Engineer, water::  Good  Speech:  Normal Rate  Volume:  Normal  Mood:  less depressed  Affect:  reactive, smiles at times appropriately  Thought Process:  Goal Directed and Linear  Orientation:  Other:  fully alert and attentive   Thought Content:  Rumination and  denies any hallucinations, no delusions, not internally preoccupied \  Suicidal Thoughts:  No at this time denies any thoughts of hurting self and  contracts for safety on unit   Homicidal Thoughts:  No  Memory:  Recent and Remote grossly intact  Judgement:  Fair  Insight:  Fair  Psychomotor Activity:  Decreased  Concentration:  Good  Recall:  NA  Fund of Knowledge:Good  Language: Good   Akathisia:  Negative  Handed:  Right  AIMS (if indicated):     Assets:  Communication Skills Desire for Improvement Resilience  ADL's:  Impaired  Cognition: WNL  Sleep:  Number of Hours: 6.5     Current Medications: Current Facility-Administered Medications  Medication Dose Route Frequency Provider Last Rate Last Dose  . acetaminophen (TYLENOL) tablet 650 mg  650 mg Oral Q6H PRN Shuvon B Rankin, NP      . alum & mag hydroxide-simeth (MAALOX/MYLANTA) 200-200-20 MG/5ML suspension 30 mL  30 mL Oral Q4H PRN Shuvon B Rankin, NP      . DULoxetine (CYMBALTA) DR capsule 20 mg  20 mg Oral Daily Jenne Campus, MD   20 mg at 03/06/15 0813  . magnesium hydroxide (MILK OF MAGNESIA) suspension 30 mL  30 mL Oral Daily PRN Shuvon B Rankin, NP      . traZODone (DESYREL) tablet 50 mg  50 mg Oral QHS PRN Shuvon B Rankin, NP      . warfarin (COUMADIN) tablet 10 mg  10 mg Oral ONCE-1800 Fernando A Cobos,  MD      . Warfarin - Pharmacist Dosing Inpatient   Does not apply q1800 Shuvon B Rankin, NP        Lab Results:  Results for orders placed or performed during the hospital encounter of 03/05/15 (from the past 48 hour(s))  Protime-INR     Status: Abnormal   Collection Time: 03/06/15  6:23 AM  Result Value Ref Range   Prothrombin Time 18.3 (H) 11.6 - 15.2 seconds   INR 1.51 (H) 0.00 - 1.49    Comment: Performed at [facility]    Physical Findings: AIMS: Facial and Oral Movements Muscles of Facial Expression: None, normal Lips and Perioral Area: None, normal Jaw: None, normal Tongue: None, normal,Extremity Movements Upper (arms, wrists, hands, fingers): None, normal Lower (legs, knees, ankles, toes): None, normal, Trunk Movements Neck, shoulders, hips: None, normal, Overall Severity Severity of abnormal movements (highest score from questions above): None, normal Incapacitation due to abnormal movements: None, normal Patient's awareness of abnormal movements (rate only  patient's report): No Awareness, Dental Status Current problems with teeth and/or dentures?: No Does patient usually wear dentures?: No  CIWA:  CIWA-Ar Total: 0 COWS:  COWS Total Score: 3  Assessment- at this time patient reporting improvement, less depression and a sense of regret and concern about her recent impulsive suicide attempt by overdosing. She presents fairly groomed, and although denies severe depression, continues to present somewhat labile, emotionally frail. Tolerating medications well, and has had no side effects. No bleeding reported .   Treatment Plan Summary: Daily contact with patient to assess and evaluate symptoms and progress in treatment, Medication management, Plan continue inpatient treatment and continue medications as below  Increase Cymbalta to 30 mgrs QDAY  Start Lyrica at 25 mgrs BID initially Pharmacy managing Coumadin  Medical Decision Making:  Established Problem, Stable/Improving (1), Review of Psycho-Social Stressors (1), Review or order clinical lab tests (1) and Review of Medication Regimen & Side Effects (2)     COBOS, FERNANDO 03/06/2015, 5:53 PM

## 2015-03-06 NOTE — Clinical Social Work Note (Signed)
Referral faxed to [facility] to review for medication management appointment.  Samuella BruinKristin Kaoru Benda, MSW, [employer]CSWA Clinical Social Worker [facility] [phone]

## 2015-03-06 NOTE — Progress Notes (Signed)
Psychoeducational Group Note  Date:  03/06/2015 Time:  2227  Group Topic/Focus:  Wrap-Up Group:   The focus of this group is to help patients review their daily goal of treatment and discuss progress on daily workbooks.  Participation Level: Did Not Attend  Participation Quality:  Not Applicable  Affect:  Not Applicable  Cognitive:  Not Applicable  Insight:  Not Applicable  Engagement in Group: Not Applicable  Additional Comments:  The patient did not attend group.   [name], [name] 03/06/2015, 10:27 PM

## 2015-03-06 NOTE — [facility] Group Notes (Signed)
[facility] LCSW Aftercare Discharge Planning Group Note  03/06/2015  8:45 AM  Participation Quality: Did Not Attend. Patient invited to participate but declined.  Jessicalynn Deshong, MSW, LCSWA Clinical Social Worker  [facility] [phone]   

## 2015-03-06 NOTE — [facility] Group Notes (Signed)
[facility] LCSW Group Therapy 03/06/2015  1:15 PM   Type of Therapy: Group Therapy  Participation Level: Did Not Attend. Patient participated but declined.   Samuella BruinKristin Airen Dales, MSW, [employer]CSWA Clinical Social Worker [facility] [phone]

## 2015-03-06 NOTE — Progress Notes (Signed)
D: Patient has anxious affect and depressed mood. She declined completing the self inventory sheet. Writer observed that the patient has been in the bed the majority of the day, besides attending meals and coming to the medication window. This evening, the patient is now visible in the milieu with minimal interaction with others. In adherence with medication regimen.  A: Support and encouragement provided to patient. Administered medications per ordering MD. Monitor Q15 minute checks for safety.  R: Patient receptive. Denies SI/HI and AVH. Patient remains safe on the unit.

## 2015-03-06 NOTE — [facility] Suicide Risk Assessment (Signed)
[facility] INPATIENT:  Family/Significant Other Suicide Prevention Education  Suicide Prevention Education:  Patient Refusal for Family/Significant Other Suicide Prevention Education: The patient Crystal Frank has refused to provide written consent for family/significant other to be provided Family/Significant Other Suicide Prevention Education during admission and/or prior to discharge.  Physician notified. SPE reviewed with patient and brochure provided. Patient encouraged to return to hospital if having suicidal thoughts, patient verbalized his/her understanding and has no further questions at this time.   [name], West CarboKristin L 03/06/2015, 11:34 AM

## 2015-03-06 NOTE — Progress Notes (Signed)
ANTICOAGULATION CONSULT NOTE - Follow Up Consult  Pharmacy Consult for Coumadin Indication: h/o PE  No Known Allergies  Patient Measurements: Height: 5\' 11"  (180.3 cm) Weight: 208 lb (94.348 kg) IBW/kg (Calculated) : 70.8   Labs:  Recent Labs  03/04/15 1930 03/04/15 1959 03/05/15 0417 03/06/15 0623  HGB 14.6  --   --   --   HCT 42.5  --   --   --   PLT 245  --   --   --   LABPROT  --  30.7* 29.3* 18.3*  INR  --  2.92* 2.75* 1.51*  CREATININE  --  0.81  --   --     Estimated Creatinine Clearance: 109.9 mL/min (by C-G formula based on Cr of 0.81).   Medications:  Scheduled:  . DULoxetine  20 mg Oral Daily  . Warfarin - Pharmacist Dosing Inpatient   Does not apply q1800    Assessment: INR below goal of 2-3.  RN spoke with posion control regarding potential overdose on coumadin.  INR decreasing to below goal after  holding one dose.  Now 48 hours post dose .  Will need to continue coumadin to prevent further decrease of INR.  Goal of Therapy:  INR 2-3    Plan:  Coumadin 10 mg po x 1 today at 1800 PT/INR in am    [name], Edoardo Laforte Marie 03/06/2015,9:02 AM

## 2015-03-06 NOTE — Progress Notes (Signed)
D   Pt spent the shift asleep in bed   It was reported that pt gait is steadier and she was able to take nourishment at the evening meal   Pt was observed in bed resting with eyes closed  No complaints voiced A   Verbal support given     Q 15 min checks R   Pt safe at present
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
## 2015-03-06 NOTE — Progress Notes (Signed)
Pt did not attend karaoke. Instead pt stayed on the hall and slept  

## 2015-03-06 NOTE — Progress Notes (Signed)
Pt came to the nurses station around 3am requesting something for congestion   Pt received a cough drop and was instructed to discuss with the doctor what else she could have when she was seen today   Pt was agreeable to same  She was steady on her feet and was alert

## 2015-03-07 ENCOUNTER — Encounter (HOSPITAL_COMMUNITY): Payer: Self-pay | Admitting: Registered Nurse

## 2015-03-07 LAB — PROTIME-INR
INR: 1.11 (ref 0.00–1.49)
Prothrombin Time: 14.4 seconds (ref 11.6–15.2)

## 2015-03-07 MED ORDER — IBUPROFEN 600 MG PO TABS: 600.0000 mg | ORAL_TABLET | ORAL | Status: DC | PRN

## 2015-03-07 MED ORDER — PREGABALIN 50 MG PO CAPS
50.0000 mg | ORAL_CAPSULE | Freq: Two times a day (BID) | ORAL | Status: DC
  Administered 2015-03-07 – 2015-03-08 (×2): 50 mg via ORAL
  Filled 2015-03-07 (×2): qty 1

## 2015-03-07 MED ORDER — TRAZODONE HCL 100 MG PO TABS
100.0000 mg | ORAL_TABLET | Freq: Every evening | ORAL | Status: DC | PRN
  Administered 2015-03-07 – 2015-03-09 (×3): 100 mg via ORAL
  Filled 2015-03-07: qty 1
  Filled 2015-03-07: qty 14
  Filled 2015-03-07 (×2): qty 1

## 2015-03-07 MED ORDER — WARFARIN SODIUM 7.5 MG PO TABS
15.0000 mg | ORAL_TABLET | Freq: Once | ORAL | Status: AC
  Administered 2015-03-07: 15 mg via ORAL
  Filled 2015-03-07: qty 2

## 2015-03-07 MED ORDER — ACETAMINOPHEN 500 MG PO TABS
1000.0000 mg | ORAL_TABLET | Freq: Four times a day (QID) | ORAL | Status: DC | PRN
  Administered 2015-03-07 – 2015-03-10 (×6): 1000 mg via ORAL
  Filled 2015-03-07 (×6): qty 2

## 2015-03-07 MED ORDER — DM-GUAIFENESIN ER 30-600 MG PO TB12
1.0000 | ORAL_TABLET | Freq: Two times a day (BID) | ORAL | Status: DC
  Administered 2015-03-07 – 2015-03-10 (×6): 1 via ORAL
  Filled 2015-03-07 (×12): qty 1

## 2015-03-07 MED ORDER — ALBUTEROL SULFATE HFA 108 (90 BASE) MCG/ACT IN AERS: 2.0000 | INHALATION_SPRAY | Freq: Four times a day (QID) | RESPIRATORY_TRACT | Status: DC | PRN

## 2015-03-07 NOTE — [facility] Group Notes (Signed)
[facility] Group Notes:  (Clinical Social Work)  03/07/2015     10-11AM  Summary of Progress/Problems:   The main focus of today's process group was to learn how to use a decisional balance exercise to move forward in the Stages of Change, which were described and discussed.  Motivational Interviewing and a worksheet were utilized to help patients explore in depth the perceived benefits and costs of a self-sabotaging behavior, as well as the  benefits and costs of replacing that with a healthy coping mechanism.   The patient expressed herself insightfully throughout group, stated patience is a healthy coping skill she uses, but lately she has been unhealthily exploding at people.  She is in the planning stage of change.  Type of Therapy:  Group Therapy - Process   Participation Level:  Active  Participation Quality:  Attentive, Sharing and Supportive  Affect:  Blunted  Cognitive:  Appropriate and Oriented  Insight:  Engaged  Engagement in Therapy:  Engaged  Modes of Intervention:  Education, Motivational Interviewing  Ambrose MantleMareida Grossman-Orr, LCSW 03/07/2015, 11:58 AM

## 2015-03-07 NOTE — Progress Notes (Signed)
Adult Psychoeducational Group Note  Date:  03/07/2015 Time: 09:00 am  Group Topic/Focus:  Orientation:   The focus of this group is to educate the patient on the purpose and policies of crisis stabilization and provide a format to answer questions about their admission.  The group details unit policies and expectations of patients while admitted.  Participation Level:  Did Not Attend  Participation Quality:  n/a  Affect: n/a  Cognitive: n/a  Insight: n/a  Engagement in Group:  n/a  Modes of Intervention:  Discussion, Education, Exploration, Orientation and Support  Additional Comments: Pt did not attend group. Pt in bed asleep.   Aurora Maskwyman, Sequoyah Ramone E 03/07/2015, 9:28 AM

## 2015-03-07 NOTE — Progress Notes (Signed)
[facility] Group Notes:  (Nursing/MHT/Case Management/Adjunct)  Date:  03/07/2015  Time:  9:51 PM  Type of Therapy:  Psychoeducational Skills  Participation Level:  Active  Participation Quality:  Attentive  Affect:  Flat  Cognitive:  Appropriate  Insight:  Limited  Engagement in Group:  Limited  Modes of Intervention:  Education  Summary of Progress/Problems: The patient shared with the group that she had a good day since she felt "jovial". The patient also mentioned that she felt tired. As a theme for the day, her coping skill is to sit on a boat in the middle of a pond.   [name], Crystal Frank 03/07/2015, 9:51 PM

## 2015-03-07 NOTE — Progress Notes (Signed)
[facility]  03/07/2015 3:15 PM Crystal ButtsJennifer L Frank  MRN:  [id number]   Subjective:  Patient states that she is doing better.  "Things are better.  I think the night everything happened; I wouldn't say I it was the bottom of all times; I think I said just what the hell; I'm not normally like that;  I love my life to much and the people around me to kill myself."   Complains of pain in both ankles and wants referral to podiatrist once discharged; also complains of cough and shortness of breath related to URI.   Pharmacy continues to managing Coumadin- PT/INR as per protocol. At this time patient denies suicidal/homicidal ideation, psychosis, and paranoia.    Objective : Patient seen, chart reviewed, and discussed with staff.  Patient tolerating medications without adverse reaction.  States that Lyrica is not working and in the past was taking a higher dose.   Patient is participating in group sessions   Principal Problem: Suicide attempt by multiple drug overdose Diagnosis:   Patient Active Problem List   Diagnosis Date Noted  . Suicide attempt by multiple drug overdose [T50.902A] 03/05/2015  . Adjustment disorder with mixed anxiety and depressed mood [F43.23] 03/05/2015  . MDD (major depressive disorder), single episode, severe [F32.2] 03/05/2015  . Chronic pain [G89.29] 09/27/2012  . 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 Clotting disorder [D68.9] 09/26/2012  . Osteomyelitis, L tibia [M86.9] 09/26/2012  . Lupus [M32.9]   . Encounter for long-term (current) use of anticoagulants [Z79.01] 04/19/2012  . 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 Pulmonary embolism [I26.99] 04/14/2012   Total Time spent with patient: 25 minutes    Past Medical History:  Past Medical History  Diagnosis Date  . Pulmonary embolism 2008    seen at [facility]--right lung  . Lupus   . Pleurisy   . 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 Nonunion of L tibiotalar fusion  09/26/2012  . Arthritis   . Osteomyelitis   . Depression   . Anxiety     Past Surgical History  Procedure Laterality Date  . Multiple orthopeadic surgeries      both ankles  . Ankle fusions      bilat fusions, multiple  . Hardware removal  09/25/2012    Procedure: HARDWARE REMOVAL;  Surgeon: Budd PalmerMichael H Handy, MD;  [facility];  Service: Orthopedics;  Laterality: Left;  HARDWARE REMOVAL LEFT ANKLE   . Tibia osteotomy  09/25/2012    Procedure: TIBIAL OSTEOTOMY;  Surgeon: Budd PalmerMichael H Handy, MD;  [facility];  Service: Orthopedics;;  partial excision of tibia, placement of non-biodegradeable drug delivery device, left tibia  . Ankle arthrodesis  10/23/2012  . Ankle fusion  10/23/2012    Procedure: ARTHRODESIS ANKLE;  Surgeon: Budd PalmerMichael H Handy, MD;  [facility];  Service: Orthopedics;  Laterality: Left;  REMOVAL RETAINED ANTIBIOTIC NAIL LEFT TIBIA/TIBIO TALAR FUSION   Family History:  Family History  Problem Relation Age of Onset  . Clotting disorder Paternal Aunt   . Cancer Maternal Uncle    Social History:  History  Alcohol Use  . Yes    Comment: ocassionally     History  Drug Use  . Yes  . Special: Oxycodone, Cocaine, Marijuana    History   Social History  . Marital Status: Single    Spouse Name: N/A  . Number of Children: N/A  . Years of Education: N/A   Occupational History  .  diabled    Social History Main Topics  . Smoking status: Current Every Day Smoker -- 0.50 packs/day for 27 years    Types: Cigarettes  . Smokeless tobacco: Never Used  . Alcohol Use: Yes     Comment: ocassionally  . Drug Use: Yes    Special: Oxycodone, Cocaine, Marijuana  . Sexual Activity: Yes   Other Topics Concern  . None   Social History Narrative   Additional History:    Sleep: Good  Appetite:  Fair, improving   Assessment:   Musculoskeletal: Strength & Muscle Tone: within normal limits Gait & Station:  Slowed  Patient leans: N/A   Psychiatric Specialty  Exam: Physical Exam  Psychiatric: Her speech is normal and behavior is normal. She exhibits a depressed mood.    Review of Systems  Respiratory: Positive for cough, sputum production, shortness of breath and wheezing.   Cardiovascular: Negative for chest pain and palpitations.  Musculoskeletal: Positive for joint pain.  Psychiatric/Behavioral: Positive for depression and substance abuse. Negative for hallucinations and memory loss. Suicidal ideas: Denies at this time. The patient is nervous/anxious and has insomnia.     Blood pressure 127/94, pulse 97, temperature 97.6 F (36.4 C), temperature source Oral, resp. rate 17, height  (1.803 m), weight 94.348 kg (208 lb), last menstrual period 02/18/2015.Body mass index is 29.02 kg/(m^2).  General Appearance: Fairly Groomed  Patent attorney::  Good  Speech:  Normal Rate  Volume:  Normal  Mood:  Depressed, improving  Affect:  reactive, smiles at times appropriately  Thought Process:  Goal Directed and Linear  Orientation:  Other:  fully alert and attentive   Thought Content:  Rumination and  denies any hallucinations, no delusions, not internally preoccupied \  Suicidal Thoughts:  Denies at this time   Homicidal Thoughts:  No  Memory:  Recent and Remote grossly intact  Judgement:  Fair  Insight:  Fair  Psychomotor Activity:  Decreased  Concentration:  Good  Recall:  NA  Fund of Knowledge:Good  Language: Good  Akathisia:  Negative  Handed:  Right  AIMS (if indicated):     Assets:  Communication Skills Desire for Improvement Resilience  ADL's:  Impaired  Cognition: WNL  Sleep:  Number of Hours: 6.5     Current Medications: Current Facility-Administered Medications  Medication Dose Route Frequency Provider Last Rate Last Dose  . acetaminophen (TYLENOL) tablet 650 mg  650 mg Oral Q6H PRN Shuvon B Rankin, NP   650 mg at 03/07/15 1120  . albuterol (PROVENTIL HFA;VENTOLIN HFA) 108 (90 BASE) MCG/ACT inhaler 2 puff  2 puff Inhalation  Q6H PRN Shuvon B Rankin, NP      . alum & mag hydroxide-simeth (MAALOX/MYLANTA) 200-200-20 MG/5ML suspension 30 mL  30 mL Oral Q4H PRN Shuvon B Rankin, NP      . dextromethorphan-guaiFENesin (MUCINEX DM) 30-600 MG per 12 hr tablet 1 tablet  1 tablet Oral BID Shuvon B Rankin, NP      . DULoxetine (CYMBALTA) DR capsule 30 mg  30 mg Oral Daily Craige Cotta, MD   30 mg at 03/07/15 0732  . magnesium hydroxide (MILK OF MAGNESIA) suspension 30 mL  30 mL Oral Daily PRN Shuvon B Rankin, NP      . nicotine (NICODERM CQ - dosed in mg/24 hours) patch 21 mg  21 mg Transdermal Daily Craige Cotta, MD   21 mg at 03/07/15 0732  . pregabalin (LYRICA) capsule 50 mg  50 mg Oral BID Shuvon B  Rankin, NP      . traZODone (DESYREL) tablet 50 mg  50 mg Oral QHS PRN Shuvon B Rankin, NP   50 mg at 03/06/15 2143  . warfarin (COUMADIN) tablet 15 mg  15 mg Oral ONCE-1800 Craige Cotta, MD      . Warfarin - Pharmacist Dosing Inpatient   Does not apply q1800 Shuvon B Rankin, NP        Lab Results:  Results for orders placed or performed during the hospital encounter of 03/05/15 (from the past 48 hour(s))  Protime-INR     Status: Abnormal   Collection Time: 03/06/15  6:23 AM  Result Value Ref Range   Prothrombin Time 18.3 (H) 11.6 - 15.2 seconds   INR 1.51 (H) 0.00 - 1.49    Comment: Performed at [facility]  Protime-INR     Status: None   Collection Time: 03/07/15  6:30 AM  Result Value Ref Range   Prothrombin Time 14.4 11.6 - 15.2 seconds   INR 1.11 0.00 - 1.49    Comment: Performed at [facility]    Physical Findings: AIMS: Facial and Oral Movements Muscles of Facial Expression: None, normal Lips and Perioral Area: None, normal Jaw: None, normal Tongue: None, normal,Extremity Movements Upper (arms, wrists, hands, fingers): None, normal Lower (legs, knees, ankles, toes): None, normal, Trunk Movements Neck, shoulders, hips: None, normal, Overall Severity Severity  of abnormal movements (highest score from questions above): None, normal Incapacitation due to abnormal movements: None, normal Patient's awareness of abnormal movements (rate only patient's report): No Awareness, Dental Status Current problems with teeth and/or dentures?: No Does patient usually wear dentures?: No  CIWA:  CIWA-Ar Total: 0 COWS:  COWS Total Score: 3  Assessment- at this time patient reporting improvement, less depression and a sense of regret and concern about her recent impulsive suicide attempt by overdosing. She presents fairly groomed, and although denies severe depression, continues to present somewhat labile, emotionally frail. Tolerating medications well, and has had no side effects. No bleeding reported .   Treatment Plan Summary: Daily contact with patient to assess and evaluate symptoms and progress in treatment, Medication management, Plan continue inpatient treatment and continue medications as below  Increase Cymbalta to 30 mgrs QDAY  Start Lyrica at 25 mgrs BID initially Pharmacy managing Coumadin  Will continue with current treatment plan.  Increased Lyrica to 50 mg bid; Added Albuterol inhaler and Mucinex DM Medical Decision Making:  Established Problem, Stable/Improving (1), Review of Psycho-Social Stressors (1), Review or order clinical lab tests (1) and Review of Medication Regimen & Side Effects (2)   Rankin, Shuvon, FNP-BC 03/07/2015, 3:15 PM   I agreed with the findings, treatment and disposition plan of this patient. Kathryne Sharper, MD

## 2015-03-07 NOTE — Progress Notes (Signed)
ANTICOAGULATION CONSULT NOTE - Follow Up Consult  Pharmacy Consult for Coumadin Indication:h/o pe/dvt  No Known Allergies  Patient Measurements: Height: 5\' 11"  (180.3 cm) Weight: 208 lb (94.348 kg) IBW/kg (Calculated) : 70.8  Vital Signs: Temp: 97.6 F (36.4 C) (04/16 0600) BP: 127/94 mmHg (04/16 0601) Pulse Rate: 97 (04/16 0601)  Labs:  Recent Labs  03/04/15 1930  03/04/15 1959 03/05/15 0417 03/06/15 0623 03/07/15 0630  HGB 14.6  --   --   --   --   --   HCT 42.5  --   --   --   --   --   PLT 245  --   --   --   --   --   LABPROT  --   < > 30.7* 29.3* 18.3* 14.4  INR  --   < > 2.92* 2.75* 1.51* 1.11  CREATININE  --   --  0.81  --   --   --   < > = values in this interval not displayed.  Estimated Creatinine Clearance: 109.9 mL/min (by C-G formula based on Cr of 0.81).   Medications:  Scheduled:  . DULoxetine  30 mg Oral Daily  . nicotine  21 mg Transdermal Daily  . pregabalin  25 mg Oral BID  . warfarin  15 mg Oral ONCE-1800  . Warfarin - Pharmacist Dosing Inpatient   Does not apply q1800    Assessment: INR well below goal  Goal of Therapy:  INR 2-3    Plan:  Coumadin 15 mg x 1 INR in am   Crystal Frank, Crystal Frank Crystal Frank 03/07/2015,11:29 AM

## 2015-03-07 NOTE — Progress Notes (Signed)
D   Pt is pleasant and cooperative   She attends groups and is active   She interacts appropriately with others and is compliant with treatment   She endorses depression but expresses remorse for her overdose A   Verbal support given   Medications administered and effectiveness monitored   Q 15 min checks R   Pt safe at present

## 2015-03-07 NOTE — Progress Notes (Signed)
Patient ID: Crystal ButtsJennifer L Frank, female   DOB: Aug 28, 1966, 49 y.o.   MRN: [id number]  Pt currently presents with a animated affect and anxious behavior. Per self inventory, pt rates depression at a 1, hopelessness 0 and anxiety 5. Pt's daily goal is "remaining positive" and they intend to do so by "interact with others and remain jovial." Pt reports poor sleep, normal energy level and a good appetite.   Pt provided with medications per providers orders. Pt's labs and vitals were monitored throughout the day. Pt supported emotionally and encouraged to express concerns and questions. Pt educated on medications and alternative pain therapy.  Pt's safety ensured with 15 minute and environmental checks. Pt currently denies SI/HI and A/V hallucinations. Pt verbally agrees to seek staff if SI/HI or A/VH occurs and to consult with staff before acting on these thoughts.

## 2015-03-08 DIAGNOSIS — Y9329 Activity, other involving ice and snow: Secondary | ICD-10-CM

## 2015-03-08 DIAGNOSIS — T50902A Poisoning by unspecified drugs, medicaments and biological substances, intentional self-harm, initial encounter: Secondary | ICD-10-CM

## 2015-03-08 LAB — PROTIME-INR
INR: 1.21 (ref 0.00–1.49)
Prothrombin Time: 15.4 seconds — ABNORMAL HIGH (ref 11.6–15.2)

## 2015-03-08 MED ORDER — PREGABALIN 100 MG PO CAPS
100.0000 mg | ORAL_CAPSULE | Freq: Two times a day (BID) | ORAL | Status: DC
  Administered 2015-03-08 – 2015-03-10 (×4): 100 mg via ORAL
  Filled 2015-03-08 (×5): qty 1

## 2015-03-08 MED ORDER — WARFARIN SODIUM 7.5 MG PO TABS
15.0000 mg | ORAL_TABLET | Freq: Once | ORAL | Status: AC
  Administered 2015-03-08: 15 mg via ORAL
  Filled 2015-03-08: qty 2

## 2015-03-08 NOTE — Progress Notes (Signed)
[facility] Group Notes:  (Nursing/MHT/Case Management/Adjunct)  Date:  03/08/2015  Time:  9:43 PM  Type of Therapy:  Psychoeducational Skills  Participation Level:  Active  Participation Quality:  Appropriate  Affect:  Appropriate  Cognitive:  Appropriate  Insight:  Good  Engagement in Group:  Engaged  Modes of Intervention:  Education  Summary of Progress/Problems: Patient states that she had a good day overall and that she had a good talk with the physician'Frank assistant. She states that the staff spoke with her about her diagnosis which has brought her a great deal of relief. As a goal for tomorrow, she plans to prepare herself for discharge.   Crystal Frank, Crystal Frank 03/08/2015, 9:43 PM

## 2015-03-08 NOTE — Progress Notes (Signed)
D: Patient is alert and oriented. Pts mood and affect is pleasant/euthymic and appropriate to circumstance. Pt denies SI/HI and AVH. Pt denies depression and hopelessness. Pt rates anxiety 2/10. Pt complains of chronic neck pain 8/10 this morning with some relief from PRN medication. Pt reports her goal for the day is "remain positive and move forward in a positive functional productive manner." Pt is attending unit groups. Pt's 1800 'war[facility]in pharmacist dosing[facility], [facility], [facility] and as seen in [facility] PharmD notes, ok to give 15mg  Coumadin at 1800. A: Active listening by RN. Encouragement/Support provided to pt. Medication education reviewed with pt. PRN medication administered for pain per providers orders (See MAR). Scheduled medications administered per providers orders (See MAR). 15 minute checks continued per protocol for patient safety.  R: Patient cooperative and receptive to nursing interventions. Pt remains safe.

## 2015-03-08 NOTE — [facility] Group Notes (Signed)
[facility] Group Notes:  (Nursing/MHT/Case Management/Adjunct)  Date:  03/08/2015  Time:  0930am  Type of Therapy:  Nurse Education  Participation Level:  Active  Participation Quality:  Appropriate and Attentive  Affect:  Appropriate  Cognitive:  Alert and Appropriate  Insight:  Appropriate and Good  Engagement in Group:  Engaged  Modes of Intervention:  Discussion, Education and Support  Summary of Progress/Problems: Patient attended group, remained engaged and responded appropriately when prompted.   Crystal Frank, Crystal Frank 03/08/2015, 10:22 AM

## 2015-03-08 NOTE — Plan of Care (Signed)
Problem: Alteration in mood Goal: LTG-Patient reports reduction in suicidal thoughts (Patient reports reduction in suicidal thoughts and is able to verbalize a safety plan for whenever patient is feeling suicidal)  Outcome: Progressing Patient denies having any suicidal thoughts today.  Problem: Ineffective individual coping Goal: STG: Patient will remain free from self harm Outcome: Progressing Patient remains free from self harm. 15 minute checks continued per protocol for patient safety.      

## 2015-03-08 NOTE — [facility] Group Notes (Signed)
[facility] Group Notes:  (Clinical Social Work)  03/08/2015  10:00-11:00AM  Summary of Progress/Problems:   The main focus of today's process group was to   1)  discuss the importance of adding supports  2)  define health supports versus unhealthy supports  3)  identify the patient's current unhealthy supports and plan how to handle them  4)  Identify the patient's current healthy supports and plan what to add.  An emphasis was placed on using counselor, doctor, therapy groups, 12-step groups, and problem-specific support groups to expand supports.    The patient expressed full comprehension of the concepts presented, and agreed that there is a need to add more supports.  The patient stated she has many friends who are supportive and who "don't take sides so I call them [place]  She states that she herself is not always a good support, because she does not always reach out to the supports that are available.  Among her unhealthy supports are her sister and mother, who hate the relationship she is in, which she states is "volatile."  Type of Therapy:  Process Group with Motivational Interviewing  Participation Level:  Active  Participation Quality:  Attentive, Sharing and Supportive  Affect:  Anxious and Blunted  Cognitive:  Alert, Appropriate and Oriented  Insight:  Engaged  Engagement in Therapy:  Engaged  Modes of Intervention:   Education, Support and Processing, Activity  Pilgrim's PrideMareida Grossman-Orr, LCSW 03/08/2015, 12:15pm

## 2015-03-08 NOTE — Progress Notes (Signed)
Patient ID: Crystal Frank, female   DOB: 07-Jun-1966, 49 y.o.   MRN: [id number] [facility] MD Progress Note  03/08/2015 2:51 PM Crystal Frank  MRN:  [id number]   Subjective:  Patient states that she is doing better.  "Sometimes I just need to let go of the need to help others.  Think of me first." Pharmacy continues to managing Coumadin- PT/INR as per protocol. At this time patient denies suicidal/homicidal ideation, psychosis, and paranoia.    Objective : Patient seen, chart reviewed, and discussed with staff.  Patient tolerating medications without adverse reaction.  States that Lyrica is not working and in the past was taking a higher dose.   Patient is participating in group sessions   Principal Problem: Suicide attempt by multiple drug overdose Diagnosis:   Patient Active Problem List   Diagnosis Date Noted  . Suicide attempt by multiple drug overdose [T50.902A] 03/05/2015  . Adjustment disorder with mixed anxiety and depressed mood [F43.23] 03/05/2015  . MDD (major depressive disorder), single episode, severe [F32.2] 03/05/2015  . Chronic pain [G89.29] 09/27/2012  . 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 Clotting disorder [D68.9] 09/26/2012  . Osteomyelitis, L tibia [M86.9] 09/26/2012  . Lupus [M32.9]   . Encounter for long-term (current) use of anticoagulants [Z79.01] 04/19/2012  . 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 Pulmonary embolism [I26.99] 04/14/2012   Total Time spent with patient: 25 minutes    Past Medical History:  Past Medical History  Diagnosis Date  . Pulmonary embolism 2008    seen at [facility]--right lung  . Lupus   . Pleurisy   . 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 Arthritis   . Osteomyelitis   . Depression   . Anxiety     Past Surgical History  Procedure Laterality Date  . Multiple orthopeadic surgeries      both ankles  . Ankle fusions      bilat fusions, multiple  .  Hardware removal  09/25/2012    Procedure: HARDWARE REMOVAL;  Surgeon: Budd Palmer, MD;  Location: [facility] OR;  Service: Orthopedics;  Laterality: Left;  HARDWARE REMOVAL LEFT ANKLE   . Tibia osteotomy  09/25/2012    Procedure: TIBIAL OSTEOTOMY;  Surgeon: Budd Palmer, MD;  Location: [facility] OR;  Service: Orthopedics;;  partial excision of tibia, placement of non-biodegradeable drug delivery device, left tibia  . Ankle arthrodesis  10/23/2012  . Ankle fusion  10/23/2012    Procedure: ARTHRODESIS ANKLE;  Surgeon: Budd Palmer, MD;  Location: [facility] OR;  Service: Orthopedics;  Laterality: Left;  REMOVAL RETAINED ANTIBIOTIC NAIL LEFT TIBIA/TIBIO TALAR FUSION   Family History:  Family History  Problem Relation Age of Onset  . Clotting disorder Paternal Aunt   . Cancer Maternal Uncle    Social History:  History  Alcohol Use  . Yes    Comment: ocassionally     History  Drug Use  . Yes  . Special: Oxycodone, Cocaine, Marijuana    History   Social History  . Marital Status: Single    Spouse Name: N/A  . Number of Children: N/A  . Years of Education: N/A   Occupational History  . diabled    Social History Main Topics  . Smoking status: Current Every Day Smoker -- 0.50 packs/day for 27 years    Types: Cigarettes  . Smokeless tobacco: Never Used  . Alcohol Use: Yes     Comment:  ocassionally  . Drug Use: Yes    Special: Oxycodone, Cocaine, Marijuana  . Sexual Activity: Yes   Other Topics Concern  . None   Social History Narrative   Additional History:    Sleep: Good  Appetite:  Fair, improving   Assessment:   Musculoskeletal: Strength & Muscle Tone: within normal limits Gait & Station:  Slowed  Patient leans: N/A   Psychiatric Specialty Exam: Physical Exam  Psychiatric: Her speech is normal and behavior is normal. She exhibits a depressed mood.    ROS  Blood pressure 114/69, pulse 91, temperature 97.9 F (36.6 C), temperature source Oral, resp. rate 18, height 5'  11" (1.803 m), weight 94.348 kg (208 lb), last menstrual period 02/18/2015.Body mass index is 29.02 kg/(m^2).  General Appearance: Fairly Groomed  Patent attorneyye Contact::  Good  Speech:  Normal Rate  Volume:  Normal  Mood:  Depressed, improving  Affect:  reactive, smiles at times appropriately  Thought Process:  Goal Directed and Linear  Orientation:  Other:  fully alert and attentive   Thought Content:  Rumination and  denies any hallucinations, no delusions, not internally preoccupied \  Suicidal Thoughts:  Denies at this time   Homicidal Thoughts:  No  Memory:  Recent and Remote grossly intact  Judgement:  Fair  Insight:  Fair  Psychomotor Activity:  Decreased  Concentration:  Good  Recall:  NA  Fund of Knowledge:Good  Language: Good  Akathisia:  Negative  Handed:  Right  AIMS (if indicated):     Assets:  Communication Skills Desire for Improvement Resilience  ADL's:  Impaired  Cognition: WNL  Sleep:  Number of Hours: 5     Current Medications: Current Facility-Administered Medications  Medication Dose Route Frequency Provider Last Rate Last Dose  . acetaminophen (TYLENOL) tablet 1,000 mg  1,000 mg Oral Q6H PRN Worthy FlankIjeoma E Nwaeze, NP   1,000 mg at 03/08/15 1438  . albuterol (PROVENTIL HFA;VENTOLIN HFA) 108 (90 BASE) MCG/ACT inhaler 2 puff  2 puff Inhalation Q6H PRN Shuvon B Rankin, NP      . alum & mag hydroxide-simeth (MAALOX/MYLANTA) 200-200-20 MG/5ML suspension 30 mL  30 mL Oral Q4H PRN Shuvon B Rankin, NP      . dextromethorphan-guaiFENesin (MUCINEX DM) 30-600 MG per 12 hr tablet 1 tablet  1 tablet Oral BID Shuvon B Rankin, NP   1 tablet at 03/08/15 0804  . DULoxetine (CYMBALTA) DR capsule 30 mg  30 mg Oral Daily Craige CottaFernando A Cobos, MD   30 mg at 03/08/15 0804  . magnesium hydroxide (MILK OF MAGNESIA) suspension 30 mL  30 mL Oral Daily PRN Shuvon B Rankin, NP      . nicotine (NICODERM CQ - dosed in mg/24 hours) patch 21 mg  21 mg Transdermal Daily Craige CottaFernando A Cobos, MD   21 mg at  03/07/15 0732  . pregabalin (LYRICA) capsule 100 mg  100 mg Oral BID Adonis BrookSheila Agustin, NP      . traZODone (DESYREL) tablet 100 mg  100 mg Oral QHS PRN Worthy FlankIjeoma E Nwaeze, NP   100 mg at 03/07/15 2203  . warfarin (COUMADIN) tablet 15 mg  15 mg Oral ONCE-1800 Craige CottaFernando A Cobos, MD      . Warfarin - Pharmacist Dosing Inpatient   Does not apply q1800 Shuvon B Rankin, NP        Lab Results:  Results for orders placed or performed during the hospital encounter of 03/05/15 (from the past 48 hour(s))  Protime-INR  Status: None   Collection Time: 03/07/15  6:30 AM  Result Value Ref Range   Prothrombin Time 14.4 11.6 - 15.2 seconds   INR 1.11 0.00 - 1.49    Comment: Performed at [facility]  Protime-INR     Status: Abnormal   Collection Time: 03/08/15  6:11 AM  Result Value Ref Range   Prothrombin Time 15.4 (H) 11.6 - 15.2 seconds   INR 1.21 0.00 - 1.49    Comment: Performed at [facility]    Physical Findings: AIMS: Facial and Oral Movements Muscles of Facial Expression: None, normal Lips and Perioral Area: None, normal Jaw: None, normal Tongue: None, normal,Extremity Movements Upper (arms, wrists, hands, fingers): None, normal Lower (legs, knees, ankles, toes): None, normal, Trunk Movements Neck, shoulders, hips: None, normal, Overall Severity Severity of abnormal movements (highest score from questions above): None, normal Incapacitation due to abnormal movements: None, normal Patient's awareness of abnormal movements (rate only patient's report): No Awareness, Dental Status Current problems with teeth and/or dentures?: No Does patient usually wear dentures?: No  CIWA:  CIWA-Ar Total: 0 COWS:  COWS Total Score: 3  Assessment- at this time patient reporting improvement, less depression and a sense of regret and concern about her recent impulsive suicide attempt by overdosing. She presents fairly groomed, and although denies severe depression,  continues to present somewhat labile, emotionally frail. Tolerating medications well, and has had no side effects. No bleeding reported .   Treatment Plan Summary: Daily contact with patient to assess and evaluate symptoms and progress in treatment, Medication management, Plan continue inpatient treatment and continue medications as below  Increase Cymbalta to 30 mgrs QDAY  Increased Lyrica to 100 mg BID Pharmacy managing Coumadin  Will continue with current treatment plan.  Increased Lyrica to 50 mg bid; Added Albuterol inhaler and Mucinex DM Medical Decision Making:  Established Problem, Stable/Improving (1), Review of Psycho-Social Stressors (1), Review or order clinical lab tests (1) and Review of Medication Regimen & Side Effects (2)   Velna Hatchet May Agustin, FNP-BC 03/08/2015, 2:51 PM   I agreed with the findings, treatment and disposition plan of this patient. Kathryne Sharper, MD

## 2015-03-08 NOTE — Progress Notes (Signed)
ANTICOAGULATION CONSULT NOTE - Follow Up Consult  Pharmacy Consult for Coumadin Indication: dvt  No Known Allergies  Patient Measurements: Height: 5\' 11"  (180.3 cm) Weight: 208 lb (94.348 kg) IBW/kg (Calculated) : 70.8   Vital Signs: Temp: 97.9 F (36.6 C) (04/17 0600) BP: 114/69 mmHg (04/17 0601) Pulse Rate: 91 (04/17 0601)  Labs:  Recent Labs  03/06/15 0623 03/07/15 0630 03/08/15 0611  LABPROT 18.3* 14.4 15.4*  INR 1.51* 1.11 1.21    Estimated Creatinine Clearance: 109.9 mL/min (by C-G formula based on Cr of 0.81).   Medications:  Scheduled:  . dextromethorphan-guaiFENesin  1 tablet Oral BID  . DULoxetine  30 mg Oral Daily  . nicotine  21 mg Transdermal Daily  . pregabalin  50 mg Oral BID  . warfarin  15 mg Oral ONCE-1800  . Warfarin - Pharmacist Dosing Inpatient   Does not apply q1800    Assessment: INR below goal no problems with therapy noted Goal of Therapy:  INR 2-3    Plan:  Coumadin 15 mg x 1 today  Pt/inr in am   [name], Tionna Gigante Marie 03/08/2015,12:34 PM

## 2015-03-09 LAB — PROTIME-INR
INR: 1.2 (ref 0.00–1.49)
Prothrombin Time: 15.3 seconds — ABNORMAL HIGH (ref 11.6–15.2)

## 2015-03-09 MED ORDER — DULOXETINE HCL 20 MG PO CPEP
40.0000 mg | ORAL_CAPSULE | Freq: Every day | ORAL | Status: DC
Start: 2015-03-10 — End: 2015-03-10
  Administered 2015-03-10: 40 mg via ORAL
  Filled 2015-03-09: qty 28
  Filled 2015-03-09 (×2): qty 2

## 2015-03-09 MED ORDER — WARFARIN SODIUM 5 MG PO TABS
20.0000 mg | ORAL_TABLET | Freq: Once | ORAL | Status: AC
  Administered 2015-03-09: 20 mg via ORAL
  Filled 2015-03-09: qty 2
  Filled 2015-03-09: qty 4

## 2015-03-09 NOTE — Plan of Care (Signed)
Problem: Alteration in mood Goal: LTG-Patient reports reduction in suicidal thoughts (Patient reports reduction in suicidal thoughts and is able to verbalize a safety plan for whenever patient is feeling suicidal)  Outcome: Progressing Patient denies suicidal thoughts and reports that she will seek staff if suicidal thoughts return.

## 2015-03-09 NOTE — [facility] Group Notes (Signed)
[facility] LCSW Group Therapy 03/09/2015  1:15 pm  Type of Therapy: Group Therapy Participation Level: Active  Participation Quality: Attentive, Sharing and Supportive  Affect: Appropriate  Cognitive: Alert and Oriented  Insight: Developing/Improving and Engaged  Engagement in Therapy: Developing/Improving and Engaged  Modes of Intervention: Clarification, Confrontation, Discussion, Education, Exploration,  Limit-setting, Orientation, Problem-solving, Rapport Building, Dance movement psychotherapisteality Testing, Socialization and Support  Summary of Progress/Problems: Pt identified obstacles faced currently and processed barriers involved in overcoming these obstacles. Pt identified steps necessary for overcoming these obstacles and explored motivation (internal and external) for facing these difficulties head on. Pt further identified one area of concern in their lives and chose a goal to focus on for today. Patient identified taking better care of herself physically and emotionally as her short term goal. Patient provided with emotional support and encouragement.  Samuella BruinKristin Imraan Wendell, MSW, [employer]CSWA Clinical Social Worker [facility] [phone]

## 2015-03-09 NOTE — Progress Notes (Signed)
Patient ID: Crystal Frank, female   DOB: 24-Jan-1966, 49 y.o.   MRN: [id number] PER STATE REGULATIONS 482.30  THIS CHART WAS REVIEWED FOR MEDICAL NECESSITY WITH RESPECT TO THE PATIENT'S ADMISSION/ DURATION OF STAY.  NEXT REVIEW DATE: 03/13/2015  Crystal RoughJENNIFER JONES [name], RN, BSN CASE MANAGER

## 2015-03-09 NOTE — [facility] Group Notes (Signed)
   [facility]  03/09/2015  8:45 AM   Participation Quality: Alert, Appropriate and Oriented  Mood/Affect: Appropriate  Depression Rating: 0  Anxiety Rating: 5  Thoughts of Suicide: Pt denies SI/HI  Will you contract for safety? Yes  Current AVH: Pt denies  Plan for Discharge/Comments: Pt attended discharge planning group and actively participated in group. CSW provided pt with today's workbook. Patient reports feeling "good" today. She plans to return home to follow up with outpatient services.  Transportation Means: Pt reports access to transportation by bus  Supports: No supports mentioned at this time  Samuella BruinKristin Marticia Reifschneider, MSW, [employer]CSWA Clinical Social Worker [facility] [phone]

## 2015-03-09 NOTE — Plan of Care (Signed)
Problem: Alteration in mood Goal: LTG-Patient reports reduction in suicidal thoughts (Patient reports reduction in suicidal thoughts and is able to verbalize a safety plan for whenever patient is feeling suicidal)  Outcome: Progressing Pt is denying si thoughts today. Goal: STG-Patient reports thoughts of self-harm to staff Outcome: Progressing Pt denies self harm thoughts.

## 2015-03-09 NOTE — Progress Notes (Signed)
Adult Psychoeducational Group Note  Date:  03/09/2015 Time:  9:06 PM  Group Topic/Focus:  Wrap-Up Group:   The focus of this group is to help patients review their daily goal of treatment and discuss progress on daily workbooks.  Participation Level:  Active  Participation Quality:  Appropriate  Affect:  Appropriate  Cognitive:  Appropriate  Insight: Appropriate  Engagement in Group:  Engaged  Modes of Intervention:  Activity  Additional Comments:  Pt stated one thing she could add to help maintain her wellness is getting back on the medications that she knew worked for her in the past and taking "me time" for herself."  Caswell CorwinOwen, Adore Kithcart C 03/09/2015, 9:06 PM

## 2015-03-09 NOTE — Progress Notes (Signed)
ANTICOAGULATION CONSULT NOTE - Follow Up Consult  Pharmacy Consult for Coumadin Indication: h/o dvt  No Known Allergies  Patient Measurements: Height: 5\' 11"  (180.3 cm) Weight: 208 lb (94.348 kg) IBW/kg (Calculated) : 70.8   Vital Signs: Temp: 97.7 F (36.5 C) (04/18 0527) Temp Source: Oral (04/18 0527) BP: 152/88 mmHg (04/18 0528) Pulse Rate: 103 (04/18 0528)  Labs:  Recent Labs  03/07/15 0630 03/08/15 0611 03/09/15 0621  LABPROT 14.4 15.4* 15.3*  INR 1.11 1.21 1.20    Estimated Creatinine Clearance: 109.9 mL/min (by C-G formula based on Cr of 0.81).   Medications:  Scheduled:  . dextromethorphan-guaiFENesin  1 tablet Oral BID  . DULoxetine  30 mg Oral Daily  . nicotine  21 mg Transdermal Daily  . pregabalin  100 mg Oral BID  . warfarin  20 mg Oral ONCE-1800  . Warfarin - Pharmacist Dosing Inpatient   Does not apply q1800    Assessment: INR with no increase after 15 mg x 1 no problems noted.  INR dropped after holding coumadin. Goal of Therapy:  INR 2-3    Plan:  Coumadin 20 mg x 1  PT/INR in am   [name], Justyn Langham Marie 03/09/2015,8:25 AM

## 2015-03-09 NOTE — Progress Notes (Signed)
Writer has observed patient up in the dayroom laughing,  talking with peers and  watching tv. She spoke with writer 1:1 and reports having had a good day and is happy with her medication changes. She is preparing for discharge and she reports that she and her partner plan to go to couple counseling. She reports that she felt overwhelmed with finances, loss of the car and arguing with her partner often. She reports that she realizes that her overdose was selfish and plans to get back on track with the goals she has for herself. She denies si/hi/a/v hallucinations. Support and encouragement given, safety maintained on unit with 15 minute checks.

## 2015-03-09 NOTE — Progress Notes (Signed)
Patient ID: Crystal Frank, female   DOB: [birth year], 49 y.o.   MRN: [id number] [facility] MD Progress Note  03/09/2015 5:39 PM Crystal Frank  MRN:  [id number]   Subjective:   Patient reports she is feeling better. She is tolerating medications well. She denies side effects.     Objective  I have discussed case with staff and have met with patient. She is doing better compared to admission and at this time reports improved mood, and denies any ongoing or residual suicidal ideations. States she has spoken with her SO, and that they are " going to try to work things out". She is still ruminative about stressors, which include relationship and financial stress. No disruptive behaviors on unit, going to groups , participating in milieu. Tolerating medications well. Of note, PT/INR is low, and Coumadin doses are being monitored, adjusted  By pharmacy. Patient denies any bleeding or symptoms related to Coumadin management at this time.     Principal Problem: Suicide attempt by multiple drug overdose Diagnosis:   Patient Active Problem List   Diagnosis Date Noted  . Suicide attempt by multiple drug overdose [T50.902A] 03/05/2015  . Adjustment disorder with mixed anxiety and depressed mood [F43.23] 03/05/2015  . MDD (major depressive disorder), single episode, severe [F32.2] 03/05/2015  . Chronic pain [G89.29] 09/27/2012  . 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 Clotting disorder [D68.9] 09/26/2012  . Osteomyelitis, L tibia [M86.9] 09/26/2012  . Lupus [M32.9]   . Encounter for long-term (current) use of anticoagulants [Z79.01] 04/19/2012  . 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 Pulmonary embolism [I26.99] 04/14/2012   Total Time spent with patient: 20 minutes   Past Medical History:  Past Medical History  Diagnosis Date  . Pulmonary embolism 2008    seen at [facility]--right lung  . Lupus   . Pleurisy   . 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 Nonunion of L  tibiotalar fusion 09/26/2012  . Arthritis   . Osteomyelitis   . Depression   . Anxiety     Past Surgical History  Procedure Laterality Date  . Multiple orthopeadic surgeries      both ankles  . Ankle fusions      bilat fusions, multiple  . Hardware removal  09/25/2012    Procedure: HARDWARE REMOVAL;  Surgeon: Budd Palmer, MD;  Location: [facility] OR;  Service: Orthopedics;  Laterality: Left;  HARDWARE REMOVAL LEFT ANKLE   . Tibia osteotomy  09/25/2012    Procedure: TIBIAL OSTEOTOMY;  Surgeon: Budd Palmer, MD;  Location: [facility] OR;  Service: Orthopedics;;  partial excision of tibia, placement of non-biodegradeable drug delivery device, left tibia  . Ankle arthrodesis  10/23/2012  . Ankle fusion  10/23/2012    Procedure: ARTHRODESIS ANKLE;  Surgeon: Budd Palmer, MD;  Location: [facility] OR;  Service: Orthopedics;  Laterality: Left;  REMOVAL RETAINED ANTIBIOTIC NAIL LEFT TIBIA/TIBIO TALAR FUSION   Family History:  Family History  Problem Relation Age of Onset  . Clotting disorder Paternal Aunt   . Cancer Maternal Uncle    Social History:  History  Alcohol Use  . Yes    Comment: ocassionally     History  Drug Use  . Yes  . Special: Oxycodone, Cocaine, Marijuana    History   Social History  . Marital Status: Single    Spouse Name: N/A  . Number of Children: N/A  . Years of Education: N/A   Occupational History  . diabled  Social History Main Topics  . Smoking status: Current Every Day Smoker -- 0.50 packs/day for 27 years    Types: Cigarettes  . Smokeless tobacco: Never Used  . Alcohol Use: Yes     Comment: ocassionally  . Drug Use: Yes    Special: Oxycodone, Cocaine, Marijuana  . Sexual Activity: Yes   Other Topics Concern  . None   Social History Narrative   Additional History:    Sleep:  Fair   Appetite:  Fair, improving   Assessment:   Musculoskeletal: Strength & Muscle Tone: within normal limits Gait & Station:  Slowed  Patient leans:  N/A   Psychiatric Specialty Exam: Physical Exam  Psychiatric: Her speech is normal and behavior is normal. She exhibits a depressed mood.    Review of Systems  Constitutional: Negative for fever and chills.  Respiratory: Negative for cough and shortness of breath.   Cardiovascular: Negative for chest pain.  Gastrointestinal: Negative for nausea, vomiting and blood in stool.  Skin: Negative for rash.  Psychiatric/Behavioral: Positive for depression.    Blood pressure 152/88, pulse 103, temperature 97.7 F (36.5 C), temperature source Oral, resp. rate 16, height $RemoveBe'5\' 11"'YtNrRGANE$  (1.803 m), weight 208 lb (94.348 kg), last menstrual period 02/18/2015.Body mass index is 29.02 kg/(m^2).  General Appearance: improved grooming  Eye Contact::  Good  Speech:  Normal Rate  Volume:  Normal  Mood:   Less depressed   Affect:   Still somewhat constricted, but reactive   Thought Process:  Goal Directed and Linear  Orientation:  Other:  fully alert and attentive   Thought Content:  Still ruminative about stressors, but to a lesser degree, no psychotic symptoms  Suicidal Thoughts:  Denies at this time   Homicidal Thoughts:  No  Memory:  Recent and Remote grossly intact  Judgement:  Fair  Insight:  Fair  Psychomotor Activity:  Normal  Concentration:  Good  Recall:  NA  Fund of Knowledge:Good  Language: Good  Akathisia:  Negative  Handed:  Right  AIMS (if indicated):     Assets:  Communication Skills Desire for Improvement Resilience  ADL's:  Impaired  Cognition: WNL  Sleep:  Number of Hours: 3.75     Current Medications: Current Facility-Administered Medications  Medication Dose Route Frequency Provider Last Rate Last Dose  . acetaminophen (TYLENOL) tablet 1,000 mg  1,000 mg Oral Q6H PRN Harriet Butte, NP   1,000 mg at 03/09/15 0639  . albuterol (PROVENTIL HFA;VENTOLIN HFA) 108 (90 BASE) MCG/ACT inhaler 2 puff  2 puff Inhalation Q6H PRN Shuvon B Rankin, NP      . alum & mag hydroxide-simeth  (MAALOX/MYLANTA) 200-200-20 MG/5ML suspension 30 mL  30 mL Oral Q4H PRN Shuvon B Rankin, NP      . dextromethorphan-guaiFENesin (MUCINEX DM) 30-600 MG per 12 hr tablet 1 tablet  1 tablet Oral BID Shuvon B Rankin, NP   1 tablet at 03/09/15 1713  . DULoxetine (CYMBALTA) DR capsule 30 mg  30 mg Oral Daily Jenne Campus, MD   30 mg at 03/09/15 [phone]  . magnesium hydroxide (MILK OF MAGNESIA) suspension 30 mL  30 mL Oral Daily PRN Shuvon B Rankin, NP      . nicotine (NICODERM CQ - dosed in mg/24 hours) patch 21 mg  21 mg Transdermal Daily Jenne Campus, MD   21 mg at 03/09/15 9604  . pregabalin (LYRICA) capsule 100 mg  100 mg Oral BID Kerrie Buffalo, NP   100 mg at 03/09/15  1601  . traZODone (DESYREL) tablet 100 mg  100 mg Oral QHS PRN Harriet Butte, NP   100 mg at 03/08/15 2155  . warfarin (COUMADIN) tablet 20 mg  20 mg Oral ONCE-1800 Jenne Campus, MD      . Warfarin - Pharmacist Dosing Inpatient   Does not apply q1800 Shuvon B Rankin, NP        Lab Results:  Results for orders placed or performed during the hospital encounter of 03/05/15 (from the past 48 hour(s))  Protime-INR     Status: Abnormal   Collection Time: 03/08/15  6:11 AM  Result Value Ref Range   Prothrombin Time 15.4 (H) 11.6 - 15.2 seconds   INR 1.21 0.00 - 1.49    Comment: Performed at [place]     Status: Abnormal   Collection Time: 03/09/15  6:21 AM  Result Value Ref Range   Prothrombin Time 15.3 (H) 11.6 - 15.2 seconds   INR 1.20 0.00 - 1.49    Comment: Performed at [facility]    Physical Findings: AIMS: Facial and Oral Movements Muscles of Facial Expression: None, normal Lips and Perioral Area: None, normal Jaw: None, normal Tongue: None, normal,Extremity Movements Upper (arms, wrists, hands, fingers): None, normal Lower (legs, knees, ankles, toes): None, normal, Trunk Movements Neck, shoulders, hips: None, normal, Overall Severity Severity of  abnormal movements (highest score from questions above): None, normal Incapacitation due to abnormal movements: None, normal Patient's awareness of abnormal movements (rate only patient's report): No Awareness, Dental Status Current problems with teeth and/or dentures?: No Does patient usually wear dentures?: No  CIWA:  CIWA-Ar Total: 0 COWS:  COWS Total Score: 3  Assessment-  At this time patient partially improved compared to admission. She is less depressed and no longer having suicidal ideations. Still ruminative about stressors. Denies side effects from medications and feels they are helping . States she and her SO are wanting to work on their relationship , possibly go to counseling .   Treatment Plan Summary: Daily contact with patient to assess and evaluate symptoms and progress in treatment, Medication management, Plan continue inpatient treatment and continue medications as below  Continue Cymbalta   At 40 mgrs QDAY  Continue Lyrica  100 mg BID Pharmacy managing Coumadin   Medical Decision Making:  Established Problem, Stable/Improving (1), Review of Psycho-Social Stressors (1), Review or order clinical lab tests (1) and Review of Medication Regimen & Side Effects (2)   COBOS, FERNANDO,  03/09/2015, 5:39 PM

## 2015-03-09 NOTE — Progress Notes (Signed)
D:Pt denies depression or hopelessness. She rates anxiety as a 5 on 0-10 scale with 10 being the most. Pt is out in the milieu interacting with peers. 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 A:Offered support and 15 minute checks.Gave medications as ordered.  R:Pt denies si and hi. Safety maintained on the unit.

## 2015-03-09 NOTE — Progress Notes (Signed)
Recreation Therapy Notes  Date: 04.18.2016 Time: 9:30am Location: [address] Group Room   Group Topic: Stress Management  Goal Area(s) Addresses:  Patient will actively participate in stress management techniques presented during session.   Behavioral Response: Did not attend.   Micah Galeno L Dereke Neumann, LRT/CTRS  Chisa Kushner L 03/09/2015 3:29 PM 

## 2015-03-10 ENCOUNTER — Ambulatory Visit (INDEPENDENT_AMBULATORY_CARE_PROVIDER_SITE_OTHER): Payer: Medicare Other | Admitting: Pharmacist Clinician (PhC)/ Clinical Pharmacy Specialist

## 2015-03-10 ENCOUNTER — Telehealth: Payer: Self-pay | Admitting: *Deleted

## 2015-03-10 DIAGNOSIS — I2699 Other pulmonary embolism without acute cor pulmonale: Secondary | ICD-10-CM | POA: Diagnosis not present

## 2015-03-10 DIAGNOSIS — F322 Major depressive disorder, single episode, severe without psychotic features: Secondary | ICD-10-CM | POA: Insufficient documentation

## 2015-03-10 LAB — PROTIME-INR
INR: 1.26 (ref 0.00–1.49)
Prothrombin Time: 15.9 seconds — ABNORMAL HIGH (ref 11.6–15.2)

## 2015-03-10 MED ORDER — WARFARIN - PHARMACIST DOSING INPATIENT
Freq: Once | Status: DC
  Filled 2015-03-10: qty 1

## 2015-03-10 MED ORDER — DULOXETINE HCL 40 MG PO CPEP: 40.0000 mg | ORAL_CAPSULE | Freq: Every day | ORAL | Status: DC

## 2015-03-10 MED ORDER — PREGABALIN 100 MG PO CAPS: 100.0000 mg | ORAL_CAPSULE | Freq: Two times a day (BID) | ORAL | Status: DC

## 2015-03-10 MED ORDER — WARFARIN SODIUM 5 MG PO TABS
15.0000 mg | ORAL_TABLET | Freq: Every day | ORAL | Status: DC
Start: 2015-03-11 — End: 2015-03-10
  Filled 2015-03-10: qty 6

## 2015-03-10 MED ORDER — WARFARIN SODIUM 5 MG PO TABS: ORAL_TABLET | ORAL | Status: DC

## 2015-03-10 MED ORDER — WARFARIN SODIUM 10 MG PO TABS
20.0000 mg | ORAL_TABLET | Freq: Once | ORAL | Status: DC
  Filled 2015-03-10: qty 2

## 2015-03-10 MED ORDER — TRAZODONE HCL 100 MG PO TABS: 100.0000 mg | ORAL_TABLET | Freq: Every evening | ORAL | Status: DC | PRN

## 2015-03-10 NOTE — [facility] Group Notes (Signed)
[facility] LCSW Group Therapy 03/10/2015  1:15 PM   Type of Therapy: Group Therapy  Participation Level: Did Not Attend. Patient invited to attend but declined.   Samuella BruinKristin Anthonio Mizzell, MSW, [employer]CSWA Clinical Social Worker [facility] [phone]

## 2015-03-10 NOTE — Progress Notes (Signed)
D: Patient is alert and oriented. Pt's mood and affect is pleasant and anxious at times. Pt denies SI/HI and AVH. Pt reports she is ready to go home today. Pt complains of chronic neck and ankle pain rating both 9/10 this morning with some relief from PRN medication. Pt denies depression and hopelessness, pt rates anxiety 5/10. Pt is attending unit groups. A: Encouragement/Support provided to pt. Active listening by RN. Medication education reviewed with pt. PRN medication administered for pain per providers orders (See MAR). Scheduled medications administered per providers orders (See MAR). 15 minute checks continued per protocol for patient safety.  R: Patient cooperative and receptive to nursing interventions. Pt remains safe.

## 2015-03-10 NOTE — Progress Notes (Signed)
D   Pt is pleasant on approach and cooperative  She smiles and said she will be discharged tomorrow and is ready to go   She said she learned a lot of new coping skills and has a more positive outlook and feeling about herself A   Verbal support and encouragement given  Medications administered and effectiveness monitored   Q 15 min checks  R   Pt is safe at present

## 2015-03-10 NOTE — Progress Notes (Signed)
DISCHARGE NOTE: D: Patient was alert, oriented, in stable condition, and ambulatory with a steady gait upon discharge. Pt denies SI/HI and AVH. A: AVS reviewed and given to pt. Follow up reviewed with pt. Resources reviewed with pt, including [facility]. Prescriptions/Medications given to pt. Belongings returned to pt. Pt given time to ask questions and express concerns. R: Pt D/C'd to [employer].

## 2015-03-10 NOTE — Progress Notes (Addendum)
Pt attended spiritual caregroupongriefand loss facilitated by counseling intern [place] Freemon Binford. Groupopened with brief discussion and psycho-social ed aroundgriefand loss in relationships and in relation to self - identifying life patterns, circumstances, changes that cause losses. Establishedgroupnorm of speaking from own life experience.Groupgoal of establishing open and affirming space for members to share loss and experience withgrief, normalizegriefexperience and provide psycho social education andgriefsupport.Groupdrew on narrative and Alderian therapeutic modalities.   Crystal Frank was engaged with group and supported another individual in helping him identify how to trust and confide in others. Spoke about needing to have patience with others and also self. Affect seemed positive and would frequently have conversations with other group members.   [place]Jordan Jatorian Renault Counseling Intern

## 2015-03-10 NOTE — [facility] Suicide Risk Assessment (Addendum)
[facility]   Demographic Factors:  49 year old female, lives with partner, on disability  Total Time spent with patient: 30 minutes  Musculoskeletal: Strength & Muscle Tone: within normal limits Gait & Station: walks slowly, due to history of trauma/fractures, but steadily Patient leans: N/A  Psychiatric Specialty Exam: Physical Exam  ROS  Blood pressure 152/88, pulse 103, temperature 97.7 F (36.5 C), temperature source Oral, resp. rate 16, height 5\' 11"  (1.803 m), weight 208 lb (94.348 kg), last menstrual period 02/18/2015.Body mass index is 29.02 kg/(m^2).  General Appearance: improved grooming  Eye Contact::  Good  Speech:  Normal Rate409  Volume:  Normal  Mood:  Euthymic  Affect:  Full Range  Thought Process:  Linear  Orientation:  Full (Time, Place, and Person)  Thought Content:  no hallucinaitons, no delusions  Suicidal Thoughts:  No- denies any thoughts of hurting self   Homicidal Thoughts:  No  Memory:  recent and remote grossly intact   Judgement:  Other:  improved   Insight:  Present  Psychomotor Activity:  Normal  Concentration:  Good  Recall:  Good  Fund of Knowledge:Good  Language: Good  Akathisia:  Negative  Handed:  Right  AIMS (if indicated):     Assets:  Communication Skills Desire for Improvement Resilience Social Support  Sleep:  Number of Hours: 5.75  Cognition: WNL  ADL's:  Improved       Has this patient used any form of tobacco in the last 30 days? (Cigarettes, Smokeless Tobacco, Cigars, and/or Pipes) Yes, A prescription for an FDA-approved tobacco cessation medication was offered at discharge and the patient refused  Mental Status Per Nursing Assessment::   On Admission:  NA  Current Mental Status by Physician: At this time patient is improved compared to admission , presents euthymic, with full range of affect, no thought disorder, no SI , no HI, no psychotic symptoms.  Loss Factors:  history of disability,  relationship strain  Historical Factors: This was first psychiatric admission, no prior suicidal attempts   Risk Reduction Factors:   Sense of responsibility to family, Living with another person, especially a relative, Positive social support and Positive coping skills or problem solving skills  Continued Clinical Symptoms:  At this time patient is improved compared to admission, as noted above. Currently euthymic .  Cognitive Features That Contribute To Risk:  No gross cognitive deficits noted upon discharge. Is alert , attentive, and oriented x 3   Suicide Risk:  Mild:  Suicidal ideation of limited frequency, intensity, duration, and specificity.  There are no identifiable plans, no associated intent, mild dysphoria and related symptoms, good self-control (both objective and subjective assessment), few other risk factors, and identifiable protective factors, including available and accessible social support.  Principal Problem: Suicide attempt by multiple drug overdose Discharge Diagnoses:  Patient Active Problem List   Diagnosis Date Noted  . Suicide attempt by multiple drug overdose [T50.902A] 03/05/2015  . Adjustment disorder with mixed anxiety and depressed mood [F43.23] 03/05/2015  . MDD (major depressive disorder), single episode, severe [F32.2] 03/05/2015  . Chronic pain [G89.29] 09/27/2012  . 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 Clotting disorder [D68.9] 09/26/2012  . Osteomyelitis, L tibia [M86.9] 09/26/2012  . Lupus [M32.9]   . Encounter for long-term (current) use of anticoagulants [Z79.01] 04/19/2012  . 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 Pulmonary embolism [I26.99] 04/14/2012    Follow-up Information    Follow [facility] with [facility] On 04/08/2015.   Why:  Medication management appointment with Mont Dutton on Wednesday May 18th at 1:15 pm. Please bring a list of your medications and insurance card. Call  office if you need to reschedule appointment.   Contact information:   [address] [place], [place] [address] Phone: [phone] Fax: [phone]      Plan Of Care/Follow-up recommendations:  Activity:  as tolerated  Diet:  regular  Tests:  na Other:  See below  Is patient on multiple antipsychotic therapies at discharge:  No   Has Patient had three or more failed trials of antipsychotic monotherapy by history:  No  Recommended Plan for Multiple Antipsychotic Therapies: NA   Patient is leaving unit in good spirits. She is returning home.  Follow up as above. For management of coumadin/medical issues follows up at [facility]. * With patient's express consent , I spoke with [facility], and she has an appointment today at 3,15 PM for coumadin management.Jama Flavors, Arella Blinder 03/10/2015, 9:04 AM

## 2015-03-10 NOTE — [facility] Group Notes (Signed)
[facility] Group Notes:  (Nursing/MHT/Case Management/Adjunct)  Date:  03/10/2015  Time:  0900am  Type of Therapy:  Nurse Education  Participation Level:  Did Not Attend  Participation Quality:  Did not attend  Affect:  Did not attend  Cognitive:  Did not attend  Insight:  None  Engagement in Group:  Did not attend  Modes of Intervention:  Discussion, Education and Support  Summary of Progress/Problems: Patient was consulting with MD during group and was therefore unable to attend.  Lendell CapriceGuthrie, Jerimiah Wolman A 03/10/2015, 10:33 AM

## 2015-03-10 NOTE — Progress Notes (Signed)
ANTICOAGULATION CONSULT NOTE - Follow Up Consult  Pharmacy Consult for Coumadin Indication: DVT  No Known Allergies  Patient Measurements: Height: 5\' 11"  (180.3 cm) Weight: 208 lb (94.348 kg) IBW/kg (Calculated) : 70.8    Labs:  Recent Labs  03/08/15 0611 03/09/15 0621 03/10/15 0630  LABPROT 15.4* 15.3* 15.9*  INR 1.21 1.20 1.26    Estimated Creatinine Clearance: 109.9 mL/min (by C-G formula based on Cr of 0.81).   Medications:  Scheduled:  . dextromethorphan-guaiFENesin  1 tablet Oral BID  . DULoxetine  40 mg Oral Daily  . nicotine  21 mg Transdermal Daily  . pregabalin  100 mg Oral BID  . warfarin  20 mg Oral ONCE-1800  . Warfarin - Pharmacist Dosing Inpatient   Does not apply q1800    Assessment: INR well below goal No problems noted with therapy  Goal of Therapy:  INR 2-3    Plan:  Coumadin 20 mg x 1 today  PT/INR in am   [name], Yaritza Leist Marie 03/10/2015,9:04 AM

## 2015-03-10 NOTE — Tx Team (Signed)
Interdisciplinary Treatment Plan Update (Adult) Date: 03/10/2015   Time Reviewed: 9:30 AM  Progress in Treatment: Attending groups: Yes Participating in groups: Yes Taking medication as prescribed: Yes Tolerating medication: Yes Family/Significant other contact made: No, patient has declined for CSW to make collateral contact  Patient understands diagnosis: Yes Discussing patient identified problems/goals with staff: Yes Medical problems stabilized or resolved: Yes Denies suicidal/homicidal ideation: Yes, denies  Issues/concerns per patient self-inventory: Yes Other:  New problem(s) identified: N/A  Discharge Plan or Barriers:   4/15: Patient plans to return home to follow up with outpatient services. She is requesting a referral for PCP and Orthopedic Services as well.  4/19: Patient plans to return home to follow up with outpatient services to follow up with [facility].   Reason for Continuation of Hospitalization:  Depression Anxiety Medication Stabilization   Comments: N/A  Estimated length of stay: Discharge anticipated today 4/19  For review of initial/current patient goals, please see plan of care. Patient is a 49 year old female admitted for SI, depression, and an overdose on medications. Patient identifies finances and lack of a strong support system as stressors. [place]. Patient will benefit from crisis stabilization, medication evaluation, group therapy, and psycho education in addition to case management for discharge planning. Patient and CSW reviewed pt's identified goals and treatment plan. Pt verbalized understanding and agreed to treatment plan.     Attendees: Patient:    Family:    Physician: Dr. Jama Flavorsobos; Dr. Dub MikesLugo 03/10/2015 9:30 AM  Nursing: Joslyn Devonaroline Beaudry, Merian CapronMarian Friedman, Harold Barbanonecia Byrd, [place]ittany Guthrie, [place] 03/10/2015 9:30 AM  Clinical Social Worker: Samuella BruinKristin Letroy Vazguez,  LCSWA 03/10/2015 9:30 AM  Other: Juline PatchQuylle  Hodnett, LCSW 03/10/2015 9:30 AM  Other: Trula SladeHeather Smart, LCSWA 03/10/2015 9:30 AM  Other: Onnie BoerJennifer Clark, Case Manager 03/10/2015 9:30 AM  Other: Mosetta AnisAggie Nwoko, Laura Davis NP 03/10/2015 9:30 AM  Other:    Other:    Other:      Scribe for Treatment Team:  Samuella BruinKristin Adalea Handler, MSW, [employer]CSWA [phone]

## 2015-03-10 NOTE — Progress Notes (Signed)
  [facility]  Will you be returning to the same living situation after discharge:  Yes,  patient plans to return to her residence At discharge, do you have transportation home?: Yes,  patient provided with cab voucher to MD appt and a bus pass to get home Do you have the ability to pay for your medications: Yes,  patient provided with medication samples and prescriptions at discharge.   Release of information consent forms completed and in the chart;  Patient's signature needed at discharge.  Patient to Follow up at: Follow-up Information    Follow up with [facility] On 04/08/2015.   Why:  Medication management appointment with Mont Duttonrystal Montegue on Wednesday May 18th at 1:15 pm. Please bring a list of your medications and insurance card. Call office if you need to reschedule appointment.   Contact information:   [address] [place], [place] [address] Phone: [phone] Fax: [phone]      [facility].   Why:  Appointment to check coumadin levels on Tuesday April 19th at 3:15pm.    Contact information:   [address] [place], [place] [address] [phone]      Call to follow up.   Why:  Please call [facility] Follow up at [phone]      Patient denies SI/HI: Yes,  denies    Safety Planning and Suicide Prevention discussed: Yes,  with patient     Has patient been referred to the Quitline?: Yes, faxed on 03/06/15  Crystal Frank, [place] CarboKristin L 03/10/2015, 4:23 PM

## 2015-03-10 NOTE — Discharge Summary (Signed)
Physician Discharge Summary Note  Patient:  Crystal Frank is an 49 y.o., female MRN:  [id number] DOB:  1965/12/31 Patient phone:  [phone] (home)  Patient address:   [address] [place] [address],  Total Time spent with patient: 30 minutes  Date of Admission:  03/05/2015 Date of Discharge: 03/10/15  Reason for Admission:  Severe depressive symptoms  Principal Problem: Suicide attempt by multiple drug overdose Discharge Diagnoses: Patient Active Problem List   Diagnosis Date Noted  . Major depressive disorder, single episode, severe without psychotic features [F32.2]   . Suicide attempt by multiple drug overdose [T50.902A] 03/05/2015  . Adjustment disorder with mixed anxiety and depressed mood [F43.23] 03/05/2015  . MDD (major depressive disorder), single episode, severe [F32.2] 03/05/2015  . Chronic pain [G89.29] 09/27/2012  . 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 Clotting disorder [D68.9] 09/26/2012  . Osteomyelitis, L tibia [M86.9] 09/26/2012  . Lupus [M32.9]   . Encounter for long-term (current) use of anticoagulants [Z79.01] 04/19/2012  . 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 Pulmonary embolism [I26.99] 04/14/2012   Musculoskeletal: Strength & Muscle Tone: within normal limits Gait & Station: walks slowly, due to history of trauma/fractures, but steadily Patient leans: N/A  Psychiatric Specialty Exam: Physical Exam  Psychiatric: She has a normal mood and affect. Her speech is normal and behavior is normal. Judgment and thought content normal. Cognition and memory are normal.    Review of Systems  Constitutional: Negative.   HENT: Negative.   Eyes: Negative.   Respiratory: Negative.   Cardiovascular: Negative.   Gastrointestinal: Negative.   Genitourinary: Negative.   Musculoskeletal: Negative.   Skin: Negative.   Neurological: Negative.   Endo/Heme/Allergies: Negative.   Psychiatric/Behavioral: Positive for  depression (Stabilized with treatments). Negative for suicidal ideas, hallucinations, memory loss and substance abuse. The patient is not nervous/anxious and does not have insomnia.     Blood pressure 152/88, pulse 103, temperature 97.7 F (36.5 C), temperature source Oral, resp. rate 16, height 5\' 11"  (1.803 m), weight 94.348 kg (208 lb), last menstrual period 02/18/2015.Body mass index is 29.02 kg/(m^2).  See Physician SRA     Past Medical History:  Past Medical History  Diagnosis Date  . Pulmonary embolism 2008    seen at [facility]--right lung  . Lupus   . Pleurisy   . 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 Arthritis   . Osteomyelitis   . Depression   . Anxiety     Past Surgical History  Procedure Laterality Date  . Multiple orthopeadic surgeries      both ankles  . Ankle fusions      bilat fusions, multiple  . Hardware removal  09/25/2012    Procedure: HARDWARE REMOVAL;  Surgeon: Budd PalmerMichael H Handy, MD;  [facility];  Service: Orthopedics;  Laterality: Left;  HARDWARE REMOVAL LEFT ANKLE   . Tibia osteotomy  09/25/2012    Procedure: TIBIAL OSTEOTOMY;  Surgeon: Budd PalmerMichael H Handy, MD;  [facility];  Service: Orthopedics;;  partial excision of tibia, placement of non-biodegradeable drug delivery device, left tibia  . Ankle arthrodesis  10/23/2012  . Ankle fusion  10/23/2012    Procedure: ARTHRODESIS ANKLE;  Surgeon: Budd PalmerMichael H Handy, MD;  [facility];  Service: Orthopedics;  Laterality: Left;  REMOVAL RETAINED ANTIBIOTIC NAIL LEFT TIBIA/TIBIO TALAR FUSION   Family History:  Family History  Problem Relation Age of Onset  . Clotting disorder Paternal Aunt   .  Cancer Maternal Uncle    Social History:  History  Alcohol Use  . Yes    Comment: ocassionally     History  Drug Use  . Yes  . Special: Oxycodone, Cocaine, Marijuana    History   Social History  . Marital Status: Single    Spouse Name: N/A  . Number of Children: N/A   . Years of Education: N/A   Occupational History  . diabled    Social History Main Topics  . Smoking status: Current Every Day Smoker -- 0.50 packs/day for 27 years    Types: Cigarettes  . Smokeless tobacco: Never Used  . Alcohol Use: Yes     Comment: ocassionally  . Drug Use: Yes    Special: Oxycodone, Cocaine, Marijuana  . Sexual Activity: Yes   Other Topics Concern  . None   Social History Narrative    Risk to Self: Is patient at risk for suicide?: Yes What has been your use of drugs/alcohol within the last 12 months?: Occasional marijuana, cocaine, alcohol use Risk to Others:   Prior Inpatient Therapy:   Prior Outpatient Therapy:    Level of Care:  OP  Hospital Course:    Crystal Frank is a 49 y.o. female who voluntarily presents to [facility] with SI thoughts and depression. Patient was brought in by EMS after a significant overdose. Patient reported she was depressed and wrote a SI note and ingested several medications in a SI attempt--she took coumadin 37.5 mg, tegretol, and topamax(seven pills each), she also took  and elavil . Patient expected to fall asleep and not wake up, however the neighbor found her on the floor of her home. Patient was unable to ambulate without falling and EMS was called. Patient states she's been suicidal for two days because of a bad break up with her girlfriend and financial problems. Patient reported that she feels helpless and hopeless.         Crystal Frank was admitted to the adult unit where she was evaluated and her symptoms were identified. Medication management was discussed and implemented. The patient was not taking any psychiatric medications prior to admission. She was started on Cymbalta 40 mg daily to address chronic pain and depressive symptoms. She was encouraged to participate in unit programming. Medical problems were identified and treated appropriately. Her coumadin administration was monitored daily by the  Pharmacist. Patient was also started on Lyrica 100 mg twice daily to help with chronic pain. Home medication was restarted as needed.  She was evaluated each day by a clinical provider to ascertain the patient's response to treatment.  Improvement was noted by the patient's report of decreasing symptoms, improved sleep and appetite, affect, medication tolerance, behavior, and participation in unit programming.  The patient was asked each day to complete a self inventory noting mood, mental status, pain, new symptoms, anxiety and concerns.         She responded well to medication and being in a therapeutic and supportive environment. Positive and appropriate behavior was noted and the patient was motivated for recovery.  She worked closely with the treatment team and case manager to develop a discharge plan with appropriate goals. Coping skills, problem solving as well as relaxation therapies were also part of the unit programming.         By the day of discharge she was in much improved condition than upon admission.  Symptoms were reported as significantly decreased or resolved completely. The patient denied SI/HI  and voiced no AVH. She was motivated to continue taking medication with a goal of continued improvement in mental health.   Crystal Frank was discharged home with a plan to follow up as noted below. The patient was provided with sample medications and prescriptions at time of discharge. She left [facility] in stable condition with all belongings returned to her.   Consults:  None  Significant Diagnostic Studies:  UDS positive for benzos, EKG, regular monitoring of PT/INR  Discharge Vitals:   Blood pressure 152/88, pulse 103, temperature 97.7 F (36.5 C), temperature source Oral, resp. rate 16, height 5\' 11"  (1.803 m), weight 94.348 kg (208 lb), last menstrual period 02/18/2015. Body mass index is 29.02 kg/(m^2). Lab Results:   Results for orders placed or performed during the hospital encounter of  03/05/15 (from the past 72 hour(s))  Protime-INR     Status: Abnormal   Collection Time: 03/08/15  6:11 AM  Result Value Ref Range   Prothrombin Time 15.4 (H) 11.6 - 15.2 seconds   INR 1.21 0.00 - 1.49    Comment: Performed at [facility]  Protime-INR     Status: Abnormal   Collection Time: 03/09/15  6:21 AM  Result Value Ref Range   Prothrombin Time 15.3 (H) 11.6 - 15.2 seconds   INR 1.20 0.00 - 1.49    Comment: Performed at [facility]  Protime-INR     Status: Abnormal   Collection Time: 03/10/15  6:30 AM  Result Value Ref Range   Prothrombin Time 15.9 (H) 11.6 - 15.2 seconds   INR 1.26 0.00 - 1.49    Comment: Performed at [facility]    Physical Findings: AIMS: Facial and Oral Movements Muscles of Facial Expression: None, normal Lips and Perioral Area: None, normal Jaw: None, normal Tongue: None, normal,Extremity Movements Upper (arms, wrists, hands, fingers): None, normal Lower (legs, knees, ankles, toes): None, normal, Trunk Movements Neck, shoulders, hips: None, normal, Overall Severity Severity of abnormal movements (highest score from questions above): None, normal Incapacitation due to abnormal movements: None, normal Patient's awareness of abnormal movements (rate only patient's report): No Awareness, Dental Status Current problems with teeth and/or dentures?: No Does patient usually wear dentures?: No  CIWA:  CIWA-Ar Total: 0 COWS:  COWS Total Score: 3   See Psychiatric Specialty Exam and Suicide Risk Assessment completed by Attending Physician prior to discharge.  Discharge destination:  Home  Is patient on multiple antipsychotic therapies at discharge:  No   Has Patient had three or more failed trials of antipsychotic monotherapy by history:  No  Recommended Plan for Multiple Antipsychotic Therapies: NA  Discharge Instructions    Discharge instructions    Complete by:  As directed   Please follow  up tomorrow at the coumadin clinic for continued treatment guidelines due to recent overdose on the medication, which has required increased monitoring to ensure therapeutic levels.            Medication List    TAKE these medications      Indication   DULoxetine HCl 40 MG Cpep  Take 40 mg by mouth daily.   Indication:  Major Depressive Disorder     naproxen sodium 220 MG tablet  Commonly known as:  ANAPROX  Take 220 mg by mouth daily as needed.      pregabalin 100 MG capsule  Commonly known as:  LYRICA  Take 1 capsule (100 mg total) by mouth 2 (two) times daily.  Indication:  Neuropathic Pain     traZODone 100 MG tablet  Commonly known as:  DESYREL  Take 1 tablet (100 mg total) by mouth at bedtime as needed for sleep.   Indication:  Trouble Sleeping     warfarin 5 MG tablet  Commonly known as:  COUMADIN  Take as directed by coumadin clinic   Indication:  Blood Vessel Obstruction by a Blood Clot           Follow-up Information    Follow up with [facility] On 04/08/2015.   Why:  Medication management appointment with Mont Dutton on Wednesday May 18th at 1:15 pm. Please bring a list of your medications and insurance card. Call office if you need to reschedule appointment.   Contact information:   [address] [place], [place] [address] Phone: [phone] Fax: [phone]      Follow-up recommendations:   Activity: as tolerated  Diet: regular  Tests: na Other: See below  Comments:   Take all your medications as prescribed by your mental healthcare provider.  Report any adverse effects and or reactions from your medicines to your outpatient provider promptly.  Patient is instructed and cautioned to not engage in alcohol and or illegal drug use while on prescription medicines.  In the event of worsening symptoms, patient is instructed to call the crisis hotline, 911 and or go to the nearest ED for appropriate evaluation  and treatment of symptoms.  Follow-up with your primary care provider for your other medical issues, concerns and or health care needs.   Total Discharge Time: Greater than 30 minutes  Signed: DAVIS, LAURA NP-C 03/10/2015, 9:53 AM   Patient seen, Suicide Assessment Completed.  Disposition Plan Reviewed

## 2015-03-10 NOTE — Telephone Encounter (Signed)
[facility] who states pt is being discharged today and needs to have INR checked as she has not been seen since November 2015. Dr Jama Flavorsobos states in the hospital due to overdose and depression and has been in hospital for 6 days. April 15th INR 1.51 April 16 coumadin 15mg   April 17th INR 1.21  Coumadin 15mg   April 18th INR 1.2  Coumadin 20 mg  And April 19th INR 1.26 coumadin 20 mg . Appt made to see pt today April 19th

## 2015-03-13 ENCOUNTER — Ambulatory Visit (INDEPENDENT_AMBULATORY_CARE_PROVIDER_SITE_OTHER): Payer: Medicare Other | Admitting: *Deleted

## 2015-03-13 DIAGNOSIS — I2699 Other pulmonary embolism without acute cor pulmonale: Secondary | ICD-10-CM

## 2015-03-13 DIAGNOSIS — F4323 Adjustment disorder with mixed anxiety and depressed mood: Secondary | ICD-10-CM | POA: Diagnosis not present

## 2015-03-13 DIAGNOSIS — Z630 Problems in relationship with spouse or partner: Secondary | ICD-10-CM | POA: Diagnosis not present

## 2015-03-13 LAB — POCT INR: INR: 2

## 2015-03-13 NOTE — Progress Notes (Signed)
Patient Discharge Instructions:  After Visit Summary (AVS):   Faxed to:  03/13/15 Discharge Summary Note:   Faxed to:  03/13/15 Psychiatric Admission Assessment Note:   Faxed to:  03/13/15 Suicide Risk Assessment - Discharge Assessment:   Faxed to:  03/13/15 Faxed/Sent to the Next Level Care provider:  03/13/15 Next Level Care Provider Has Access to the EMR, 03/13/15  Faxed to [facility] @ [phone] Records provided to [facility] via CHL/Epic access.  Jerelene ReddenSheena E Pisgah, 03/13/2015, 1:58 PM

## 2015-03-25 ENCOUNTER — Ambulatory Visit (INDEPENDENT_AMBULATORY_CARE_PROVIDER_SITE_OTHER): Payer: Medicare Other | Admitting: *Deleted

## 2015-03-25 DIAGNOSIS — I2699 Other pulmonary embolism without acute cor pulmonale: Secondary | ICD-10-CM

## 2015-03-25 LAB — POCT INR: INR: 1.7

## 2015-03-29 ENCOUNTER — Encounter (HOSPITAL_COMMUNITY): Payer: Self-pay | Admitting: Emergency Medicine

## 2015-03-29 ENCOUNTER — Emergency Department (HOSPITAL_COMMUNITY)
Admission: EM | Admit: 2015-03-29 | Discharge: 2015-04-01 | Disposition: A | Discharge status: Other Acute Inpatient Hospital | Payer: Medicare Other | Attending: Emergency Medicine | Admitting: Emergency Medicine

## 2015-03-29 DIAGNOSIS — IMO0002 Reserved for concepts with insufficient information to code with codable children: Secondary | ICD-10-CM

## 2015-03-29 DIAGNOSIS — T424X2A Poisoning by benzodiazepines, intentional self-harm, initial encounter: Secondary | ICD-10-CM | POA: Insufficient documentation

## 2015-03-29 DIAGNOSIS — F332 Major depressive disorder, recurrent severe without psychotic features: Secondary | ICD-10-CM | POA: Insufficient documentation

## 2015-03-29 DIAGNOSIS — X58XXXA Exposure to other specified factors, initial encounter: Secondary | ICD-10-CM | POA: Insufficient documentation

## 2015-03-29 DIAGNOSIS — Y9289 Other specified places as the place of occurrence of the external cause: Secondary | ICD-10-CM | POA: Insufficient documentation

## 2015-03-29 DIAGNOSIS — F10120 Alcohol abuse with intoxication, uncomplicated: Secondary | ICD-10-CM | POA: Diagnosis not present

## 2015-03-29 DIAGNOSIS — T426X4A Poisoning by other antiepileptic and sedative-hypnotic drugs, undetermined, initial encounter: Secondary | ICD-10-CM | POA: Diagnosis not present

## 2015-03-29 DIAGNOSIS — Z8739 Personal history of other diseases of the musculoskeletal system and connective tissue: Secondary | ICD-10-CM | POA: Diagnosis not present

## 2015-03-29 DIAGNOSIS — T391X2A Poisoning by 4-Aminophenol derivatives, intentional self-harm, initial encounter: Secondary | ICD-10-CM | POA: Diagnosis not present

## 2015-03-29 DIAGNOSIS — Z7901 Long term (current) use of anticoagulants: Secondary | ICD-10-CM | POA: Insufficient documentation

## 2015-03-29 DIAGNOSIS — F1510 Other stimulant abuse, uncomplicated: Secondary | ICD-10-CM | POA: Diagnosis not present

## 2015-03-29 DIAGNOSIS — F1910 Other psychoactive substance abuse, uncomplicated: Secondary | ICD-10-CM

## 2015-03-29 DIAGNOSIS — Z79899 Other long term (current) drug therapy: Secondary | ICD-10-CM | POA: Insufficient documentation

## 2015-03-29 DIAGNOSIS — T5194XA Toxic effect of unspecified alcohol, undetermined, initial encounter: Secondary | ICD-10-CM | POA: Diagnosis not present

## 2015-03-29 DIAGNOSIS — Y9389 Activity, other specified: Secondary | ICD-10-CM | POA: Diagnosis not present

## 2015-03-29 DIAGNOSIS — Z720 Tobacco use: Secondary | ICD-10-CM | POA: Insufficient documentation

## 2015-03-29 DIAGNOSIS — Z86711 Personal history of pulmonary embolism: Secondary | ICD-10-CM | POA: Insufficient documentation

## 2015-03-29 DIAGNOSIS — R000 Tachycardia, unspecified: Secondary | ICD-10-CM | POA: Diagnosis not present

## 2015-03-29 DIAGNOSIS — T424X4A Poisoning by benzodiazepines, undetermined, initial encounter: Secondary | ICD-10-CM | POA: Diagnosis not present

## 2015-03-29 DIAGNOSIS — R45850 Homicidal ideations: Secondary | ICD-10-CM | POA: Diagnosis not present

## 2015-03-29 DIAGNOSIS — F419 Anxiety disorder, unspecified: Secondary | ICD-10-CM | POA: Diagnosis not present

## 2015-03-29 DIAGNOSIS — T50904A Poisoning by unspecified drugs, medicaments and biological substances, undetermined, initial encounter: Secondary | ICD-10-CM | POA: Diagnosis not present

## 2015-03-29 DIAGNOSIS — T50902A Poisoning by unspecified drugs, medicaments and biological substances, intentional self-harm, initial encounter: Secondary | ICD-10-CM | POA: Diagnosis not present

## 2015-03-29 DIAGNOSIS — Y998 Other external cause status: Secondary | ICD-10-CM | POA: Insufficient documentation

## 2015-03-29 DIAGNOSIS — F1410 Cocaine abuse, uncomplicated: Secondary | ICD-10-CM | POA: Diagnosis not present

## 2015-03-29 DIAGNOSIS — F1210 Cannabis abuse, uncomplicated: Secondary | ICD-10-CM | POA: Diagnosis not present

## 2015-03-29 DIAGNOSIS — T391X4A Poisoning by 4-Aminophenol derivatives, undetermined, initial encounter: Secondary | ICD-10-CM | POA: Diagnosis not present

## 2015-03-29 DIAGNOSIS — T426X2A Poisoning by other antiepileptic and sedative-hypnotic drugs, intentional self-harm, initial encounter: Secondary | ICD-10-CM | POA: Diagnosis present

## 2015-03-29 DIAGNOSIS — F1310 Sedative, hypnotic or anxiolytic abuse, uncomplicated: Secondary | ICD-10-CM | POA: Insufficient documentation

## 2015-03-29 HISTORY — DX: Other chronic pain: G89.29

## 2015-03-29 HISTORY — DX: Other psychoactive substance abuse, uncomplicated: F19.10

## 2015-03-29 LAB — RAPID URINE DRUG SCREEN, HOSP PERFORMED
Amphetamines: POSITIVE — AB
Barbiturates: NOT DETECTED
Benzodiazepines: POSITIVE — AB
Cocaine: POSITIVE — AB
Opiates: NOT DETECTED
Tetrahydrocannabinol: POSITIVE — AB

## 2015-03-29 LAB — COMPREHENSIVE METABOLIC PANEL
ALT: 26 U/L (ref 14–54)
AST: 24 U/L (ref 15–41)
Albumin: 4 g/dL (ref 3.5–5.0)
Alkaline Phosphatase: 60 U/L (ref 38–126)
Anion gap: 8 (ref 5–15)
BUN: 8 mg/dL (ref 6–20)
CO2: 20 mmol/L — ABNORMAL LOW (ref 22–32)
Calcium: 8.3 mg/dL — ABNORMAL LOW (ref 8.9–10.3)
Chloride: 109 mmol/L (ref 101–111)
Creatinine, Ser: 0.86 mg/dL (ref 0.44–1.00)
GFR calc Af Amer: >60 mL/min (ref >60)
GFR calc non Af Amer: >60 mL/min (ref >60)
Glucose, Bld: 94 mg/dL (ref 70–99)
Potassium: 3.2 mmol/L — ABNORMAL LOW (ref 3.5–5.1)
Sodium: 137 mmol/L (ref 135–145)
Total Bilirubin: 0.9 mg/dL (ref 0.3–1.2)
Total Protein: 6.7 g/dL (ref 6.5–8.1)

## 2015-03-29 LAB — CBC WITH DIFFERENTIAL/PLATELET
Basophils Absolute: 0 10*3/uL (ref 0.0–0.1)
Basophils Relative: 0 % (ref 0–1)
Eosinophils Absolute: 0.1 10*3/uL (ref 0.0–0.7)
Eosinophils Relative: 2 % (ref 0–5)
HCT: 38.6 % (ref 36.0–46.0)
Hemoglobin: 13.1 g/dL (ref 12.0–15.0)
Lymphocytes Relative: 37 % (ref 12–46)
Lymphs Abs: 3.3 10*3/uL (ref 0.7–4.0)
MCH: 29 pg (ref 26.0–34.0)
MCHC: 33.9 g/dL (ref 30.0–36.0)
MCV: 85.4 fL (ref 78.0–100.0)
Monocytes Absolute: 0.9 10*3/uL (ref 0.1–1.0)
Monocytes Relative: 10 % (ref 3–12)
Neutro Abs: 4.5 10*3/uL (ref 1.7–7.7)
Neutrophils Relative %: 51 % (ref 43–77)
Platelets: 269 10*3/uL (ref 150–400)
RBC: 4.52 MIL/uL (ref 3.87–5.11)
RDW: 15.2 % (ref 11.5–15.5)
WBC: 8.9 10*3/uL (ref 4.0–10.5)

## 2015-03-29 LAB — SALICYLATE LEVEL: Salicylate Lvl: <4 mg/dL (ref 2.8–30.0)

## 2015-03-29 LAB — PROTIME-INR
INR: 3.66 — ABNORMAL HIGH (ref 0.00–1.49)
Prothrombin Time: 36.7 seconds — ABNORMAL HIGH (ref 11.6–15.2)

## 2015-03-29 LAB — ACETAMINOPHEN LEVEL: Acetaminophen (Tylenol), Serum: <10 ug/mL — ABNORMAL LOW (ref 10–30)

## 2015-03-29 LAB — ETHANOL: Alcohol, Ethyl (B): <5 mg/dL (ref <5)

## 2015-03-29 MED ORDER — POTASSIUM CHLORIDE CRYS ER 20 MEQ PO TBCR
40.0000 meq | EXTENDED_RELEASE_TABLET | Freq: Once | ORAL | Status: AC
  Administered 2015-03-29: 40 meq via ORAL
  Filled 2015-03-29: qty 2

## 2015-03-29 MED ORDER — SODIUM CHLORIDE 0.9 % IV BOLUS (SEPSIS)
1000.0000 mL | Freq: Once | INTRAVENOUS | Status: AC
  Administered 2015-03-29: 1000 mL via INTRAVENOUS

## 2015-03-29 NOTE — ED Notes (Signed)
Patient was unable to give urine sample at this time °

## 2015-03-29 NOTE — ED Notes (Signed)
Witnessed in and out cath done by NT [employer]Bonnie

## 2015-03-29 NOTE — ED Notes (Signed)
Brought in by EMS from home with c/o drug overdose.  Pt was observed by neighbor "passed out" with very slow respirations at around 10 per minute---- neighbor called EMS.  Pt alert and responsive on EMS' arrival.  Pt reported that she took 9 caps of Lyrica 100 mg at noon--- pt denies trying to harm self--- stated that she was trying to "kill her chronic pain".  Pt also reported drinking alcohol.

## 2015-03-29 NOTE — ED Notes (Signed)
Bed: YQ65WA12 Expected date:  Expected time:  Means of arrival:  Comments: EMS 59F took 900mg  of Lyrica
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
## 2015-03-30 ENCOUNTER — Encounter (HOSPITAL_COMMUNITY): Payer: Self-pay | Admitting: Emergency Medicine

## 2015-03-30 DIAGNOSIS — F332 Major depressive disorder, recurrent severe without psychotic features: Secondary | ICD-10-CM

## 2015-03-30 DIAGNOSIS — T50901A Poisoning by unspecified drugs, medicaments and biological substances, accidental (unintentional), initial encounter: Secondary | ICD-10-CM | POA: Insufficient documentation

## 2015-03-30 DIAGNOSIS — F1910 Other psychoactive substance abuse, uncomplicated: Secondary | ICD-10-CM | POA: Insufficient documentation

## 2015-03-30 DIAGNOSIS — R45850 Homicidal ideations: Secondary | ICD-10-CM | POA: Diagnosis not present

## 2015-03-30 DIAGNOSIS — IMO0002 Reserved for concepts with insufficient information to code with codable children: Secondary | ICD-10-CM | POA: Insufficient documentation

## 2015-03-30 MED ORDER — LORAZEPAM 2 MG/ML IJ SOLN: 1.0000 mg | Freq: Once | INTRAMUSCULAR | Status: DC

## 2015-03-30 MED ORDER — DULOXETINE HCL 20 MG PO CPEP
40.0000 mg | ORAL_CAPSULE | Freq: Every day | ORAL | Status: DC
  Administered 2015-03-31 – 2015-04-01 (×2): 40 mg via ORAL
  Filled 2015-03-30 (×3): qty 2

## 2015-03-30 MED ORDER — IBUPROFEN 200 MG PO TABS: 600.0000 mg | ORAL_TABLET | Freq: Three times a day (TID) | ORAL | Status: DC | PRN

## 2015-03-30 MED ORDER — NICOTINE 21 MG/24HR TD PT24: 21.0000 mg | MEDICATED_PATCH | Freq: Every day | TRANSDERMAL | Status: DC

## 2015-03-30 MED ORDER — PREGABALIN 50 MG PO CAPS
100.0000 mg | ORAL_CAPSULE | Freq: Two times a day (BID) | ORAL | Status: DC
  Administered 2015-03-30 – 2015-03-31 (×2): 100 mg via ORAL
  Filled 2015-03-30 (×2): qty 2

## 2015-03-30 MED ORDER — ACETAMINOPHEN 325 MG PO TABS
650.0000 mg | ORAL_TABLET | ORAL | Status: DC | PRN
  Administered 2015-03-30: 650 mg via ORAL
  Filled 2015-03-30: qty 2

## 2015-03-30 MED ORDER — AMITRIPTYLINE HCL 25 MG PO TABS
50.0000 mg | ORAL_TABLET | Freq: Every day | ORAL | Status: DC
  Administered 2015-03-30 – 2015-03-31 (×2): 50 mg via ORAL
  Filled 2015-03-30 (×2): qty 2

## 2015-03-30 MED ORDER — ALUM & MAG HYDROXIDE-SIMETH 200-200-20 MG/5ML PO SUSP: 30.0000 mL | ORAL | Status: DC | PRN

## 2015-03-30 MED ORDER — LORAZEPAM 1 MG PO TABS: 1.0000 mg | ORAL_TABLET | Freq: Once | ORAL | Status: DC

## 2015-03-30 MED ORDER — ONDANSETRON HCL 4 MG PO TABS: 4.0000 mg | ORAL_TABLET | Freq: Three times a day (TID) | ORAL | Status: DC | PRN

## 2015-03-30 MED ORDER — WARFARIN SODIUM 5 MG PO TABS: 12.5000 mg | ORAL_TABLET | Freq: Every day | ORAL | Status: DC

## 2015-03-30 MED ORDER — IBUPROFEN 200 MG PO TABS
600.0000 mg | ORAL_TABLET | Freq: Once | ORAL | Status: DC
  Filled 2015-03-30: qty 3

## 2015-03-30 MED ORDER — LORAZEPAM 1 MG PO TABS
1.0000 mg | ORAL_TABLET | Freq: Three times a day (TID) | ORAL | Status: DC | PRN
  Administered 2015-03-30 – 2015-03-31 (×3): 1 mg via ORAL
  Filled 2015-03-30 (×2): qty 1

## 2015-03-30 MED ORDER — WARFARIN - PHARMACIST DOSING INPATIENT: Freq: Every day | Status: DC

## 2015-03-30 NOTE — Progress Notes (Signed)
I was paged to pt room by SW [place] and Educational psychologistnurse Donnaly. I was told pt's girlfriend had died of an overdose and felt spiritual support would be helpful. Pt's sister Orpha BurKaty told Victorino DikeJennifer the news. Victorino DikeJennifer was appropriately upset. She expressed her feelings through use of profanity and hitting the wall a couple times. However, she remained in lying in bed. She was tearful and her sister Orpha BurKaty was tearful and begging her to continue to get professional help so that the same would not happen to her. Victorino DikeJennifer was hostile toward her sister at various points, however her sister did not give up on her. Toward the end of the visit Orpha BurKaty told her repeatedly that she loved her. Just before Katy left, her sister responded with an "I love you."  I will refer this pt to the Yahoo! IncChaplain Matt S. Please call if you need additional support before morning. Marjory Liesamela Carrington Holder Chaplain   03/30/15 1800  Clinical Encounter Type  Visited With Patient and family together

## 2015-03-30 NOTE — ED Notes (Addendum)
Crystal Frank [phone]- [phone]
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
## 2015-03-30 NOTE — ED Provider Notes (Addendum)
Patient issues seem evaluated by Dr. Criss AlvineGoldston with overdose of unknown medications but not toxic. She has been watched overnight and continues to be somnolent. She is arousable but still very groggy. She will need further observation before she is ready for discharge. Case is signed out to Dr. Donnald GarrePfeiffer.  Dione Boozeavid Sallie Maker, MD 03/30/15 [phone]  Patient's partner who had presented at the same time had been discharged and was found dead about her when overdose. Given the patient's statement of suicide pact, she will need to be evaluated by TTS for possible psychiatric admission.  Dione Boozeavid Keah Lamba, MD 03/30/15 [phone]

## 2015-03-30 NOTE — ED Notes (Signed)
Patients sister and brother on the unit to give patient the news of her girlfriends death.  Chaplain was in the room at the time as well.  Patient was initially tearful, then became angry.  Prior to sister leaving the patient reached into her boxers and pulled out a set of keys to give to her sister.  She asked the sister to go to her apartment to check on it.  Sister was asked by staff to hold the keys while patient is in the hospital.  Patient is currently in her room eating dinner.

## 2015-03-30 NOTE — ED Notes (Signed)
Pt is resting quietly with eyes closed. Appears in no distress. Respirations are even, regular, and unlabored.

## 2015-03-30 NOTE — BH Assessment (Signed)
[facility] Assessment Progress Note  The following facilities have been contacted to seek placement for this patient with results as noted:  Beds available, information faxed, decision pending:  [facility]  At capacity:  [facility]  Call rolled to voice mail, left message, awaiting call back:  [facility]  Crystal Convery, MA Triage Specialist [phone]     
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
## 2015-03-30 NOTE — ED Notes (Signed)
Pt wanded, placed in paper scrubs, [facility] to [facility]. Alerted TCU nurse of need for repeat EKG as recommended by poison control.

## 2015-03-30 NOTE — ED Notes (Signed)
MD at bedside. 

## 2015-03-30 NOTE — ED Notes (Signed)
Bed: [id number] Expected date:  Expected time:  Means of arrival:  Comments[place] [facility]

## 2015-03-30 NOTE — BH Assessment (Signed)
[facility] Assessment Progress Note      Notified of TTS Consult.  Spoke to ED secretary Clydie BraunKaren who notified NT of need to put teleassessment machine in patient room.  Attempting to connect, but not able.  Will continue to try.

## 2015-03-30 NOTE — BH Assessment (Signed)
[facility] Assessment Progress Note      Received notification that patient is too lethargic to participate in assessment.  Consult request removed.
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
## 2015-03-30 NOTE — ED Notes (Signed)
Patient appears flat, cooperative. C/o ongoing bilateral ankle pain. Denies SI, HI, AVH. Denies feelings of anxiety. States previous dose of Ativan helped her. Rates depression 2/10.  Encouragement offered. Snack provided. Ordered medications given.  Q 15 safety checks continue.

## 2015-03-30 NOTE — ED Notes (Signed)
Pt in talking with psych in consult room

## 2015-03-30 NOTE — Progress Notes (Signed)
CSW was informed by Nurse that the pt's sister would be coming to report the news of her girlfriends death.   CSW notified chaplain and made her aware of the situation.    CSW will refer the pt to the following facilities to try and obtain inpatient hospital bed:  [facility]  [place]   Crystal Frank, [place] [phone] ED CSW 03/30/2015 10:57 PM

## 2015-03-30 NOTE — ED Provider Notes (Signed)
TSS has evaluated pt: recommend inpt treatment, placement pending.  Samuel JesterKathleen Kjersten Ormiston, DO 03/30/15 1301

## 2015-03-30 NOTE — Progress Notes (Signed)
ANTICOAGULATION CONSULT NOTE - Initial Consult  Pharmacy Consult for Warfarin Indication: Hx PE, lupus  No Known Allergies  Patient Measurements:   Heparin Dosing Weight:   Vital Signs: Temp: 97.6 F (36.4 C) (05/09 0824) Temp Source: Oral (05/09 0824) BP: 114/74 mmHg (05/09 0824) Pulse Rate: 84 (05/09 0824)  Labs:  Recent Labs  03/29/15 1954  HGB 13.1  HCT 38.6  PLT 269  LABPROT 36.7*  INR 3.66*  CREATININE 0.86    CrCl cannot be calculated (Unknown ideal weight.).   Medical History: Past Medical History  Diagnosis Date  . Pulmonary embolism 2008    seen at [facility]--right lung  . Lupus   . Pleurisy   . 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 Arthritis   . Osteomyelitis   . Depression   . Anxiety   . Polysubstance abuse   . Chronic pain     Medications:  Home dose warfarin: 15mg  MWF and 12.5mg  T/R/S/S reported per patient.  Last dose 5/7 at 2000.  Per last Anticoag visit on 5/4, warfarin dose was 12.5mg  MWF, 10mg  T/R/S/S.    Assessment: 49 yoF on chronic anticoagulation for hx PE and lupus, also PMHx OM, arthritis, depression, and anxiety presents with OD on EtOH, lyrica, APAP, and klonopin.  INR supratherapeutic on admission.  Pharmacy consulted to resume warfarin dosing inpatient.  5/9: No issues with CBC. INR 3.66.    Goal of Therapy:  INR 2-3 Monitor platelets by anticoagulation protocol: Yes   Plan:  Hold warfarin tonight.  Check daily PT/INR  Haynes Hoehnolleen Sherry Blackard, PharmD, BCPS 03/30/2015, 8:41 AM  Pager: [phone]

## 2015-03-31 DIAGNOSIS — F332 Major depressive disorder, recurrent severe without psychotic features: Secondary | ICD-10-CM | POA: Diagnosis not present

## 2015-03-31 DIAGNOSIS — T50902A Poisoning by unspecified drugs, medicaments and biological substances, intentional self-harm, initial encounter: Secondary | ICD-10-CM | POA: Diagnosis not present

## 2015-03-31 LAB — PROTIME-INR
INR: 5.74 (ref 0.00–1.49)
Prothrombin Time: 52.1 seconds — ABNORMAL HIGH (ref 11.6–15.2)

## 2015-03-31 NOTE — BH Assessment (Signed)
[facility] Assessment Progress Note  The following facilities have been contacted to seek placement for this patient with results as noted:  Beds available, information sent, decision pending:  [facility]  Beds available, fax would not connect:  [facility]  No beds available but accepting referrals for future consideration:  [facility]  [facility]  Call rolled to voice mail, left message:  Doran Heaterowan   Roman Dubuc, [place] Triage Specialist [phone]

## 2015-03-31 NOTE — Progress Notes (Signed)
ANTICOAGULATION CONSULT NOTE - Follow Up  Pharmacy Consult for Warfarin Indication: Hx PE, lupus  No Known Allergies  Patient Measurements:   Heparin Dosing Weight:   Vital Signs: Temp: 97.5 F (36.4 C) (05/10 0621) Temp Source: Oral (05/10 0621) BP: 122/70 mmHg (05/10 0621) Pulse Rate: 72 (05/10 0621)  Labs:  Recent Labs  03/29/15 1954 03/31/15 0555  HGB 13.1  --   HCT 38.6  --   PLT 269  --   LABPROT 36.7* 52.1*  INR 3.66* 5.74*  CREATININE 0.86  --     CrCl cannot be calculated (Unknown ideal weight.).   Medical History: Past Medical History  Diagnosis Date  . Pulmonary embolism 2008    seen at [facility]--right lung  . Lupus   . Pleurisy   . 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 Arthritis   . Osteomyelitis   . Depression   . Anxiety   . Polysubstance abuse   . Chronic pain     Medications:  Home dose warfarin: 15mg  MWF and 12.5mg  T/R/S/S reported per patient.  Last dose 5/7 at 2000.  Per last Anticoag visit on 5/4, warfarin dose was 12.5mg  MWF, 10mg  T/R/S/S.    Assessment: 49 yoF on chronic anticoagulation for hx PE and lupus, also PMHx OM, arthritis, depression, and anxiety presents with OD on EtOH, lyrica, APAP, and klonopin.  INR supratherapeutic on admission.  Pharmacy consulted to resume warfarin dosing inpatient.  Today, 03/31/2015:     INR supratherapeutic and rising despite no warfarin yesterday  No reported bleeding  Goal of Therapy:  INR 2-3 Monitor platelets by anticoagulation protocol: Yes   Plan:  Continue to hold warfarin tonight Daily INR   Hessie KnowsJustin M Kaitlynd Phillips, PharmD, BCPS Pager [phone] 03/31/2015 7:10 AM

## 2015-03-31 NOTE — ED Notes (Signed)
Spoke with Victorino DikeJennifer in phlebotomy regarding PT-INR.

## 2015-03-31 NOTE — Progress Notes (Signed)
CSW being reviewed by [facility] for potential placement.    Maryelizabeth Rowanressa Aleya Durnell, MSW, LCSW, LCAS Clinical Social Worker [phone]

## 2015-03-31 NOTE — ED Notes (Signed)
Dr. Norlene Campbelltter notified of patient PT-INR results.

## 2015-03-31 NOTE — ED Notes (Signed)
Resting. Acuity low. 

## 2015-03-31 NOTE — Consult Note (Signed)
[facility] Consult   Reason for Consult:  Recurrent major depressive disorder, suicide attempt Referring Physician:  EDP Patient Identification: Crystal Frank MRN:  [id number] Principal Diagnosis: Major depressive disorder, recurrent severe without psychotic features Diagnosis:   Patient Active Problem List   Diagnosis Date Noted  . Major depressive disorder, recurrent severe without psychotic features [F33.2] 03/30/2015    Priority: High  . Drug overdose [T50.901A]   . Intoxication [R69]   . Polysubstance abuse [F19.10]   . Major depressive disorder, single episode, severe without psychotic features [F32.2]   . Suicide attempt by multiple drug overdose [T50.902A] 03/05/2015  . Adjustment disorder with mixed anxiety and depressed mood [F43.23] 03/05/2015  . MDD (major depressive disorder), single episode, severe [F32.2] 03/05/2015  . Chronic pain [G89.29] 09/27/2012  . 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 Clotting disorder [D68.9] 09/26/2012  . Osteomyelitis, L tibia [M86.9] 09/26/2012  . Lupus [M32.9]   . Encounter for long-term (current) use of anticoagulants [Z79.01] 04/19/2012  . 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 Pulmonary embolism [I26.99] 04/14/2012    Total Time spent with patient: 30 minutes  Subjective:   Crystal Frank is a 49 y.o. female patient admitted with Recurrent major depression, Suicide attempt.  HPI: The patient remains suicidal with multiple stressors continuing.  Her sister visited yesterday and told her about the death of her girlfriend by overdose.  Inpatient psychiatric hospitalization continues to be sought for stabilization.  HPI Elements:   Location:  Recurrent Major depression, severe, suicide attempt by OD. Quality:  SEVERE, OD ON THREE DIFFERENT PILLS.Marland Kitchen Severity:  Severe. Timing:  Acute. Duration:  Chronic mental illness. Context:  Brought in for OD.  Past Medical History:   Past Medical History  Diagnosis Date  . Pulmonary embolism 2008    seen at [facility]--right lung  . Lupus   . Pleurisy   . 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 Arthritis   . Osteomyelitis   . Depression   . Anxiety   . Polysubstance abuse   . Chronic pain     Past Surgical History  Procedure Laterality Date  . Multiple orthopeadic surgeries      both ankles  . Ankle fusions      bilat fusions, multiple  . Hardware removal  09/25/2012    Procedure: HARDWARE REMOVAL;  Surgeon: Rozanna Box, MD;  Location: [facility];  Service: Orthopedics;  Laterality: Left;  HARDWARE REMOVAL LEFT ANKLE   . Tibia osteotomy  09/25/2012    Procedure: TIBIAL OSTEOTOMY;  Surgeon: Rozanna Box, MD;  Location: [facility];  Service: Orthopedics;;  partial excision of tibia, placement of non-biodegradeable drug delivery device, left tibia  . Ankle arthrodesis  10/23/2012  . Ankle fusion  10/23/2012    Procedure: ARTHRODESIS ANKLE;  Surgeon: Rozanna Box, MD;  Location: [facility];  Service: Orthopedics;  Laterality: Left;  REMOVAL RETAINED ANTIBIOTIC NAIL LEFT TIBIA/TIBIO TALAR FUSION   Family History:  Family History  Problem Relation Age of Onset  . Clotting disorder Paternal Aunt   . Cancer Maternal Uncle    Social History:  History  Alcohol Use  . Yes    Comment: ocassionally     History  Drug Use  . Yes  . Special: Oxycodone, Cocaine, Marijuana    History   Social History  . Marital Status: Single    Spouse Name: N/A  . Number of Children:  N/A  . Years of Education: N/A   Occupational History  . diabled    Social History Main Topics  . Smoking status: Current Every Day Smoker -- 0.50 packs/day for 27 years    Types: Cigarettes  . Smokeless tobacco: Never Used  . Alcohol Use: Yes     Comment: ocassionally  . Drug Use: Yes    Special: Oxycodone, Cocaine, Marijuana  . Sexual Activity: Yes   Other Topics Concern  . None   Social History  Narrative   Additional Social History:    Prescriptions: using girlfriend's colonopin  History of alcohol / drug use?: Yes Name of Substance 1: Alcohol  1 - Age of First Use: 15 1 - Amount (size/oz): 1-2 beers 1 - Frequency: almost daily  1 - Last Use / Amount: yesterday                    Allergies:  No Known Allergies  Labs:  Results for orders placed or performed during the hospital encounter of 03/29/15 (from the past 48 hour(s))  Comprehensive metabolic panel     Status: Abnormal   Collection Time: 03/29/15  7:54 PM  Result Value Ref Range   Sodium 137 135 - 145 mmol/L   Potassium 3.2 (L) 3.5 - 5.1 mmol/L   Chloride 109 101 - 111 mmol/L   CO2 20 (L) 22 - 32 mmol/L   Glucose, Bld 94 70 - 99 mg/dL   BUN 8 6 - 20 mg/dL   Creatinine, Ser 0.86 0.44 - 1.00 mg/dL   Calcium 8.3 (L) 8.9 - 10.3 mg/dL   Total Protein 6.7 6.5 - 8.1 g/dL   Albumin 4.0 3.5 - 5.0 g/dL   AST 24 15 - 41 U/L   ALT 26 14 - 54 U/L   Alkaline Phosphatase 60 38 - 126 U/L   Total Bilirubin 0.9 0.3 - 1.2 mg/dL   GFR calc non Af Amer >60 >60 mL/min   GFR calc Af Amer >60 >60 mL/min    Comment: (NOTE) The eGFR has been calculated using the CKD EPI equation. This calculation has not been validated in all clinical situations. eGFR's persistently <60 mL/min signify possible Chronic Kidney Disease.    Anion gap 8 5 - 15  CBC with Differential     Status: None   Collection Time: 03/29/15  7:54 PM  Result Value Ref Range   WBC 8.9 4.0 - 10.5 K/uL   RBC 4.52 3.87 - 5.11 MIL/uL   Hemoglobin 13.1 12.0 - 15.0 g/dL   HCT 38.6 36.0 - 46.0 %   MCV 85.4 78.0 - 100.0 fL   MCH 29.0 26.0 - 34.0 pg   MCHC 33.9 30.0 - 36.0 g/dL   RDW 15.2 11.5 - 15.5 %   Platelets 269 150 - 400 K/uL   Neutrophils Relative % 51 43 - 77 %   Neutro Abs 4.5 1.7 - 7.7 K/uL   Lymphocytes Relative 37 12 - 46 %   Lymphs Abs 3.3 0.7 - 4.0 K/uL   Monocytes Relative 10 3 - 12 %   Monocytes Absolute 0.9 0.1 - 1.0 K/uL   Eosinophils  Relative 2 0 - 5 %   Eosinophils Absolute 0.1 0.0 - 0.7 K/uL   Basophils Relative 0 0 - 1 %   Basophils Absolute 0.0 0.0 - 0.1 K/uL  Protime-INR     Status: Abnormal   Collection Time: 03/29/15  7:54 PM  Result Value Ref Range   Prothrombin  Time 36.7 (H) 11.6 - 15.2 seconds   INR 3.66 (H) 0.00 - 1.49  Acetaminophen level     Status: Abnormal   Collection Time: 03/29/15  7:55 PM  Result Value Ref Range   Acetaminophen (Tylenol), Serum <10 (L) 10 - 30 ug/mL    Comment:        THERAPEUTIC CONCENTRATIONS VARY SIGNIFICANTLY. A RANGE OF 10-30 ug/mL MAY BE AN EFFECTIVE CONCENTRATION FOR MANY PATIENTS. HOWEVER, SOME ARE BEST TREATED AT CONCENTRATIONS OUTSIDE THIS RANGE. ACETAMINOPHEN CONCENTRATIONS >150 ug/mL AT 4 HOURS AFTER INGESTION AND >50 ug/mL AT 12 HOURS AFTER INGESTION ARE OFTEN ASSOCIATED WITH TOXIC REACTIONS.   Ethanol     Status: None   Collection Time: 03/29/15  7:55 PM  Result Value Ref Range   Alcohol, Ethyl (B) <5 <5 mg/dL    Comment:        LOWEST DETECTABLE LIMIT FOR SERUM ALCOHOL IS 11 mg/dL FOR MEDICAL PURPOSES ONLY   Salicylate level     Status: None   Collection Time: 03/29/15  7:55 PM  Result Value Ref Range   Salicylate Lvl <6.9 2.8 - 30.0 mg/dL  Urine rapid drug screen (hosp performed)     Status: Abnormal   Collection Time: 03/29/15 10:04 PM  Result Value Ref Range   Opiates NONE DETECTED NONE DETECTED   Cocaine POSITIVE (A) NONE DETECTED   Benzodiazepines POSITIVE (A) NONE DETECTED   Amphetamines POSITIVE (A) NONE DETECTED   Tetrahydrocannabinol POSITIVE (A) NONE DETECTED   Barbiturates NONE DETECTED NONE DETECTED    Comment:        DRUG SCREEN FOR MEDICAL PURPOSES ONLY.  IF CONFIRMATION IS NEEDED FOR ANY PURPOSE, NOTIFY LAB WITHIN 5 DAYS.        LOWEST DETECTABLE LIMITS FOR URINE DRUG SCREEN Drug Class       Cutoff (ng/mL) Amphetamine      1000 Barbiturate      200 Benzodiazepine   629 Tricyclics       528 Opiates          300 Cocaine           300 THC              50   Protime-INR     Status: Abnormal   Collection Time: 03/31/15  5:55 AM  Result Value Ref Range   Prothrombin Time 52.1 (H) 11.6 - 15.2 seconds   INR 5.74 (HH) 0.00 - 1.49    Comment: CRITICAL RESULT CALLED TO, READ BACK BY AND VERIFIED WITH: M. RHODES RN AT 0630 ON 05.10.16 BY SHUEA     Vitals: Blood pressure 122/70, pulse 72, temperature 97.5 F (36.4 C), temperature source Oral, resp. rate 18, last menstrual period 02/18/2015, SpO2 98 %.  Risk to Self: Suicidal Ideation: No Suicidal Intent: No Is patient at risk for suicide?: Yes Suicidal Plan?: No Specify Current Suicidal Plan:  (Denies that overdose was a SI attempt) Access to Means: Yes Specify Access to Suicidal Means:  (Pills) What has been your use of drugs/alcohol within the last 12 months?: Occasional alcohol use How many times?: 1 Other Self Harm Risks: None Triggers for Past Attempts: Other (Comment) (conflict with girlfriend) Intentional Self Injurious Behavior: None Risk to Others: Homicidal Ideation: No Thoughts of Harm to Others: No Current Homicidal Intent: No Current Homicidal Plan: No Access to Homicidal Means: No Identified Victim: none History of harm to others?: No Assessment of Violence: None Noted Violent Behavior Description: none Does patient have access to  weapons?: No Criminal Charges Pending?: No Does patient have a court date: No Prior Inpatient Therapy: Prior Inpatient Therapy: Yes Prior Therapy Dates: 2016 Prior Therapy Facilty/Provider(s): [facility] Reason for Treatment: Overdose Prior Outpatient Therapy: Prior Outpatient Therapy: No Does patient have an ACCT team?: No Does patient have Intensive In-House Services?  : No Does patient have [facility] services? : No Does patient have P4[facility] services?: No  Current Facility-Administered Medications  Medication Dose Route Frequency Provider Last Rate Last Dose  . acetaminophen (TYLENOL) tablet 650 mg  650 mg Oral  M0N PRN Delora Fuel, MD   027 mg at 03/30/15 1130  . alum & mag hydroxide-simeth (MAALOX/MYLANTA) 200-200-20 MG/5ML suspension 30 mL  30 mL Oral PRN Delora Fuel, MD      . amitriptyline (ELAVIL) tablet 50 mg  50 mg Oral QHS Delora Fuel, MD   50 mg at 25/36/64 2100  . DULoxetine (CYMBALTA) DR capsule 40 mg  40 mg Oral Daily Delora Fuel, MD   40 mg at 40/34/74 1048  . LORazepam (ATIVAN) tablet 1 mg  1 mg Oral Once Delfin Gant, NP       Or  . LORazepam (ATIVAN) injection 1 mg  1 mg Intramuscular Once Delfin Gant, NP      . LORazepam (ATIVAN) tablet 1 mg  1 mg Oral Q8H PRN Delfin Gant, NP   1 mg at 03/31/15 1048  . nicotine (NICODERM CQ - dosed in mg/24 hours) patch 21 mg  21 mg Transdermal Daily Delora Fuel, MD   21 mg at 25/95/63 1123  . ondansetron (ZOFRAN) tablet 4 mg  4 mg Oral O7F PRN Delora Fuel, MD      . Warfarin - Pharmacist Dosing Inpatient   Does not apply [id number] Delora Fuel, MD   Stopped at 03/30/15 1800   Current Outpatient Prescriptions  Medication Sig Dispense Refill  . acetaminophen (TYLENOL) 500 MG tablet Take 1,000 mg by mouth every 6 (six) hours as needed for mild pain or moderate pain.    Marland Kitchen amitriptyline (ELAVIL) 100 MG tablet Take 50 mg by mouth at bedtime.    . DULoxetine 40 MG CPEP Take 40 mg by mouth daily. 30 capsule 0  . pregabalin (LYRICA) 100 MG capsule Take 1 capsule (100 mg total) by mouth 2 (two) times daily. 60 capsule 0  . warfarin (COUMADIN) 5 MG tablet Take 15 mg on 03/11/15 and 03/12/15 then Take as directed by coumadin clinic (Patient taking differently: Take 12.5-15 mg by mouth daily. Take 15 mg MWF and 12.5 mg Tues,Thurs,Sat,Sun) 6 tablet 0  . traZODone (DESYREL) 100 MG tablet Take 1 tablet (100 mg total) by mouth at bedtime as needed for sleep. (Patient not taking: Reported on 03/29/2015) 30 tablet 0   ROS is negative for all systems reviewed except for Documented PMH and bilateral ankle surgery and pain.  Musculoskeletal: Strength & Muscle  Tone: within normal limits and Limps a little due to bilateral ankle pain , s/p surgery with ROD placement Gait & Station: normal, Limps a little due to ankle pain, bilateral Patient leans: N/A  Psychiatric Specialty Exam:     Blood pressure 122/70, pulse 72, temperature 97.5 F (36.4 C), temperature source Oral, resp. rate 18, last menstrual period 02/18/2015, SpO2 98 %.There is no weight on file to calculate BMI.  General Appearance: Casual and Disheveled  Eye Contact::  Good  Speech:  Clear and Coherent and Pressured  Volume:  Increased  Mood:  Angry, Anxious, Euphoric and  Irritable  Affect:  Congruent and Tearful  Thought Process:  Coherent, Goal Directed and Intact  Orientation:  Full (Time, Place, and Person)  Thought Content:  WDL  Suicidal Thoughts:  No  Homicidal Thoughts:  Yes.  without intent/plan  Memory:  Immediate;   Good Recent;   Good Remote;   Good  Judgement:  Poor  Insight:  Shallow  Psychomotor Activity:  Restlessness  Concentration:  Poor  Recall:  NA  Fund of Knowledge:Poor  Language: Fair  Akathisia:  NA  Handed:  Right  AIMS (if indicated):     Assets:  Desire for Improvement  ADL's:  Impaired  Cognition: WNL  Sleep:      Medical Decision Making: Review of Psycho-Social Stressors (1), Established Problem, Worsening (2), Review of Medication Regimen & Side Effects (2) and Review of New Medication or Change in Dosage (2)  Treatment Plan Summary: Daily contact with patient to assess and evaluate symptoms and progress in treatment, Medication management and Plan Accepted for admission, will be seeking placement at any Psychiatric unit, Resume  home medications.  Cymbalta 40 po daily for depression, Elavil 50 mg po at bed time for sleep, Ativan 1 mg every 8 hours for anxiety/agitation  Plan:  Recommend psychiatric Inpatient admission when medically cleared. Disposition: Johny Sax   PMHNP-BC 03/31/2015 11:30 AM Patient seen face-to-face for  psychiatric evaluation, chart reviewed and case discussed with the physician extender and developed treatment plan. Reviewed the information documented and agree with the treatment plan. Corena Pilgrim, MD

## 2015-04-01 ENCOUNTER — Inpatient Hospital Stay
Admit: 2015-04-01 | Discharge: 2015-04-06 | DRG: 885 | Disposition: A | Discharge status: Home / Self Care | Payer: Medicare Other | Source: Intra-hospital | Attending: Psychiatry | Admitting: Psychiatry

## 2015-04-01 DIAGNOSIS — F17210 Nicotine dependence, cigarettes, uncomplicated: Secondary | ICD-10-CM | POA: Diagnosis present

## 2015-04-01 DIAGNOSIS — G8929 Other chronic pain: Secondary | ICD-10-CM | POA: Diagnosis present

## 2015-04-01 DIAGNOSIS — Z86711 Personal history of pulmonary embolism: Secondary | ICD-10-CM | POA: Diagnosis not present

## 2015-04-01 DIAGNOSIS — Z9119 Patient's noncompliance with other medical treatment and regimen: Secondary | ICD-10-CM | POA: Diagnosis present

## 2015-04-01 DIAGNOSIS — Z7901 Long term (current) use of anticoagulants: Secondary | ICD-10-CM

## 2015-04-01 DIAGNOSIS — M1990 Unspecified osteoarthritis, unspecified site: Secondary | ICD-10-CM | POA: Diagnosis present

## 2015-04-01 DIAGNOSIS — F1099 Alcohol use, unspecified with unspecified alcohol-induced disorder: Secondary | ICD-10-CM | POA: Diagnosis present

## 2015-04-01 DIAGNOSIS — F1490 Cocaine use, unspecified, uncomplicated: Secondary | ICD-10-CM | POA: Diagnosis present

## 2015-04-01 DIAGNOSIS — F17200 Nicotine dependence, unspecified, uncomplicated: Secondary | ICD-10-CM

## 2015-04-01 DIAGNOSIS — E119 Type 2 diabetes mellitus without complications: Secondary | ICD-10-CM | POA: Diagnosis present

## 2015-04-01 DIAGNOSIS — D689 Coagulation defect, unspecified: Secondary | ICD-10-CM | POA: Diagnosis present

## 2015-04-01 DIAGNOSIS — F332 Major depressive disorder, recurrent severe without psychotic features: Secondary | ICD-10-CM | POA: Diagnosis present

## 2015-04-01 DIAGNOSIS — T426X2A Poisoning by other antiepileptic and sedative-hypnotic drugs, intentional self-harm, initial encounter: Secondary | ICD-10-CM | POA: Diagnosis not present

## 2015-04-01 DIAGNOSIS — F1410 Cocaine abuse, uncomplicated: Secondary | ICD-10-CM | POA: Diagnosis present

## 2015-04-01 DIAGNOSIS — F1010 Alcohol abuse, uncomplicated: Secondary | ICD-10-CM | POA: Diagnosis present

## 2015-04-01 DIAGNOSIS — F1510 Other stimulant abuse, uncomplicated: Secondary | ICD-10-CM | POA: Diagnosis present

## 2015-04-01 DIAGNOSIS — F1590 Other stimulant use, unspecified, uncomplicated: Secondary | ICD-10-CM | POA: Diagnosis present

## 2015-04-01 DIAGNOSIS — T50902A Poisoning by unspecified drugs, medicaments and biological substances, intentional self-harm, initial encounter: Secondary | ICD-10-CM | POA: Diagnosis not present

## 2015-04-01 LAB — PROTIME-INR
INR: 1.68 — ABNORMAL HIGH (ref 0.00–1.49)
Prothrombin Time: 20 seconds — ABNORMAL HIGH (ref 11.6–15.2)

## 2015-04-01 MED ORDER — DULOXETINE HCL 20 MG PO CPEP
40.0000 mg | ORAL_CAPSULE | Freq: Every day | ORAL | Status: DC
  Administered 2015-04-02: 40 mg via ORAL
  Filled 2015-04-01: qty 2

## 2015-04-01 MED ORDER — MAGNESIUM HYDROXIDE 400 MG/5ML PO SUSP
30.0000 mL | Freq: Every day | ORAL | Status: DC | PRN
Start: 2015-04-01 — End: 2015-04-06

## 2015-04-01 MED ORDER — WARFARIN - PHARMACIST DOSING INPATIENT
Freq: Every day | Status: DC
  Administered 2015-04-03 – 2015-04-04 (×2)

## 2015-04-01 MED ORDER — WARFARIN SODIUM 10 MG PO TABS
10.0000 mg | ORAL_TABLET | Freq: Every day | ORAL | Status: DC
  Administered 2015-04-02 – 2015-04-05 (×4): 10 mg via ORAL
  Filled 2015-04-01 (×5): qty 1

## 2015-04-01 MED ORDER — TRAZODONE HCL 100 MG PO TABS: 100.0000 mg | ORAL_TABLET | Freq: Every evening | ORAL | Status: DC | PRN

## 2015-04-01 MED ORDER — IBUPROFEN 600 MG PO TABS
600.0000 mg | ORAL_TABLET | Freq: Four times a day (QID) | ORAL | Status: DC | PRN
  Administered 2015-04-03 – 2015-04-06 (×7): 600 mg via ORAL
  Filled 2015-04-01 (×7): qty 1

## 2015-04-01 MED ORDER — HYDROXYZINE HCL 50 MG PO TABS: 50.0000 mg | ORAL_TABLET | Freq: Four times a day (QID) | ORAL | Status: DC | PRN

## 2015-04-01 MED ORDER — WARFARIN SODIUM 2.5 MG PO TABS
12.5000 mg | ORAL_TABLET | Freq: Once | ORAL | Status: AC
  Administered 2015-04-01: 12.5 mg via ORAL
  Filled 2015-04-01: qty 1

## 2015-04-01 MED ORDER — NICOTINE 21 MG/24HR TD PT24
21.0000 mg | MEDICATED_PATCH | Freq: Every day | TRANSDERMAL | Status: DC
  Administered 2015-04-03: 21 mg via TRANSDERMAL
  Filled 2015-04-01 (×3): qty 1

## 2015-04-01 MED ORDER — WARFARIN SODIUM 10 MG PO TABS: 10.0000 mg | ORAL_TABLET | Freq: Every day | ORAL | Status: DC

## 2015-04-01 NOTE — ED Notes (Signed)
Patient flat. Denies SI, HI, AVH. Rates chronic pain 5/10.  Encouragement offered. Snack provided.  Q 15 safety checks continue.

## 2015-04-01 NOTE — ED Notes (Signed)
[facility] contacted for transport.

## 2015-04-01 NOTE — Progress Notes (Signed)
ANTICOAGULATION CONSULT NOTE - Follow Up  Pharmacy Consult for Warfarin Indication: Hx PE, lupus  No Known Allergies  Patient Measurements:   Heparin Dosing Weight:   Vital Signs: Temp: 97.8 F (36.6 C) (05/11 1137) Temp Source: Oral (05/11 1137) BP: 126/73 mmHg (05/11 1137) Pulse Rate: 77 (05/11 1137)  Labs:  Recent Labs  03/29/15 1954 03/31/15 0555 04/01/15 1055  HGB 13.1  --   --   HCT 38.6  --   --   PLT 269  --   --   LABPROT 36.7* 52.1* 20.0*  INR 3.66* 5.74* 1.68*  CREATININE 0.86  --   --     CrCl cannot be calculated (Unknown ideal weight.).   Medical History: Past Medical History  Diagnosis Date  . Pulmonary embolism 2008    seen at [facility]--right lung  . Lupus   . Pleurisy   . 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 Arthritis   . Osteomyelitis   . Depression   . Anxiety   . Polysubstance abuse   . Chronic pain     Medications:  Home dose warfarin: 15mg  MWF and 12.5mg  T/R/S/S reported per patient.  Last dose 5/7 at 2000.  Per last Anticoag visit on 5/4, warfarin dose was 12.5mg  MWF, 10mg  T/R/S/S.    Assessment: 49 yoF on chronic anticoagulation for hx PE and lupus, also PMHx OM, arthritis, depression, and anxiety presents with OD on EtOH, lyrica, APAP, and klonopin.  INR supratherapeutic on admission.  Pharmacy consulted to resume warfarin dosing inpatient.  Today, 04/01/2015:     INR now subtherapeutic after being > 5 yesterday and no doses last 2 days    No reported bleeding  Goal of Therapy:  INR 2-3 Monitor platelets by anticoagulation protocol: Yes   Plan:  Warfarin 12.5mg  tonight per home regimen Daily INR   Hessie KnowsJustin M Keion Neels, PharmD, BCPS Pager [phone] 04/01/2015 12:41 PM

## 2015-04-01 NOTE — ED Notes (Signed)
Phlebotomy contacted to draw PT/INR.

## 2015-04-01 NOTE — Progress Notes (Signed)
Patient is 49 year old female, admitted to the unit with depression and SA.  Dx  Depression, Anxiety, Lupus and Polysubstance Abuse. Per documentation, pt was found unresponsive at her neighbors house. pt states she was upset that her girlfriend recently past from a drug overdose. Says they made a plan to overdose together  but denies sucide pact. Pt states she wasn't trying to kill herself but was anxious because of her girlfriend's death.  Pt is sad and depressed but cooperative. Flat affect.  Unsteady gait. amb via w/c, self propels. Pt oriented to the unit, treatment agreement signed. Denies SI/HI. No AV/H noted. No c/o pain/discomfort noted.

## 2015-04-01 NOTE — Consult Note (Signed)
[facility] Face-to[facility]Face [facility]   Reason for Consult:  Recurrent major depressive disorder, suicide attempt Referring Physician:  EDP Patient Identification: Crystal ButtsJennifer L Manzella MRN:  [id number] Principal Diagnosis: Major depressive disorder, recurrent severe without psychotic features Diagnosis:   Patient Active Problem List   Diagnosis Date Noted  . Major depressive disorder, recurrent severe without psychotic features [F33.2] 03/30/2015    Priority: High  . Drug overdose [T50.901A]   . Intoxication [R69]   . Polysubstance abuse [F19.10]   . Major depressive disorder, single episode, severe without psychotic features [F32.2]   . Suicide attempt by multiple drug overdose [T50.902A] 03/05/2015  . Adjustment disorder with mixed anxiety and depressed mood [F43.23] 03/05/2015  . MDD (major depressive disorder), single episode, severe [F32.2] 03/05/2015  . Chronic pain [G89.29] 09/27/2012  . 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 Clotting disorder [D68.9] 09/26/2012  . Osteomyelitis, L tibia [M86.9] 09/26/2012  . Lupus [M32.9]   . Encounter for long-term (current) use of anticoagulants [Z79.01] 04/19/2012  . 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 Pulmonary embolism [I26.99] 04/14/2012    Total Time spent with patient: 30 minutes  Subjective:   Crystal Frank is a 49 y.o. female patient admitted with Recurrent major depression, Suicide attempt.  HPI: The patient continues to be depressed with thoughts of hurting herself.  Presently grieving the death of her girlfriend over the weekend.    HPI Elements:   Location:  Recurrent Major depression, severe, suicide attempt by OD. Quality:  SEVERE, OD ON THREE DIFFERENT PILLS.Marland Kitchen. Severity:  Severe. Timing:  Acute. Duration:  Chronic mental illness. Context:  Brought in for OD.  Past Medical History:  Past Medical History  Diagnosis Date  . Pulmonary embolism 2008    seen at [facility]--right lung   . Lupus   . Pleurisy   . 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 Arthritis   . Osteomyelitis   . Depression   . Anxiety   . Polysubstance abuse   . Chronic pain     Past Surgical History  Procedure Laterality Date  . Multiple orthopeadic surgeries      both ankles  . Ankle fusions      bilat fusions, multiple  . Hardware removal  09/25/2012    Procedure: HARDWARE REMOVAL;  Surgeon: Budd PalmerMichael H Handy, MD;  [facility];  Service: Orthopedics;  Laterality: Left;  HARDWARE REMOVAL LEFT ANKLE   . Tibia osteotomy  09/25/2012    Procedure: TIBIAL OSTEOTOMY;  Surgeon: Budd PalmerMichael H Handy, MD;  [facility];  Service: Orthopedics;;  partial excision of tibia, placement of non-biodegradeable drug delivery device, left tibia  . Ankle arthrodesis  10/23/2012  . Ankle fusion  10/23/2012    Procedure: ARTHRODESIS ANKLE;  Surgeon: Budd PalmerMichael H Handy, MD;  [facility];  Service: Orthopedics;  Laterality: Left;  REMOVAL RETAINED ANTIBIOTIC NAIL LEFT TIBIA/TIBIO TALAR FUSION   Family History:  Family History  Problem Relation Age of Onset  . Clotting disorder Paternal Aunt   . Cancer Maternal Uncle    Social History:  History  Alcohol Use  . Yes    Comment: ocassionally     History  Drug Use  . Yes  . Special: Oxycodone, Cocaine, Marijuana    History   Social History  . Marital Status: Single    Spouse Name: N/A  . Number of Children: N/A  . Years of Education: N/A  Occupational History  . diabled    Social History Main Topics  . Smoking status: Current Every Day Smoker -- 0.50 packs/day for 27 years    Types: Cigarettes  . Smokeless tobacco: Never Used  . Alcohol Use: Yes     Comment: ocassionally  . Drug Use: Yes    Special: Oxycodone, Cocaine, Marijuana  . Sexual Activity: Yes   Other Topics Concern  . None   Social History Narrative   Additional Social History:    Prescriptions: using girlfriend's colonopin  History  of alcohol / drug use?: Yes Name of Substance 1: Alcohol  1 - Age of First Use: 15 1 - Amount (size/oz): 1-2 beers 1 - Frequency: almost daily  1 - Last Use / Amount: yesterday                    Allergies:  No Known Allergies  Labs:  Results for orders placed or performed during the hospital encounter of 03/29/15 (from the past 48 hour(s))  Protime-INR     Status: Abnormal   Collection Time: 03/31/15  5:55 AM  Result Value Ref Range   Prothrombin Time 52.1 (H) 11.6 - 15.2 seconds   INR 5.74 (HH) 0.00 - 1.49    Comment: CRITICAL RESULT CALLED TO, READ BACK BY AND VERIFIED WITH: M. RHODES RN AT 0630 ON 05.10.16 BY SHUEA   Protime-INR     Status: Abnormal   Collection Time: 04/01/15 10:55 AM  Result Value Ref Range   Prothrombin Time 20.0 (H) 11.6 - 15.2 seconds   INR 1.68 (H) 0.00 - 1.49    Vitals: Blood pressure 126/73, pulse 77, temperature 97.8 F (36.6 C), temperature source Oral, resp. rate 16, last menstrual period 02/18/2015, SpO2 97 %.  Risk to Self: Suicidal Ideation: No Suicidal Intent: No Is patient at risk for suicide?: Yes Suicidal Plan?: No Specify Current Suicidal Plan:  (Denies that overdose was a SI attempt) Access to Means: Yes Specify Access to Suicidal Means:  (Pills) What has been your use of drugs/alcohol within the last 12 months?: Occasional alcohol use How many times?: 1 Other Self Harm Risks: None Triggers for Past Attempts: Other (Comment) (conflict with girlfriend) Intentional Self Injurious Behavior: None Risk to Others: Homicidal Ideation: No Thoughts of Harm to Others: No Current Homicidal Intent: No Current Homicidal Plan: No Access to Homicidal Means: No Identified Victim: none History of harm to others?: No Assessment of Violence: None Noted Violent Behavior Description: none Does patient have access to weapons?: No Criminal Charges Pending?: No Does patient have a court date: No Prior Inpatient Therapy: Prior Inpatient  Therapy: Yes Prior Therapy Dates: 2016 Prior Therapy Facilty/Provider(s): [facility] Reason for Treatment: Overdose Prior Outpatient Therapy: Prior Outpatient Therapy: No Does patient have an ACCT team?: No Does patient have Intensive In-House Services?  : No Does patient have [facility] services? : No Does patient have [facility] services?: No  Current Facility-Administered Medications  Medication Dose Route Frequency Provider Last Rate Last Dose  . acetaminophen (TYLENOL) tablet 650 mg  650 mg Oral Q4H PRN Dione Booze, MD   650 mg at 03/30/15 1130  . alum & mag hydroxide-simeth (MAALOX/MYLANTA) 200-200-20 MG/5ML suspension 30 mL  30 mL Oral PRN Dione Booze, MD      . amitriptyline (ELAVIL) tablet 50 mg  50 mg Oral QHS Dione Booze, MD   50 mg at 03/31/15 2118  . DULoxetine (CYMBALTA) DR capsule 40 mg  40 mg Oral Daily Dione Booze, MD  40 mg at 04/01/15 1034  . LORazepam (ATIVAN) tablet 1 mg  1 mg Oral Once Earney Navy, NP       Or  . LORazepam (ATIVAN) injection 1 mg  1 mg Intramuscular Once Earney Navy, NP      . LORazepam (ATIVAN) tablet 1 mg  1 mg Oral Q8H PRN Earney Navy, NP   1 mg at 03/31/15 2118  . nicotine (NICODERM CQ - dosed in mg/24 hours) patch 21 mg  21 mg Transdermal Daily Dione Booze, MD   21 mg at 03/30/15 1123  . ondansetron (ZOFRAN) tablet 4 mg  4 mg Oral Q8H PRN Dione Booze, MD      . warfarin (COUMADIN) tablet 12.5 mg  12.5 mg Oral ONCE-1800 Hessie Knows, [facility]      . Warfarin - Pharmacist Dosing Inpatient   Does not apply [id number] Dione Booze, MD   Stopped at 03/30/15 1800   Current Outpatient Prescriptions  Medication Sig Dispense Refill  . acetaminophen (TYLENOL) 500 MG tablet Take 1,000 mg by mouth every 6 (six) hours as needed for mild pain or moderate pain.    Marland Kitchen amitriptyline (ELAVIL) 100 MG tablet Take 50 mg by mouth at bedtime.    . DULoxetine 40 MG CPEP Take 40 mg by mouth daily. 30 capsule 0  . pregabalin (LYRICA) 100 MG capsule Take 1 capsule (100 mg  total) by mouth 2 (two) times daily. 60 capsule 0  . warfarin (COUMADIN) 5 MG tablet Take 15 mg on 03/11/15 and 03/12/15 then Take as directed by coumadin clinic (Patient taking differently: Take 12.5-15 mg by mouth daily. Take 15 mg MWF and 12.5 mg Tues,Thurs,Sat,Sun) 6 tablet 0  . traZODone (DESYREL) 100 MG tablet Take 1 tablet (100 mg total) by mouth at bedtime as needed for sleep. (Patient not taking: Reported on 03/29/2015) 30 tablet 0   ROS is negative for all systems reviewed except for Documented PMH and bilateral ankle surgery and pain.  Musculoskeletal: Strength & Muscle Tone: within normal limits and Limps a little due to bilateral ankle pain , s/p surgery with ROD placement Gait & Station: normal, Limps a little due to ankle pain, bilateral Patient leans: N/A  Psychiatric Specialty Exam:     Blood pressure 126/73, pulse 77, temperature 97.8 F (36.6 C), temperature source Oral, resp. rate 16, last menstrual period 02/18/2015, SpO2 97 %.There is no weight on file to calculate BMI.  General Appearance: Casual and Disheveled  Eye Contact::  Good  Speech:  Clear and Coherent and Pressured  Volume:  Increased  Mood:  Angry, Anxious, Euphoric and Irritable  Affect:  Congruent and Tearful  Thought Process:  Coherent, Goal Directed and Intact  Orientation:  Full (Time, Place, and Person)  Thought Content:  WDL  Suicidal Thoughts:  No  Homicidal Thoughts:  Yes.  without intent/plan  Memory:  Immediate;   Good Recent;   Good Remote;   Good  Judgement:  Poor  Insight:  Shallow  Psychomotor Activity:  Restlessness  Concentration:  Poor  Recall:  NA  Fund of Knowledge:Poor  Language: Fair  Akathisia:  NA  Handed:  Right  AIMS (if indicated):     Assets:  Desire for Improvement  ADL's:  Impaired  Cognition: WNL  Sleep:      Medical Decision Making: Review of Psycho-Social Stressors (1), Established Problem, Worsening (2), Review of Medication Regimen & Side Effects (2) and  Review of New Medication or Change in Dosage (  2)  Treatment Plan Summary: Daily contact with patient to assess and evaluate symptoms and progress in treatment, Medication management and Plan Accepted for admission, will be seeking placement at any Psychiatric unit, Resume  home medications.  Cymbalta 40 po daily for depression, Elavil 50 mg po at bed time for sleep, Ativan 1 mg every 8 hours for anxiety/agitation  Plan:  Recommend psychiatric Inpatient admission when medically cleared. Disposition: [facility]  LORD, JAMISON   PMHNP-BC 04/01/2015 2:28 PM Patient seen face-to-face for psychiatric evaluation, chart reviewed and case discussed with the physician extender and developed treatment plan. Reviewed the information documented and agree with the treatment plan. Thedore MinsMojeed Leonell Lobdell, MD

## 2015-04-01 NOTE — ED Notes (Signed)
Medication education done.  Chaplain services offered and declined.  Acuity low. Tearful at times.

## 2015-04-01 NOTE — BHH Counselor (Signed)
Pt. is being reviewed for possible admission with [facility]. 

## 2015-04-01 NOTE — BHH Group Notes (Signed)
BHH Group Notes:  (Nursing/MHT/Case Management/Adjunct)  Date:  04/01/2015  Time:  9:49 PM  Type of Therapy:  Group Therapy  Participation Level:  Did Not Attend  Participation Quality:  N/A  Affect:  N/A  Cognitive:  N/A  Insight:  None  Engagement in Group:  None  Modes of Intervention:  N/A  Summary of Progress/Problems:  Tomasita MorrowChelsea Nanta Rosangela Fehrenbach 04/01/2015, 9:49 PM

## 2015-04-01 NOTE — Progress Notes (Signed)
ANTICOAGULATION CONSULT NOTE - Initial Consult  Pharmacy Consult for warfarin Indication: pulmonary embolus  No Known Allergies  Patient Measurements: Height: 5\' 11"  (180.3 cm) Weight: 208 lb (94.348 kg) IBW/kg (Calculated) : 70.8 Heparin Dosing Weight:   Vital Signs: Temp: 97.6 F (36.4 C) (05/11 2002) Temp Source: Oral (05/11 2002) BP: 144/97 mmHg (05/11 2021) Pulse Rate: 89 (05/11 2021)  Labs:  Recent Labs  03/31/15 0555 04/01/15 1055  LABPROT 52.1* 20.0*  INR 5.74* 1.68*    Estimated Creatinine Clearance: 102.4 mL/min (by C-G formula based on Cr of 0.86).   Medical History: Past Medical History  Diagnosis Date  . Pulmonary embolism 2008    seen at [facility]--right lung  . Lupus   . Pleurisy   . 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 Arthritis   . Osteomyelitis   . Depression   . Anxiety   . Polysubstance abuse   . Chronic pain     Medications:  Scheduled:  . [START ON 04/02/2015] DULoxetine  40 mg Oral Daily  . nicotine  21 mg Transdermal Daily  . [START ON 04/02/2015] warfarin  10 mg Oral q1800  . [START ON 04/02/2015] Warfarin - Pharmacist Dosing Inpatient   Does not apply q1800    Assessment: Pharmacy consulted to dose coumadin in this 49yo F with history of lupus and PE.   Goal of Therapy:  INR 2-3    Plan:  Patient admitted to [facility] on 5/8 with INR of 3.66. This increased to 5.74 on 5/10. INR on 5/11 is 1.68 and dose of coumadin 12.5mg  was given on 5/11. Will restart coumadin 10mg  daily on 5/12 and recheck INR.   Pharmacy will continue to folllow.   Adaleena Mooers K 04/01/2015,10:41 PM

## 2015-04-01 NOTE — Tx Team (Signed)
Initial Interdisciplinary Treatment Plan   PATIENT STRESSORS: Financial difficulties Marital or family conflict Substance abuse   PATIENT STRENGTHS: Average or above average intelligence Motivation for treatment/growth   PROBLEM LIST: Problem List/Patient Goals Date to be addressed Date deferred Reason deferred Estimated date of resolution  suicidal 04/01/15     depression 04/01/15                                                DISCHARGE CRITERIA:  Adequate post-discharge living arrangements Verbal commitment to aftercare and medication compliance  PRELIMINARY DISCHARGE PLAN: Return to previous living arrangement  PATIENT/FAMIILY INVOLVEMENT: This treatment plan has been presented to and reviewed with the patient, Crystal Frank, and/or family member, The patient and family have been given the opportunity to ask questions and make suggestions.  Crystal Frank 04/01/2015, 10:50 PM

## 2015-04-01 NOTE — BHH Counselor (Signed)
Pt. is to be admitted to [facility] by Dr. Ardyth HarpsHernandez. Attending Physician will be Dr. Jennet MaduroPucilowska.  Pt. has been [facility] to room 305, [facility] ElsaGwen F. [facility] ( [place], [facility] have been made aware of the admission.

## 2015-04-02 LAB — PROTIME-INR
INR: 1.23
Prothrombin Time: 15.7 seconds — ABNORMAL HIGH (ref 11.4–15.0)

## 2015-04-02 MED ORDER — AMITRIPTYLINE HCL 25 MG PO TABS
25.0000 mg | ORAL_TABLET | Freq: Every day | ORAL | Status: DC
  Administered 2015-04-02: 25 mg via ORAL
  Filled 2015-04-02 (×3): qty 1

## 2015-04-02 MED ORDER — PREGABALIN 50 MG PO CAPS
100.0000 mg | ORAL_CAPSULE | Freq: Two times a day (BID) | ORAL | Status: DC
  Administered 2015-04-02 – 2015-04-06 (×9): 100 mg via ORAL
  Filled 2015-04-02 (×9): qty 2

## 2015-04-02 MED ORDER — DULOXETINE HCL 60 MG PO CPEP
60.0000 mg | ORAL_CAPSULE | Freq: Every day | ORAL | Status: DC
  Administered 2015-04-03 – 2015-04-06 (×4): 60 mg via ORAL
  Filled 2015-04-02 (×4): qty 1

## 2015-04-02 NOTE — BHH Suicide Risk Assessment (Signed)
[facility]   Nursing information obtained from:  Patient Demographic factors:  Caucasian Current Mental Status:  Suicidal ideation indicated by others Loss Factors:  Loss of significant relationship Historical Factors:  Impulsivity Risk Reduction Factors:  NA Total Time spent with patient: 1 hour  Principal Problem: Major depressive disorder, recurrent severe without psychotic features  Diagnosis:   Patient Active Problem List   Diagnosis Date Noted  . Major depressive disorder, recurrent severe without psychotic features [F33.2] 03/30/2015    Priority: High  . Tobacco use disorder [Z72.0] 04/01/2015    Priority: Medium  . Clotting disorder [D68.9] 09/26/2012    Priority: Medium  . Drug overdose [T50.901A]   . Intoxication [R69]   . Polysubstance abuse [F19.10]   . Major depressive disorder, single episode, severe without psychotic features [F32.2]   . Suicide attempt by multiple drug overdose [T50.902A] 03/05/2015  . Adjustment disorder with mixed anxiety and depressed mood [F43.23] 03/05/2015  . MDD (major depressive disorder), single episode, severe [F32.2] 03/05/2015  . Chronic pain [G89.29] 09/27/2012  . 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 Osteomyelitis, L tibia [M86.9] 09/26/2012  . Lupus [M32.9]   . Encounter for long-term (current) use of anticoagulants [Z79.01] 04/19/2012  . 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 Pulmonary embolism [I26.99] 04/14/2012     Continued Clinical Symptoms:  Alcohol Use Disorder Identification Test Final Score (AUDIT): 4 The "Alcohol Use Disorders Identification Test", Guidelines for Use in Primary Care, Second Edition.  World Science writerHealth Organization [facility](WHO). Score between 0-7:  no or low risk or alcohol related problems. Score between 8-15:  moderate risk of alcohol related problems. Score between 16-19:  high risk of alcohol related problems. Score 20 or above:  warrants  further diagnostic evaluation for alcohol dependence and treatment.   CLINICAL FACTORS:   Depression:   Severe Alcohol/Substance Abuse/Dependencies   Musculoskeletal: Strength & Muscle Tone: within normal limits Gait & Station: normal Patient leans: N/A  Psychiatric Specialty Exam: Physical Exam  Nursing note and vitals reviewed. Constitutional: She is oriented to person, place, and time. She appears well-developed and well-nourished.  HENT:  Head: Normocephalic and atraumatic.  Eyes: Conjunctivae and EOM are normal. Pupils are equal, round, and reactive to light.  Neck: Normal range of motion. Neck supple. No thyromegaly present.  Cardiovascular: Normal rate, regular rhythm and normal heart sounds.   Respiratory: Effort normal and breath sounds normal.  GI: Soft. Bowel sounds are normal.  Musculoskeletal: Normal range of motion.  Lymphadenopathy:    She has no cervical adenopathy.  Neurological: She is alert and oriented to person, place, and time. She has normal reflexes.  Skin: Skin is warm and dry.    Review of Systems  Musculoskeletal: Positive for joint pain and neck pain.  All other systems reviewed and are negative.   Blood pressure 139/92, pulse 90, temperature 97.5 F (36.4 C), temperature source Oral, resp. rate 18, height 5\' 11"  (1.803 m), weight 94.348 kg (208 lb), last menstrual period 03/14/2015, SpO2 97 %.Body mass index is 29.02 kg/(m^2).  General Appearance: Disheveled  Eye Contact::  Minimal  Speech:  Slow  Volume:  Decreased  Mood:  Depressed  Affect:  Tearful  Thought Process:  Logical  Orientation:  Full (Time, Place, and Person)  Thought Content:  WDL  Suicidal Thoughts:  Yes.  without intent/plan  Homicidal Thoughts:  No  Memory:  Immediate;   Fair Recent;   Fair Remote;   Fair  Judgement:  Fair  Insight:  Fair  Psychomotor Activity:  Decreased  Concentration:  Fair  Recall:  [employer]Fair  Fund of Knowledge:Fair  Language: Fair  Akathisia:  No   Handed:  Right  AIMS (if indicated):     Assets:  Communication Skills Desire for Improvement Financial Resources/Insurance  Sleep:  Number of Hours: 6.45  Cognition: WNL  ADL's:  Intact     COGNITIVE FEATURES THAT CONTRIBUTE TO RISK:  None    SUICIDE RISK:   Severe:  Frequent, intense, and enduring suicidal ideation, specific plan, no subjective intent, but some objective markers of intent (i.e., choice of lethal method), the method is accessible, some limited preparatory behavior, evidence of impaired self-control, severe dysphoria/symptomatology, multiple risk factors present, and few if any protective factors, particularly a lack of social support.  PLAN OF CARE:   Ms. Jennette Kettleeal is a 49 year old female with a history of depression and substance use who was admitted to the hospital after accidental overdose on drugs to discover that her girlfriend of 7 years died of a heroine overdose.  1. Suicidal ideation. The patient has passive suicidal thoughts. She is able to contract for safety in the hospital.  2. Depression. She was started on Cymbalta 40 mg in April 2016 but has not been compliant with treatment. She was restarted on Cymbalta 40 mg in the emergency room. We will increase the dose to 60 mg.  3. Insomnia. She was started on Elavil 50 mg in the emergency room and responded well to it. We'll continue.  4. History of PE. She had PE in 2008, she has been on Coumadin ever since.  5. Chronic pain. This was in the past address with Lyrica we will restart lithium Lyrica 100 mg twice daily. She also uses Advil to address pain from broken feet.  6. Lupus. No flareup at the moment.  7. Alcohol use. The patient has been drinking. She does not report any symptoms of alcohol withdrawal. We will monitor.  8. Substance abuse treatment. The patient admits to using alcohol and cocaine. She was positive for amphetamines as well on admission. She minimizes her problems and is not interested in  residential substance abuse treatment program participation.  9. Social. According to her sister the patient is homeless now has no place to go. She is too emotional to talk about discharge planning. We will address the issue as she gets better.  10. Smoking. She is a smoker nicotine products products aren't available.  11. Disposition. [facility] [place] [facility] [place] [facility] [place] a sister. She will follow up with [facility]. Apparently she does not have medical provider at the moment. She wants to re[place] care with [place].    Medical Decision Making:  New problem, with additional work up planned, Review of Psycho-Social Stressors (1), Review or order clinical lab tests (1), Review of Medication Regimen & Side Effects (2) and Review of New Medication or Change in Dosage (2)  I certify that inpatient services furnished can reasonably be expected to improve the patient's condition.   Kristine LineaUCILOWSKA, Aws Shere 04/02/2015, 2:11 PM

## 2015-04-02 NOTE — Progress Notes (Signed)
Recreation Therapy Notes  INPATIENT RECREATION THERAPY ASSESSMENT  Patient Details Name: Crystal Frank MRN: [id number] DOB: [birth year] Today's Date: 04/02/2015  Patient Stressors: Death  Coping Skills:   Isolate, Substance Abuse, Talking, Music  Personal Challenges: Anger, Stress Management, Substance Abuse  Leisure Interests (2+):  Music - Listen, Individual - Other (Comment) (Driving)  Awareness of Community Resources:  Yes  Community Resources:  Park  Current Use: No  If no, Barriers?: Transportation, Other (Comment) (Lack of enthusiasm)  Patient Strengths:  Creative and outgoing  Patient Identified Areas of Improvement:  No, I guess I'm doing it  Current Recreation Participation:  [facility] due to finances. Talking to neighbor  Patient Goal for Hospitalization:  Does not have a goal  [name]  [place]idence[name]   Current [place] (including self-harm):  No  Current HI:  No  Consent to Intern Participation: N/A   Jacquelynn CreeGreene,Zen Cedillos M, LRT/CTRS 04/02/2015, 1:43 PM

## 2015-04-02 NOTE — Progress Notes (Signed)
Patient remained in room all shift  , out for meals . Patient seen by MD Voice of  Continual pain . Stated she slept poorly last night . Voice of appetite poor but her conceration good.  Stated  Her depression. 8 , hopelessness 5 and anxiety 2   ( low 0-10 high)  Voice of her goal was working on Depression and receiving help . Affect flat with depressed  Mood .  

## 2015-04-02 NOTE — H&P (Signed)
Psychiatric Admission Assessment Adult  Patient Identification: Crystal Frank MRN:  [id number] Date of Evaluation:  04/02/2015 Chief Complaint:  Suicidal Ideation Major Depression Principal Diagnosis: Major depressive disorder, recurrent severe without psychotic features Diagnosis:   Patient Active Problem List   Diagnosis Date Noted  . Major depressive disorder, recurrent severe without psychotic features [F33.2] 03/30/2015    Priority: High  . Tobacco use disorder [Z72.0] 04/01/2015    Priority: Medium  . Clotting disorder [D68.9] 09/26/2012    Priority: Medium  . Drug overdose [T50.901A]   . Intoxication [R69]   . Polysubstance abuse [F19.10]   . Major depressive disorder, single episode, severe without psychotic features [F32.2]   . Suicide attempt by multiple drug overdose [T50.902A] 03/05/2015  . Adjustment disorder with mixed anxiety and depressed mood [F43.23] 03/05/2015  . MDD (major depressive disorder), single episode, severe [F32.2] 03/05/2015  . Chronic pain [G89.29] 09/27/2012  . 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 Osteomyelitis, L tibia [M86.9] 09/26/2012  . Lupus [M32.9]   . Encounter for long-term (current) use of anticoagulants [Z79.01] 04/19/2012  . 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 Pulmonary embolism [I26.99] 04/14/2012   History of Present Illness:  Identifying data. Crystal Frank is a 49 year old female with a history of depression and substance use admitted for suicidal ideation in the context of major loss and substance abuse.  Chief complaint. "I don't know what to do."  History of present history of present illness. Crystal Frank denies any problems with depression or anxiety up until spring of 2016 at which time her relationship with her girlfriend of 7 years became strained. Escalating substance use contributed to the problems. The patient was admitted to [facility] in April 2016 and started on Cymbalta 40 mg.  She did not take medication consistently or follow-up with a psychiatrist. On May 9 the patient was brought to [facility] emergency room again after a accidental overdose on drugs. She denies suicidal intention. She was arguing with her girlfriend they did drugs together. The neighbor called the ambulance and both of them were taken to the emergency room. Reportedly the girlfriend talk her way out of hospital admission. She later overdose on heroine lethally. The patient apparently was unaware of heroine problem. She was transferred to [facility] for further and further treatment and stabilization. She reports some symptoms of depression prior to admission with poor sleep, decreased appetite, anhedonia, poor energy and concentration, feeling of guilt and hopelessness worthlessness, social isolation. She blames substance use more than depression for her symptoms. She is adamant about not feeling suicidal ever. She denies symptoms of anxiety. She denies symptoms suggestive of bipolar mania. There are no psychotic symptoms. The patient has been using alcohol and cocaine mostly. She denies symptoms of alcohol withdrawal.  Past psychiatric history. One prior hospitalization in April 2016. She has never been treated for depression before. No suicide attempts. She did overdose on drugs recreationally before. No history of substance abuse treatment.  Total Time spent with patient: 1 hour  Past Medical History:  Past Medical History  Diagnosis Date  . Pulmonary embolism 2008    seen at [facility]--right lung  . Lupus   . Pleurisy   . 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 Arthritis   . Osteomyelitis   . Depression   . Anxiety   . Polysubstance abuse   . Chronic pain     Past Surgical  History  Procedure Laterality Date  . Multiple orthopeadic surgeries      both ankles  . Ankle fusions      bilat fusions, multiple  . Hardware removal   09/25/2012    Procedure: HARDWARE REMOVAL;  Surgeon: Budd PalmerMichael H Handy, MD;  [facility];  Service: Orthopedics;  Laterality: Left;  HARDWARE REMOVAL LEFT ANKLE   . Tibia osteotomy  09/25/2012    Procedure: TIBIAL OSTEOTOMY;  Surgeon: Budd PalmerMichael H Handy, MD;  [facility];  Service: Orthopedics;;  partial excision of tibia, placement of non-biodegradeable drug delivery device, left tibia  . Ankle arthrodesis  10/23/2012  . Ankle fusion  10/23/2012    Procedure: ARTHRODESIS ANKLE;  Surgeon: Budd PalmerMichael H Handy, MD;  [facility];  Service: Orthopedics;  Laterality: Left;  REMOVAL RETAINED ANTIBIOTIC NAIL LEFT TIBIA/TIBIO TALAR FUSION   Family History:  Family History  Problem Relation Age of Onset  . Clotting disorder Paternal Aunt   . Cancer Maternal Uncle    Social History:  History  Alcohol Use  . 2.4 oz/week  . 4 Cans of beer per week    Comment: ocassionally     History  Drug Use  . Yes  . Special: Oxycodone, Cocaine, Marijuana    History   Social History  . Marital Status: Single    Spouse Name: N/A  . Number of Children: N/A  . Years of Education: N/A   Occupational History  . diabled    Social History Main Topics  . Smoking status: Current Every Day Smoker -- 0.50 packs/day for 27 years    Types: Cigarettes  . Smokeless tobacco: Current User  . Alcohol Use: 2.4 oz/week    4 Cans of beer per week     Comment: ocassionally  . Drug Use: Yes    Special: Oxycodone, Cocaine, Marijuana  . Sexual Activity: Yes   Other Topics Concern  . None   Social History Narrative   Additional Social History:  Social history. 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 She receives Medicare. Unfortunately following the death of her girlfriend she is homeless. She has a sister who is supportive. She has a group of friends from school that she is known for 35 years.    History of alcohol / drug use?: Yes Longest period of sobriety (when/how long):  2 1/5  years Negative Consequences of Use: Personal relationships, Financial Name of Substance 1: cocaine 1 - Age of First Use: 49 years old 1 - Frequency: occassionally 1 - Duration: couple hours during the night 1 - Last Use / Amount: 2 weeks ago Name of Substance 2: marijuana 2 - Age of First Use: 49 years old 2 - Amount (size/oz): one blunt per day 2 - Frequency: every weekend 2 - Duration: couple hours 2 - Last Use / Amount: 2 weeks ago                 Musculoskeletal: Strength & Muscle Tone: within normal limits Gait & Station: normal Patient leans: N/A  Psychiatric Specialty Exam: Physical Exam  Nursing note and vitals reviewed.   Review of Systems  Musculoskeletal: Positive for joint pain and neck pain.  All other systems reviewed and are negative.   Blood pressure 139/92, pulse 90, temperature 97.5 F (36.4 C), temperature source Oral, resp. rate 18, height 5\' 11"  (1.803 m), weight 94.348 kg (208 lb), last menstrual period 03/14/2015, SpO2 97 %.Body mass index is 29.02 kg/(m^2).  See SRA.                                                Sleep:  Number of Hours: 6.45   Risk to Self: Is patient at risk for suicide?: Yes Risk to Others:   Prior Inpatient Therapy:   Prior Outpatient Therapy:    Alcohol Screening: 1. How often do you have a drink containing alcohol?: Monthly or less 2. How many drinks containing alcohol do you have on a typical day when you are drinking?: 3 or 4 3. How often do you have six or more drinks on one occasion?: Monthly Preliminary Score: 3 4. How often during the last year have you found that you were not able to stop drinking once you had started?: Never 5. How often during the last year have you failed to do what was normally expected from you becasue of drinking?: Never 6. How often during the last year have you needed a first drink in the morning to get yourself going after a heavy drinking session?: Never 7.  How often during the last year have you had a feeling of guilt of remorse after drinking?: Never 8. How often during the last year have you been unable to remember what happened the night before because you had been drinking?: Never 9. Have you or someone else been injured as a result of your drinking?: No 10. Has a relative or friend or a doctor or another health worker been concerned about your drinking or suggested you cut down?: No Alcohol Use Disorder Identification Test Final Score (AUDIT): 4 Brief Intervention: AUDIT score less than 7 or less-screening does not suggest unhealthy drinking-brief intervention not indicated  Allergies:  No Known Allergies Lab Results:  Results for orders placed or performed during the hospital encounter of 04/01/15 (from the past 48 hour(s))  Protime-INR     Status: Abnormal   Collection Time: 04/02/15  7:19 AM  Result Value Ref Range   Prothrombin Time 15.7 (H) 11.4 - 15.0 seconds   INR 1.23    Current Medications: Current Facility-Administered Medications  Medication Dose Route Frequency Provider Last Rate Last Dose  . amitriptyline (ELAVIL) tablet 25 mg  25 mg Oral QHS Shari ProwsJolanta B Dally Oshel, MD      . Melene Muller[START ON 04/03/2015] DULoxetine (CYMBALTA) DR capsule 60 mg  60 mg Oral Daily Korrey Schleicher B Wesam Gearhart, MD      . hydrOXYzine (ATARAX/VISTARIL) tablet 50 mg  50 mg Oral Q6H PRN Jimmy FootmanAndrea Hernandez-Gonzalez, MD      . ibuprofen (ADVIL,MOTRIN) tablet 600 mg  600 mg Oral Q6H PRN Jimmy FootmanAndrea Hernandez-Gonzalez, MD      . magnesium hydroxide (MILK OF MAGNESIA) suspension 30 mL  30 mL Oral Daily PRN Jimmy FootmanAndrea Hernandez-Gonzalez, MD      . nicotine (NICODERM CQ - dosed in mg/24 hours) patch 21 mg  21 mg Transdermal Daily Jimmy FootmanAndrea Hernandez-Gonzalez, MD   21 mg at 04/01/15 2115  . pregabalin (LYRICA) capsule 100 mg  100 mg Oral BID Matson Welch B Marlaina Coburn, MD      . warfarin (COUMADIN) tablet 10 mg  10 mg Oral q1800 Shari ProwsJolanta B Carlous Olivares, MD      . Warfarin - Pharmacist Dosing  Inpatient   Does not apply [id number] Shari ProwsJolanta B Raeford Brandenburg, MD       PTA Medications: Prescriptions prior to admission  Medication Sig Dispense Refill Last Dose  . acetaminophen (TYLENOL) 500 MG tablet Take 1,000 mg by mouth every 6 (six) hours as needed for mild pain or moderate pain.   Past Week at Unknown time  . amitriptyline (ELAVIL) 100 MG tablet Take 50 mg by mouth at bedtime.   03/31/2015 at Unknown time  . DULoxetine 40 MG CPEP Take 40 mg by mouth daily. 30 capsule 0 Past Month at Unknown time  . pregabalin (LYRICA) 100 MG capsule Take 1 capsule (100 mg total) by mouth 2 (two) times daily. 60 capsule 0 Past Week at Unknown time  . traZODone (DESYREL) 100 MG tablet Take 1 tablet (100 mg total) by mouth at bedtime as needed for sleep. 30 tablet 0 Past Month at Unknown time  . warfarin (COUMADIN) 5 MG tablet Take 15 mg on 03/11/15 and 03/12/15 then Take as directed by coumadin clinic (Patient taking differently: Take 12.5-15 mg by mouth daily. Take 15 mg MWF and 12.5 mg Tues,Thurs,Sat,Sun) 6 tablet 0 04/01/2015 at Unknown time    Previous Psychotropic Medications: No.  Substance Abuse History in the last 12 months:  Yes.      Consequences of Substance Abuse: Family Consequences:  death of partner  Results for orders placed or performed during the hospital encounter of 04/01/15 (from the past 72 hour(s))  Protime-INR     Status: Abnormal   Collection Time: 04/02/15  7:19 AM  Result Value Ref Range   Prothrombin Time 15.7 (H) 11.4 - 15.0 seconds   INR 1.23     Observation Level/Precautions:  15 minute checks  Laboratory:  CBC Chemistry Profile UDS UA  Psychotherapy:    Medications:    Consultations:    Discharge Concerns:    Estimated LOS:  Other:     Psychological Evaluations: No   Treatment Plan Summary: Daily contact with patient to assess and evaluate symptoms and progress in treatment and Medication management  Medical Decision Making:  New problem, with additional work  up planned, Review of Psycho-Social Stressors (1), Review or order clinical lab tests (1), Review of Medication Regimen & Side Effects (2) and Review of New Medication or Change in Dosage (2)    Crystal Frank is a 49 year old female with a history of depression and substance use who was admitted to the hospital after accidental overdose on drugs to discover that her girlfriend of 7 years died of a heroine overdose.  1. Suicidal ideation. The patient has passive suicidal thoughts. She is able to contract for safety in the hospital.  2. Depression. She was started on Cymbalta 40 mg in April 2016 but has not been compliant with treatment. She was restarted on Cymbalta 40 mg in the emergency room. We will increase the dose to 60 mg.  3. Insomnia. She was started on Elavil 50 mg in the emergency room and responded well to it. We'll continue.  4. History of PE. She had PE in 2008, she has been on Coumadin ever since.  5. Chronic pain. This was in the past address with Lyrica we will restart lithium Lyrica 100 mg twice daily. She also uses Advil to address pain from broken feet.  6. Lupus. No flareup at the moment.  7. Alcohol use. The patient has been drinking. She does not report any symptoms of alcohol withdrawal. We will monitor.  8. Substance abuse treatment. The patient admits to using alcohol and cocaine. She was positive for amphetamines as well on admission. She minimizes her problems and  is not interested in residential substance abuse treatment program participation.  9. Social. According to her sister the patient is homeless now has no place to go. She is too emotional to talk about discharge planning. We will address the issue as she gets better.  10. Smoking. She is a smoker nicotine products products aren't available.  11. Disposition. She will be discharged back to [facility] most likely with a sister. She will follow up with [facility]. Apparently she does not have medical provider at the  moment. She wants to reestablish care with [facility] in [place].    I certify that inpatient services furnished can reasonably be expected to improve the patient's condition.   Kristine Linea 5/12/20162:23 PM

## 2015-04-02 NOTE — Progress Notes (Signed)
ANTICOAGULATION CONSULT NOTE - Follow Up  Pharmacy Consult for Warfarin Indication: Hx PE, lupus  No Known Allergies  Patient Measurements: Height: 5\' 11"  (180.3 cm) Weight: 208 lb (94.348 kg) IBW/kg (Calculated) : 70.8  Vital Signs: Temp: 97.5 F (36.4 C) (05/12 0700) Temp Source: Oral (05/12 0700) BP: 139/92 mmHg (05/12 0701) Pulse Rate: 90 (05/12 0701)  Labs:  Recent Labs  03/31/15 0555 04/01/15 1055 04/02/15 0719  LABPROT 52.1* 20.0* 15.7*  INR 5.74* 1.68* 1.23    Estimated Creatinine Clearance: 102.4 mL/min (by C-G formula based on Cr of 0.86).   Medical History: Past Medical History  Diagnosis Date  . Pulmonary embolism 2008    seen at [facility]--right lung  . Lupus   . Pleurisy   . 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 Arthritis   . Osteomyelitis   . Depression   . Anxiety   . Polysubstance abuse   . Chronic pain     Medications:  Home dose warfarin: 15mg  MWF and 12.5mg  T/R/S/S reported per patient.  Last dose 5/7 at 2000.  Per last Anticoag visit on 5/4, warfarin dose was 12.5mg  MWF, 10mg  T/R/S/S.    Assessment: Crystal Frank on chronic anticoagulation for hx PE and lupus, also PMHx OM, arthritis, depression, and anxiety presents with OD on EtOH, lyrica, APAP, and klonopin.  INR supratherapeutic on admission.  Pharmacy consulted to resume warfarin dosing inpatient.  Today, 04/02/2015:     INR now subtherapeutic after being > 5 on 5/10 with doses held   5/12 received Coumadin 12.5 mg  Goal of Therapy:  INR 2-3 Monitor platelets by anticoagulation protocol: Yes   Plan:  Will continue Warfarin 10 mg tonight per home regimen (currently ordered as 10 mg daily) as likely full affects of yesterday's dose not seen INR and Hgb ordered for AM    Crist FatHannah Heinz Eckert, PharmD, BCPS Clinical Pharmacist  04/02/2015 2:22 PM

## 2015-04-02 NOTE — Tx Team (Signed)
Interdisciplinary Treatment Plan Update (Adult)  Date:  04/02/2015 Time Reviewed:  10:27 AM  Progress in Treatment: Attending groups: No. New admit Participating in groups:  No. and As evidenced by:  new admit Taking medication as prescribed:  Yes. Tolerating medication:  Yes. Family/Significant othe contact made:  No, will contact: family if pt consents Patient understands diagnosis:  Yes. Discussing patient identified problems/goals with staff:  Yes. Medical problems stabilized or resolved:  No. Denies suicidal/homicidal ideation: No. Issues/concerns per patient self-inventory:  Yes. Other:  New problem(s) identified: Yes, Describe:  PT consult pending, pt recently lost girlfriend of 12 years due to overdose  Discharge Plan or Barriers: Family would like for pt to be referred to long-term treatment.  Referral to [facility]  Reason for Continuation of Hospitalization: Suicidal ideation, detox  Comments:  Estimated length of stay: Up to 4 days  New goal(s):   Review of initial/current patient goals per problem list:   See plan of care  Attendees: Patient:  Crystal Frank 5/12/201610:27 AM  Family:   5/12/201610:27 AM  Physician:  Kristine LineaJolanta Pucilowska, MD 5/12/201610:27 AM  Nursing:    5/12/201610:27 AM  Case Manager:   5/12/201610:27 AM  Counselor:   5/12/201610:27 AM  Other:  Jake SharkSara Laws, LCSW 5/12/201610:27 AM  Other:  Beryl MeagerJason Ingle, LCSWA 5/12/201610:27 AM  Other:  Hershal CoriaBeth Greene, LRT 5/12/201610:27 AM  Other: Delorse LekLynn Demarkis Gheen, LCSW 5/12/201610:27 AM  Other: Hulan AmatoGwen Farrish, RN 5/12/201610:27 AM  Other: Maurice Marchorrie Terry, RN, BSN 5/12/201610:27 AM  Other:  5/12/201610:27 AM  Other:  5/12/201610:27 AM  Other:  5/12/201610:27 AM  Other:   5/12/201610:27 AM   Scribe for Treatment Team:   [facility] Incynn Adewale Pucillo, LCSW [phone] [place], [place] D, 04/02/2015, 10:27 AM

## 2015-04-02 NOTE — Progress Notes (Signed)
Recreation Therapy Notes  Date: 05.12.16 Time: 3:15 pm Location: Craft Room  Group Topic: Leisure Educaion  Goal Area(s) Addresses:  Patient ill identify activities for each letter of the alphabet. Patient will verbalize ability to integrate positive leisure into life post d/c. Patient will verbalize ability to use leisure as a coping mechanism.  Behavioral Response: Did not attend  Intervention: Leisure Alphabet  Activity: Patients were given a leisure alphabet worksheet and instructed to list healthy leisure activities for each letter of the alphabet.  Education: LRT educated patients on leisure and why it is important to use it as a Associate Professorcoping skill.  Education Outcome: Did not attend  Clinical Observations/Feedback: Patient did not attend group.   Jacquelynn CreeGreene,Shaunee Mulkern M, LRT/CTRS 04/02/2015 4:28 PM

## 2015-04-02 NOTE — [facility] Group Notes (Signed)
[facility] Group Notes:  (Nursing/MHT/Case Management/Adjunct)  Date:  04/02/2015  Time:  12:02 PM  Type of Therapy:  Psychoeducational Skills  Participation Level:  Did Not Attend  Participation Quality:  n/a  Affect:  n/a  Cognitive:  n/a  Insight:  None  Engagement in Group:  n/a  Modes of Intervention:  n/a  Summary of Progress/Problems:  Lynelle SmokeCara Travis Larz Mark 04/02/2015, 12:02 PM

## 2015-04-02 NOTE — Plan of Care (Signed)
Problem: Ineffective individual coping Goal: LTG: Patient will report a decrease in negative feelings Verbalize coping skills  Goal: STG: Pt will be able to identify effective and ineffective STG: Pt will be able to identify effective and ineffective coping patterns  Listing stressors  Goal: STG: Patient will remain free from self harm Talking  about fears  Goal: STG:Pt. will utilize relaxation techniques to reduce stress STG: Patient will utilize relaxation techniques to reduce stress levels  Doing breathing techniques

## 2015-04-03 DIAGNOSIS — F1410 Cocaine abuse, uncomplicated: Secondary | ICD-10-CM | POA: Diagnosis present

## 2015-04-03 DIAGNOSIS — F1010 Alcohol abuse, uncomplicated: Secondary | ICD-10-CM | POA: Diagnosis present

## 2015-04-03 DIAGNOSIS — F1510 Other stimulant abuse, uncomplicated: Secondary | ICD-10-CM | POA: Diagnosis present

## 2015-04-03 LAB — PROTIME-INR
INR: 1.38
Prothrombin Time: 17.2 seconds — ABNORMAL HIGH (ref 11.4–15.0)

## 2015-04-03 LAB — HEMOGLOBIN: Hemoglobin: 14.5 g/dL (ref 12.0–16.0)

## 2015-04-03 MED ORDER — CARBAMAZEPINE 200 MG PO TABS
200.0000 mg | ORAL_TABLET | Freq: Two times a day (BID) | ORAL | Status: DC
  Administered 2015-04-03 – 2015-04-06 (×6): 200 mg via ORAL
  Filled 2015-04-03 (×6): qty 1

## 2015-04-03 MED ORDER — AMITRIPTYLINE HCL 25 MG PO TABS
50.0000 mg | ORAL_TABLET | Freq: Every day | ORAL | Status: DC
  Administered 2015-04-03 – 2015-04-05 (×3): 50 mg via ORAL
  Filled 2015-04-03 (×3): qty 2

## 2015-04-03 NOTE — [facility] Group Notes (Signed)
[facility] Group Notes:  (Nursing/MHT/Case Management/Adjunct)  Date:  04/03/2015  Time:  12:25 PM  Type of Therapy:  Psychoeducational Skills  Participation Level:  Active  Participation Quality:  Appropriate, Attentive and Sharing  Affect:  Appropriate  Cognitive:  Appropriate  Insight:  Appropriate  Engagement in Group:  Engaged  Modes of Intervention:  Discussion and Education  Summary of Progress/Problems:  Crystal Frank 04/03/2015, 12:25 PM

## 2015-04-03 NOTE — Progress Notes (Signed)
D: Patient denies SI/HI/AVH. Patient rates hopelessness as 2,  depression as 10, and anxiety as 2.  Patient affect is anxious and depressed. Mood is depressed and sad.  Patient tearful about friend's death. Patient did attend evening group. Patient visible on the milieu. No distress noted. A: Support and encouragement offered. Scheduled medications given to pt. Q 15 min checks continued for patient safety. R: Patient receptive. Patient remains safe on the unit.

## 2015-04-03 NOTE — Progress Notes (Signed)
[facility]  04/03/2015 8:28 PM Thurmond ButtsJennifer L Weigel  MRN:  [id number]  Subjective:  Mrs. Gean Quintielsen feels slightly better. But she is still extremely tearful and labile. She cries several times during interview. She did not sleep well with 25 of Elavil and wants it increased to 50. We also had a long discussion about the mood stabilizer. She was treated with lithium in the past. On numerous occasions she was told that she might be bipolar. She is interested in starting a mood stabilizer. She denies any somatic symptoms. Appetite is fair. She still has problems with concentration but was able to participate in programming on the unit. She is still passively suicidal but unable to contract for safety in the community. She still minimizes her substance use. We spoke with her sister who recognizes that this is an major problem and the would like the patient to be treated.  Principal Problem: Major depressive disorder, recurrent severe without psychotic features Diagnosis:   Patient Active Problem List   Diagnosis Date Noted  . Major depressive disorder, recurrent severe without psychotic features [F33.2] 03/30/2015    Priority: High  . Alcohol use disorder severe. [F10.10] 04/03/2015    Priority: Medium  . Cocaine use disorder severe. [F14.10] 04/03/2015    Priority: Medium  . Stimulant use disorder. [F15.10] 04/03/2015    Priority: Medium  . Tobacco use disorder [Z72.0] 04/01/2015    Priority: Medium  . Clotting disorder [D68.9] 09/26/2012    Priority: Medium  . Drug overdose [T50.901A]   . Intoxication [R69]   . Polysubstance abuse [F19.10]   . Major depressive disorder, single episode, severe without psychotic features [F32.2]   . Suicide attempt by multiple drug overdose [T50.902A] 03/05/2015  . Adjustment disorder with mixed anxiety and depressed mood [F43.23] 03/05/2015  . MDD (major depressive disorder), single episode, severe [F32.2] 03/05/2015  . Chronic pain [G89.29] 09/27/2012   . 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 Osteomyelitis, L tibia [M86.9] 09/26/2012  . Lupus [M32.9]   . Encounter for long-term (current) use of anticoagulants [Z79.01] 04/19/2012  . 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 Pulmonary embolism [I26.99] 04/14/2012   Total Time spent with patient: 20 minutes   Past Medical History:  Past Medical History  Diagnosis Date  . Pulmonary embolism 2008    seen at [facility]--right lung  . Lupus   . Pleurisy   . 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 Arthritis   . Osteomyelitis   . Depression   . Anxiety   . Polysubstance abuse   . Chronic pain     Past Surgical History  Procedure Laterality Date  . Multiple orthopeadic surgeries      both ankles  . Ankle fusions      bilat fusions, multiple  . Hardware removal  09/25/2012    Procedure: HARDWARE REMOVAL;  Surgeon: Budd PalmerMichael H Handy, MD;  [facility];  Service: Orthopedics;  Laterality: Left;  HARDWARE REMOVAL LEFT ANKLE   . Tibia osteotomy  09/25/2012    Procedure: TIBIAL OSTEOTOMY;  Surgeon: Budd PalmerMichael H Handy, MD;  [facility];  Service: Orthopedics;;  partial excision of tibia, placement of non-biodegradeable drug delivery device, left tibia  . Ankle arthrodesis  10/23/2012  . Ankle fusion  10/23/2012    Procedure: ARTHRODESIS ANKLE;  Surgeon: Budd PalmerMichael H Handy, MD;  [facility];  Service: Orthopedics;  Laterality: Left;  REMOVAL RETAINED ANTIBIOTIC NAIL LEFT  TIBIA/TIBIO TALAR FUSION   Family History:  Family History  Problem Relation Age of Onset  . Clotting disorder Paternal Aunt   . Cancer Maternal Uncle    Social History:  History  Alcohol Use  . 2.4 oz/week  . 4 Cans of beer per week    Comment: ocassionally     History  Drug Use  . Yes  . Special: Oxycodone, Cocaine, Marijuana    History   Social History  . Marital Status: Single    Spouse Name: N/A  . Number of Children: N/A  .  Years of Education: N/A   Occupational History  . diabled    Social History Main Topics  . Smoking status: Current Every Day Smoker -- 0.50 packs/day for 47 years    Types: Cigarettes  . Smokeless tobacco: Current User  . Alcohol Use: 2.4 oz/week    4 Cans of beer per week     Comment: ocassionally  . Drug Use: Yes    Special: Oxycodone, Cocaine, Marijuana  . Sexual Activity: Yes   Other Topics Concern  . None   Social History Narrative   Additional History:    Sleep: Poor  Appetite:  Fair   Assessment:   Musculoskeletal: Strength & Muscle Tone: within normal limits Gait & Station: normal Patient leans: N/A   Psychiatric Specialty Exam: Physical Exam  Nursing note and vitals reviewed.   Review of Systems  All other systems reviewed and are negative.   Blood pressure 113/87, pulse 90, temperature 98.3 F (36.8 C), temperature source Oral, resp. rate 18, height  (1.803 m), weight 94.348 kg (208 lb), last menstrual period 03/14/2015, SpO2 97 %.Body mass index is 29.02 kg/(m^2).  General Appearance: Casual  Eye Contact::  Good  Speech:  Clear and Coherent  Volume:  Decreased  Mood:  Depressed  Affect:  Tearful  Thought Process:  Logical  Orientation:  Full (Time, Place, and Person)  Thought Content:  WDL  Suicidal Thoughts:  Yes.  without intent/plan  Homicidal Thoughts:  No  Memory:  Immediate;   Fair Recent;   Fair Remote;   Fair  Judgement:  Impaired  Insight:  Fair  Psychomotor Activity:  Decreased  Concentration:  Fair  Recall:  [employer] of Knowledge:Fair  Language: Fair  Akathisia:  No  Handed:  Right  AIMS (if indicated):     Assets:  Communication Skills Desire for Improvement Financial Resources/Insurance Social Support  ADL's:  Intact  Cognition: WNL  Sleep:  Number of Hours: 6.5     Current Medications: Current Facility-Administered Medications  Medication Dose Route Frequency Provider Last Rate Last Dose  .  amitriptyline (ELAVIL) tablet 50 mg  50 mg Oral QHS Jalayna Josten B Abdulla Pooley, MD      . carbamazepine (TEGRETOL) tablet 200 mg  200 mg Oral BID Amere Bricco B Khylei Wilms, MD      . DULoxetine (CYMBALTA) DR capsule 60 mg  60 mg Oral Daily Shelbee Apgar B Sholanda Croson, MD   60 mg at 04/03/15 0901  . hydrOXYzine (ATARAX/VISTARIL) tablet 50 mg  50 mg Oral Q6H PRN Jimmy Footman, MD      . ibuprofen (ADVIL,MOTRIN) tablet 600 mg  600 mg Oral Q6H PRN Jimmy Footman, MD   600 mg at 04/03/15 2025  . magnesium hydroxide (MILK OF MAGNESIA) suspension 30 mL  30 mL Oral Daily PRN Jimmy Footman, MD      . nicotine (NICODERM CQ - dosed in mg/24 hours) patch 21 mg  21  mg Transdermal Daily Jimmy FootmanAndrea Hernandez-Gonzalez, MD   21 mg at 04/03/15 0901  . pregabalin (LYRICA) capsule 100 mg  100 mg Oral BID Shari ProwsJolanta B Carnell Beavers, MD   100 mg at 04/03/15 0901  . warfarin (COUMADIN) tablet 10 mg  10 mg Oral q1800 Shaylon Aden B Antoneo Ghrist, MD   10 mg at 04/03/15 1747  . Warfarin - Pharmacist Dosing Inpatient   Does not apply [id number] Shari ProwsJolanta B Savahanna Almendariz, MD        Lab Results:  Results for orders placed or performed during the hospital encounter of 04/01/15 (from the past 48 hour(s))  Protime-INR     Status: Abnormal   Collection Time: 04/02/15  7:19 AM  Result Value Ref Range   Prothrombin Time 15.7 (H) 11.4 - 15.0 seconds   INR 1.23   Protime-INR     Status: Abnormal   Collection Time: 04/03/15  7:08 AM  Result Value Ref Range   Prothrombin Time 17.2 (H) 11.4 - 15.0 seconds   INR 1.38   Hemoglobin     Status: None   Collection Time: 04/03/15  7:08 AM  Result Value Ref Range   Hemoglobin 14.5 12.0 - 16.0 g/dL    Physical Findings: AIMS: Facial and Oral Movements Muscles of Facial Expression: None, normal Lips and Perioral Area: None, normal Jaw: None, normal Tongue: None, normal,Extremity Movements Upper (arms, wrists, hands, fingers): None, normal Lower (legs, knees, ankles, toes): None, normal,  Trunk Movements Neck, shoulders, hips: None, normal, Overall Severity Severity of abnormal movements (highest score from questions above): None, normal Incapacitation due to abnormal movements: None, normal Patient's awareness of abnormal movements (rate only patient's report): No Awareness, Dental Status Current problems with teeth and/or dentures?: No Does patient usually wear dentures?: No  CIWA:    COWS:     Treatment Plan Summary: Daily contact with patient to assess and evaluate symptoms and progress in treatment and Medication management   Medical Decision Making:  New problem, with additional work up planned, Review of Psycho-Social Stressors (1), Review or order clinical lab tests (1), Review of Medication Regimen & Side Effects (2) and Review of New Medication or Change in Dosage (2)   Ms. Jennette Kettleeal is a 49 year old female with a history of depression and substance use who was admitted to the hospital after accidental overdose on drugs to discover that her girlfriend of 7 years died of a heroine overdose.  1. Suicidal ideation. The patient still has passive suicidal thoughts. She is able to contract for safety in the hospital.  2. Depression. She was started on Cymbalta 40 mg in April 2016 but has not been compliant with treatment. She was restarted on Cymbalta 40 mg in the emergency room. We will increase the dose to 60 mg. She was treated with Lithium in the past and is interested in mood stabilizer. Risks, benefits and SE of different medications were discussed. She sgreed to Tegretol.  3. Insomnia. She was started on Elavil 50 mg in the emergency room and responded well to it. We'll continue.  4. History of PE. She had PE in 2008, she has been on Coumadin ever since.  5. Chronic pain. This was in the past address with Lyrica we will restart lithium Lyrica 100 mg twice daily. She also uses Advil to address pain from broken feet.  6. Lupus. No flareup at the moment.  7. Alcohol  use. The patient has been drinking. She does not report any symptoms of alcohol withdrawal. We will monitor.  8. Substance abuse treatment. The patient admits to using alcohol and cocaine. She was positive for amphetamines as well on admission. She minimizes her problems and is not interested in residential substance abuse treatment program participation. Given the extend of abuse, 2 recent overdoses, and major loss related to drug and overdose and we feel that the patient needs to be transferred to [facility] rehabilitation facility on involuntary commitment.  9. Social. According to her sister the patient is homeless now has no place to go. She is too emotional to talk about discharge planning. We will address the issue as she gets better.  10. Smoking. She is a smoker nicotine products products aren't available.  11. Disposition. She will be discharged back to [facility] most likely with a sister. She will follow up with [facility]. Apparently she does not have medical provider at the moment. She wants to reestablish care with [facility] in [place].     Kristine Linea 04/03/2015, 8:28 PM

## 2015-04-03 NOTE — Progress Notes (Signed)
Recreation Therapy Notes  Date: 05.13.16 Time: 3:00 pm Location: Craft Room  Group Topic: Communication, Problem solving, Teamwork  Goal Area(s) Addresses:  Patient will work in teams towards shared goal. Patient will verbalize skills needed to make activity successful. Patient will verbalize benefit of using skills identified to reach post d/c goals.  Behavioral Response: Attentive, Interactive  Intervention: Landing Pad  Activity: Patients were given 12 straws and approximately 2.5 feet of tape and instructed to build a lnading pad to catch a golf ball that was dropped from approximately 4 feet.  Education: LRT educated patient on healthy support systems and why they are important.  Education Outcome: Acknowledges education/In group clarification offered  Clinical Observations/Feedback: Patient worked with peers towards shared goal. Patient used effective communication, problem solving, and Physiological scientistteamwork. Patient contributed to group discussion by stating that her team was successful and why, listing some skills she used in group, stating why these skills are important, and how to use these skills effectively when she d/c.   Jacquelynn CreeGreene,Nuel Dejaynes M, LRT/CTRS 04/03/2015 4:11 PM

## 2015-04-03 NOTE — Plan of Care (Signed)
Problem: [facility] Participation in Recreation Therapeutic Interventions Goal: STG-Patient will identify at least five coping skills for ** STG: Coping Skills - Within 4 treatment sessions, patient will verbalize at least 5 coping skills for substance abuse in each of 2 treatment sessions to decrease substance abuse post d/c.  Outcome: Progressing Treatment Session 1; Completed 1 out of 2: At approximately 12:45 pm, LRT met with patient in craft room. Patient verbalized 5 coping skills for substance abuse. LRT educated patient on leisure and why it is important to implement it into her schedule. LRT provided patient with blank schedules to help her plan her day and hopfully avoid using substances. LRT educated patient on healthy support systems and why they are important.  Leon[facility], LRT/CTRS 05.13.16 1:20 pm Goal: STG-Other Recreation Therapy Goal (Specify) STG: Stress Management - Within 5 treatment sessions, patient will demonstrate at least one stress management technique in each of 2 treatment sessions to increase stress management skills post d/c.  Outcome: Progressing Treatment Session 1; Completed 0 out of 2: At approximately 12:45 pm, LRT met with patient in craft room. LRT educated and provided patient with handouts on stress management techniques. Patient verbalized understanding. LRT encouraged patient to read over and practice the stress management techniques.  [facility], LRT/CTRS 05.13.16 1:22 pm  Problem: [facility] Participation in Recreation Therapeutic Interventions Goal: STG-Patient will identify at least five coping skills for ** STG: Coping Skills - Within 4 treatment sessions, patient will verbalize at least 5 coping skills for anger in each of 2 treatment sessions to increase anger management skills post d/c.  Outcome: Progressing Treatment Session 1; Completed 1 out of 2: At approximately 12:45 pm, LRT met with patient in craft room. Patient verbalized 5 coping skills  for anger. Patient reported treatment session was helpful.  [facility], LRT/CTRS 05.13.16 1:23 pm

## 2015-04-03 NOTE — Progress Notes (Signed)
   Patient pleasant and cooperative upon my assessment. Patient completed Patient Self Inventory, reports  appetite is good . Patient rates depression as 7  /10, patient rates hopeless feelings as6  /10. Patient denies SI/HI, denies A/V hallucinations.She was crying while talking to the staff states "I am a strong woman I could make it."Attended groups.Compliant with meds.

## 2015-04-04 LAB — PROTIME-INR
INR: 1.37
Prothrombin Time: 17.1 seconds — ABNORMAL HIGH (ref 11.4–15.0)

## 2015-04-04 MED ORDER — NICOTINE 10 MG IN INHA
RESPIRATORY_TRACT | Status: AC
  Administered 2015-04-04: 1 via RESPIRATORY_TRACT
  Filled 2015-04-04: qty 36

## 2015-04-04 MED ORDER — NICOTINE 10 MG IN INHA
1.0000 | RESPIRATORY_TRACT | Status: DC | PRN
  Administered 2015-04-04: 1 via RESPIRATORY_TRACT

## 2015-04-04 NOTE — Plan of Care (Signed)
Problem: Ineffective individual coping Goal: STG: Patient will remain free from self harm Outcome: Progressing Pt. Denies self-harm thoughts.

## 2015-04-04 NOTE — [facility] Group Notes (Signed)
[facility] LCSW Group Therapy  04/04/2015 2:34 PM  Type of Therapy:  Group Therapy  Participation Level:  Active  Participation Quality:  Appropriate and Attentive  Affect:  Appropriate  Cognitive:  Alert, Appropriate and Oriented  Insight:  Engaged and Supportive  Engagement in Therapy:  Engaged  Modes of Intervention:  Education, Paediatric nurseocialization and Support  Summary of Progress/Problems: Pt participated in group appropriately and shared that her superpower would be to "save people, I would have great powerful strength to save the world". Pt was able to relate to enabling and self-sabotaging behaviors in her past but is ready to move forward in her life and make necessary changes.   Crystal Frank, Laster Appling T 04/04/2015, 2:34 PM

## 2015-04-04 NOTE — [facility] Group Notes (Signed)
[facility] Group Notes:  (Nursing/MHT/Case Management/Adjunct)  Date:  04/04/2015  Time:  1:07 PM  Type of Therapy:  Group Therapy  Participation Level:  Active  Participation Quality:  Appropriate, Sharing and Supportive  Affect:  Appropriate  Cognitive:  Appropriate  Insight:  Appropriate  Engagement in Group:  Supportive  Modes of Intervention:  Support  Summary of Progress/Problems:  Crystal Frank 04/04/2015, 1:07 PM

## 2015-04-04 NOTE — Progress Notes (Signed)
Pt. Presents with pressured speech.  States she feels good today, denying depression.  Then states, "well to tell you the truth, I do feel a little depressed", rating it as a 3.  Denies SI/HI, A/V hallucinations.

## 2015-04-04 NOTE — Progress Notes (Signed)
[facility] MD Progress Note  04/04/2015 3:11 PM Crystal Frank  MRN:  [id number]  Subjective:  Crystal Frank reports feeling much better today. She reports good mood and bright affect. She is slightly too talkative for the situation. She slept well, appetite is fair. She already feels that Tegretol has been working but she started just last night. She tolerate Cymbalta well. She did not cry during the interview at all and reports only brief crying spells after losing her partner. She spoke with her sister and mother and they will be happy to have her over at least temporarily. No drugs allowed. They strongly feel that she needs rehabilitation. At the patient is rather gung ho about her inability to stop using substances. She is no longer suicidal homicidal. Energy and concentration are good. There are no somatic complaints.  Principal Problem: Major depressive disorder, recurrent severe without psychotic features Diagnosis:   Patient Active Problem List   Diagnosis Date Noted  . Major depressive disorder, recurrent severe without psychotic features [F33.2] 03/30/2015    Priority: High  . Alcohol use disorder severe. [F10.10] 04/03/2015    Priority: Medium  . Cocaine use disorder severe. [F14.10] 04/03/2015    Priority: Medium  . Stimulant use disorder. [F15.10] 04/03/2015    Priority: Medium  . Tobacco use disorder [Z72.0] 04/01/2015    Priority: Medium  . Clotting disorder [D68.9] 09/26/2012    Priority: Medium  . Drug overdose [T50.901A]   . Intoxication [R69]   . Polysubstance abuse [F19.10]   . Major depressive disorder, single episode, severe without psychotic features [F32.2]   . Suicide attempt by multiple drug overdose [T50.902A] 03/05/2015  . Adjustment disorder with mixed anxiety and depressed mood [F43.23] 03/05/2015  . MDD (major depressive disorder), single episode, severe [F32.2] 03/05/2015  . Chronic pain [G89.29] 09/27/2012  . 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 Osteomyelitis, L tibia [M86.9] 09/26/2012  . Lupus [M32.9]   . Encounter for long-term (current) use of anticoagulants [Z79.01] 04/19/2012  . 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 Pulmonary embolism [I26.99] 04/14/2012   Total Time spent with patient: 20 minutes   Past Medical History:  Past Medical History  Diagnosis Date  . Pulmonary embolism 2008    seen at [facility]--right lung  . Lupus   . Pleurisy   . 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 Arthritis   . Osteomyelitis   . Depression   . Anxiety   . Polysubstance abuse   . Chronic pain     Past Surgical History  Procedure Laterality Date  . Multiple orthopeadic surgeries      both ankles  . Ankle fusions      bilat fusions, multiple  . Hardware removal  09/25/2012    Procedure: HARDWARE REMOVAL;  Surgeon: Budd Palmer, MD;  Location: [facility] OR;  Service: Orthopedics;  Laterality: Left;  HARDWARE REMOVAL LEFT ANKLE   . Tibia osteotomy  09/25/2012    Procedure: TIBIAL OSTEOTOMY;  Surgeon: Budd Palmer, MD;  Location: [facility] OR;  Service: Orthopedics;;  partial excision of tibia, placement of non-biodegradeable drug delivery device, left tibia  . Ankle arthrodesis  10/23/2012  . Ankle fusion  10/23/2012    Procedure: ARTHRODESIS ANKLE;  Surgeon: Budd Palmer, MD;  Location: [facility] OR;  Service: Orthopedics;  Laterality: Left;  REMOVAL RETAINED ANTIBIOTIC NAIL LEFT TIBIA/TIBIO TALAR FUSION   Family History:  Family History  Problem Relation  Age of Onset  . Clotting disorder Paternal Aunt   . Cancer Maternal Uncle    Social History:  History  Alcohol Use  . 2.4 oz/week  . 4 Cans of beer per week    Comment: ocassionally     History  Drug Use  . Yes  . Special: Oxycodone, Cocaine, Marijuana    History   Social History  . Marital Status: Single    Spouse Name: N/A  . Number of Children: N/A  . Years of Education: N/A   Occupational History  .  diabled    Social History Main Topics  . Smoking status: Current Every Day Smoker -- 0.50 packs/day for 27 years    Types: Cigarettes  . Smokeless tobacco: Current User  . Alcohol Use: 2.4 oz/week    4 Cans of beer per week     Comment: ocassionally  . Drug Use: Yes    Special: Oxycodone, Cocaine, Marijuana  . Sexual Activity: Yes   Other Topics Concern  . None   Social History Narrative   Additional History:    Sleep: Good  Appetite:  Good   Assessment:   Musculoskeletal: Strength & Muscle Tone: within normal limits Gait & Station: normal Patient leans: N/A   Psychiatric Specialty Exam: Physical Exam  Nursing note and vitals reviewed.   Review of Systems  All other systems reviewed and are negative.   Blood pressure 139/96, pulse 93, temperature 97.9 F (36.6 C), temperature source Oral, resp. rate 18, height  (1.803 m), weight 94.348 kg (208 lb), last menstrual period 03/14/2015, SpO2 97 %.Body mass index is 29.02 kg/(m^2).  General Appearance: Casual  Eye Contact::  Good  Speech:  Normal Rate  Volume:  Normal  Mood:  Euthymic  Affect:  euphoric  Thought Process:  Logical  Orientation:  Full (Time, Place, and Person)  Thought Content:  WDL  Suicidal Thoughts:  No  Homicidal Thoughts:  No  Memory:  Immediate;   Fair Recent;   Fair Remote;   Fair  Judgement:  Impaired  Insight:  Lacking  Psychomotor Activity:  Normal  Concentration:  Fair  Recall:  [employer] of Knowledge:Fair  Language: Fair  Akathisia:  No  Handed:  Right  AIMS (if indicated):     Assets:  Communication Skills Desire for Improvement Financial Resources/Insurance Housing Social Support  ADL's:  Intact  Cognition: WNL  Sleep:  Number of Hours: 7.5     Current Medications: Current Facility-Administered Medications  Medication Dose Route Frequency Provider Last Rate Last Dose  . amitriptyline (ELAVIL) tablet 50 mg  50 mg Oral QHS Shari Prows, MD   50 mg at  04/03/15 2107  . carbamazepine (TEGRETOL) tablet 200 mg  200 mg Oral BID Shari Prows, MD   200 mg at 04/04/15 0814  . DULoxetine (CYMBALTA) DR capsule 60 mg  60 mg Oral Daily Yehonatan Grandison B Cassidey Barrales, MD   60 mg at 04/04/15 0911  . hydrOXYzine (ATARAX/VISTARIL) tablet 50 mg  50 mg Oral Q6H PRN Jimmy Footman, MD      . ibuprofen (ADVIL,MOTRIN) tablet 600 mg  600 mg Oral Q6H PRN Jimmy Footman, MD   600 mg at 04/04/15 0815  . magnesium hydroxide (MILK OF MAGNESIA) suspension 30 mL  30 mL Oral Daily PRN Jimmy Footman, MD      . nicotine (NICODERM CQ - dosed in mg/24 hours) patch 21 mg  21 mg Transdermal Daily Jimmy Footman, MD   21 mg  at 04/03/15 0901  . nicotine (NICOTROL) 10 MG inhaler 1 continuous puffing  1 continuous puffing Inhalation PRN Raynetta Osterloh B Danne Scardina, MD   1 continuous puffing at 04/04/15 1310  . pregabalin (LYRICA) capsule 100 mg  100 mg Oral BID Shari ProwsJolanta B Derril Franek, MD   100 mg at 04/04/15 0912  . warfarin (COUMADIN) tablet 10 mg  10 mg Oral q1800 Akin Yi B Ernst Cumpston, MD   10 mg at 04/03/15 1747  . Warfarin - Pharmacist Dosing Inpatient   Does not apply [id number] Shari ProwsJolanta B Lateria Alderman, MD        Lab Results:  Results for orders placed or performed during the hospital encounter of 04/01/15 (from the past 48 hour(s))  Protime-INR     Status: Abnormal   Collection Time: 04/03/15  7:08 AM  Result Value Ref Range   Prothrombin Time 17.2 (H) 11.4 - 15.0 seconds   INR 1.38   Hemoglobin     Status: None   Collection Time: 04/03/15  7:08 AM  Result Value Ref Range   Hemoglobin 14.5 12.0 - 16.0 g/dL  Protime-INR     Status: Abnormal   Collection Time: 04/04/15 12:06 PM  Result Value Ref Range   Prothrombin Time 17.1 (H) 11.4 - 15.0 seconds   INR 1.37     Physical Findings: AIMS: Facial and Oral Movements Muscles of Facial Expression: None, normal Lips and Perioral Area: None, normal Jaw: None, normal Tongue: None,  normal,Extremity Movements Upper (arms, wrists, hands, fingers): None, normal Lower (legs, knees, ankles, toes): None, normal, Trunk Movements Neck, shoulders, hips: None, normal, Overall Severity Severity of abnormal movements (highest score from questions above): None, normal Incapacitation due to abnormal movements: None, normal Patient's awareness of abnormal movements (rate only patient's report): No Awareness, Dental Status Current problems with teeth and/or dentures?: No Does patient usually wear dentures?: No  CIWA:    COWS:     Treatment Plan Summary: Daily contact with patient to assess and evaluate symptoms and progress in treatment and Medication management   Medical Decision Making:  Established Problem, Stable/Improving (1), Review of Psycho-Social Stressors (1), Review or order clinical lab tests (1), Review of Medication Regimen & Side Effects (2) and Review of New Medication or Change in Dosage (2)   Ms. Jennette Kettleeal is a 49 year old female with a history of depression and substance use who was admitted to the hospital after accidental overdose on drugs to discover that her girlfriend of 7 years died of a heroine overdose.  1. Suicidal ideation. The patient is able to contract for safety in the hospital.  2. Depression. She was started on Cymbalta 40 mg in April 2016 but has not been compliant with treatment. She takes Cymbalta 40 mg for depression and Tegretol 200 mg twice daily for mood stabilization.  3. Insomnia. She was started on Elavil 50 mg with improvement.   4. History of PE. On Coumadin since 2008,   5. Chronic pain. This was in the past address with Lyrica we will restart lithium Lyrica 100 mg twice daily. She also uses Advil to address pain from broken feet.  6. Lupus. No flareup at the moment.  7. Alcohol use. The patient has been drinking. She does not report any symptoms of alcohol withdrawal. Vital signs are stable.   8. Substance abuse treatment. The  patient admits to using alcohol, cannabis and cocaine. She was positive for amphetamines as well on admission. She minimizes her problems and is not interested in residential substance abuse treatment  program participation. Given the extend of abuse, 2 recent overdoses, and major loss related to drug and overdose and we feel that the patient needs to be transferred to [facility] rehabilitation facility on involuntary commitment.  9. Social. She may stay with her sister and mother if not using drugs.   10. Smoking. Nicotine products products aren't available.  11. Disposition. TBE. [facility] if accepte or home with family. She will follow up with [facility]. Apparently she does not have medical provider at the moment. She wants to reestablish care with [facility] in [place].      Kristine LineaUCILOWSKA, Camesha Farooq 04/04/2015, 3:11 PM

## 2015-04-04 NOTE — Progress Notes (Signed)
Pt. Has been interacting with peers.  Attending groups.  Appetite good.  Medicated once for complaints of chronic pain. Taking prescribed meds.

## 2015-04-04 NOTE — [facility] Group Notes (Signed)
[facility] Group Notes:  (Nursing/MHT/Case Management/Adjunct)  Date:  04/04/2015  Time:  9:27 PM  Type of Therapy:  Group Therapy  Participation Level:  Active  Participation Quality:  Appropriate  Affect:  Appropriate  Cognitive:  Appropriate  Insight:  Good  Engagement in Group:  Engaged  Modes of Intervention:  Discussion  Summary of Progress/Problems:  Crystal Frank 04/04/2015, 9:27 PM

## 2015-04-04 NOTE — Progress Notes (Signed)
ANTICOAGULATION CONSULT NOTE - Follow Up  Pharmacy Consult for Warfarin Indication: Hx PE, lupus  No Known Allergies  Patient Measurements: Height: 5\' 11"  (180.3 cm) Weight: 208 lb (94.348 kg) IBW/kg (Calculated) : 70.8  Vital Signs: Temp: 97.9 F (36.6 C) (05/14 0655) Temp Source: Oral (05/14 0655) BP: 139/96 mmHg (05/14 0655) Pulse Rate: 93 (05/14 0655)  Labs:  Recent Labs  04/02/15 0719 04/03/15 0708 04/04/15 1206  HGB  --  14.5  --   LABPROT 15.7* 17.2* 17.1*  INR 1.23 1.38 1.37    Estimated Creatinine Clearance: 102.4 mL/min (by C-G formula based on Cr of 0.86).   Medical History: Past Medical History  Diagnosis Date  . Pulmonary embolism 2008    seen at [facility]--right lung  . Lupus   . Pleurisy   . 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 Arthritis   . Osteomyelitis   . Depression   . Anxiety   . Polysubstance abuse   . Chronic pain     Medications:  Home dose warfarin: 15mg  MWF and 12.5mg  T/R/S/S reported per patient.  Last dose 5/7 at 2000.  Per last Anticoag visit on 5/4, warfarin dose was 12.5mg  MWF, 10mg  T/R/S/S.    Assessment: 49 yoF on chronic anticoagulation for hx PE and lupus, also PMHx OM, arthritis, depression, and anxiety presents with OD on EtOH, lyrica, APAP, and klonopin.  INR supratherapeutic on admission.  Pharmacy consulted to resume warfarin dosing inpatient.  Today, 04/04/2015:     INR now subtherapeutic after being > 5 on 5/10 with doses held   5/12 received Coumadin 12.5 mg  5/13 10mg  given  Goal of Therapy:  INR 2-3 Monitor platelets by anticoagulation protocol: Yes   Plan:  Will continue Warfarin 10 mg tonight per home regimen (currently ordered as 10 mg daily) as likely full affects of previous doses not seen  Will check INR with AM labs  Garlon HatchetJody Jaqlyn Gruenhagen, PharmD 04/04/2015 2:51 PM

## 2015-04-04 NOTE — Progress Notes (Signed)
D: Patient denies SI/HI/AVH. Patient affect and mood are depressed and anxious.  Patient did attend evening group. Patient visible on the milieu. No distress noted. A: Support and encouragement offered. Scheduled medications given to pt. Q 15 min checks continued for patient safety. R: Patient receptive. Patient remains safe on the unit.

## 2015-04-05 LAB — PROTIME-INR
INR: 1.35
Prothrombin Time: 16.9 seconds — ABNORMAL HIGH (ref 11.4–15.0)

## 2015-04-05 MED ORDER — WARFARIN SODIUM 2.5 MG PO TABS
12.5000 mg | ORAL_TABLET | Freq: Every day | ORAL | Status: DC
  Administered 2015-04-05 – 2015-04-06 (×2): 12.5 mg via ORAL
  Filled 2015-04-05 (×3): qty 1

## 2015-04-05 NOTE — Progress Notes (Signed)
Patient denies feeling depressed or anxious; denies any SI and contracts for safety; denies any hallucinations; has been calm, pleasant, and cooperative throughout the shift; continues to complain of neck, back, and feet pain; no further voiced complaints; continue to monitor.

## 2015-04-05 NOTE — [facility] Group Notes (Signed)
[facility] LCSW Group Therapy  04/05/2015 2:44 PM  Type of Therapy:  Group Therapy  Participation Level:  Active  Participation Quality:  Attentive  Affect:  Irritable  Cognitive:  Appropriate  Insight:  Engaged  Engagement in Therapy:  Developing/Improving  Modes of Intervention:  Clarification, Discussion, Education, Exploration, Problem-solving and Support :LCSW introduced group rules and gave suicide intervention and prevention handouts to patients. LCSW asked each patient to openly review if what they would do if the became suicidal. Patient was supported by LCSW and peer discussion ensued.  LCSW and pt were able to come to an understanding as this patient discussed her own situation. Another peer in the group was very immature and patient with LCSW was supported during her disclosure. Patient disclosed a lot of insightful information to support her peers. She will call her Mom or sister if she ever feels suicidal. She stated that holding onto grudges was something she no longer does. It takes up too much energy or time.   Summary of Progress/Problems:  Cheron SchaumannBandi, Talina Pleitez M 04/05/2015, 2:44 PM

## 2015-04-05 NOTE — Progress Notes (Signed)
D) Patient pleasant and cooperative upon my assessment. Patient completed Self Inventory assessment and   Patient's affect is flat and mood is liable and sad.  Patient rates depression as 7/10, patient rates hopeless feelings as 7/10. Patient denies SI/HI, denies A/V hallucinations. Reports that her appetite is good.  A) Patient offered support and encouragement, patient encouraged to discuss feelings/concerns with staff. Patient verbalized understanding. Patient monitored Q15 minutes for safety. Patient met with MD  to discuss today's goals and plan of care.  R) Patient visible in milieu, attending groups and spending time in the day room with her peers. Patient appropriate with staff and peers.   Patient taking medications as ordered. Will continue to monitor.

## 2015-04-05 NOTE — [facility] Group Notes (Signed)
[facility] Group Notes:  (Nursing/MHT/Case Management/Adjunct)  Date:  04/05/2015  Time:  10:52 AM  Type of Therapy:  Group Therapy  Participation Level:  Active  Participation Quality:  Appropriate, Sharing and Supportive  Affect:  Appropriate  Cognitive:  Appropriate  Insight:  Appropriate  Engagement in Group:  Engaged and Improving  Modes of Intervention:  Education  Summary of Progress/Problems:  Crystal RombergRegina Crystal Frank 04/05/2015, 10:52 AM

## 2015-04-05 NOTE — Plan of Care (Signed)
Problem: Ineffective individual coping Goal: LTG: Patient will report a decrease in negative feelings Outcome: Progressing Patient denies any SI at present; contracts for safety.
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
## 2015-04-05 NOTE — Progress Notes (Signed)
ANTICOAGULATION CONSULT NOTE - Follow Up  Pharmacy Consult for Warfarin Indication: Hx PE, lupus  No Known Allergies  Patient Measurements: Height: 5\' 11"  (180.3 cm) Weight: 208 lb (94.348 kg) IBW/kg (Calculated) : 70.8  Vital Signs: Temp: 97.7 F (36.5 C) (05/15 0500) Temp Source: Oral (05/15 0500) BP: 143/87 mmHg (05/15 0500) Pulse Rate: 77 (05/15 0500)  Labs:  Recent Labs  04/03/15 0708 04/04/15 1206 04/05/15 0645  HGB 14.5  --   --   LABPROT 17.2* 17.1* 16.9*  INR 1.38 1.37 1.35    Estimated Creatinine Clearance: 102.4 mL/min (by C-G formula based on Cr of 0.86).   Medical History: Past Medical History  Diagnosis Date  . Pulmonary embolism 2008    seen at [facility]--right lung  . Lupus   . Pleurisy   . 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 Arthritis   . Osteomyelitis   . Depression   . Anxiety   . Polysubstance abuse   . Chronic pain     Medications:  Home dose warfarin: 15mg  MWF and 12.5mg  T/R/S/S reported per patient.  Last dose 5/7 at 2000.  Per last Anticoag visit on 5/4, warfarin dose was 12.5mg  MWF, 10mg  T/R/S/S.  Warfarin resumed 5/11, given 12.5mg  x 1 then 10mg  daily  Pt started carbamazepine 200mg  BID on 5/13   Assessment: 49 yoF on chronic anticoagulation for hx PE and lupus, also PMHx OM, arthritis, depression, and anxiety presents with OD on EtOH, lyrica, APAP, and klonopin.  INR supratherapeutic on admission.  Pharmacy consulted to resume warfarin dosing inpatient.  Subtherapeutic  Goal of Therapy:  INR 2-3 Monitor platelets by anticoagulation protocol: Yes   Plan:  Will increase warfarin to 12.5mg  daily and recheck INR with daily labs.   Carbamazepine DDI may reduce effectiveness of warfarin, may need further increases to achieve therapeutic INR.   Pharmacy to follow and adjust   Garlon HatchetJody Arletta Lumadue, PharmD 04/05/2015 2:33 PM

## 2015-04-05 NOTE — Tx Team (Signed)
Interdisciplinary Treatment Plan Update (Adult)  Date:  04/05/2015 Time Reviewed:  10:47 AM  Progress in Treatment: Attending groups: Yes. Participating in groups:  Yes. Taking medication as prescribed:  Yes. Tolerating medication:  Yes. Family/Significant othe contact made:  Yes, individual(s) contacted:  Sister and mother Patient understands diagnosis:  Yes. Discussing patient identified problems/goals with staff:  Yes. Medical problems stabilized or resolved:  Yes. Denies suicidal/homicidal ideation: Yes. Issues/concerns per patient self-inventory:  No. Other:  New problem(s) identified: No, Describe:     Discharge Plan or Barriers:  Reason for Continuation of Hospitalization: Depression  Comments: Patient has identified recent loss of friend and reports she is looking forward to relocating to [place] and to be supported by Mom and sister and old child hood friends. She wishes to continue seeing  Psychiatrist Dr Carmon GinsbergF in [place].  Estimated length of stay: 5 days  New goal(s): TBD  Review of initial/current patient goals per problem list:   Refer to plan of care  Attendees: Patient:  Crystal Frank 5/15/201610:47 AM  Family:   5/15/201610:47 AM  Physician:   5/15/201610:47 AM  Nursing:    5/15/201610:47 AM  Case Manager:   5/15/201610:47 AM  Counselor:  Arrie Senatelaudine Kierre Deines LCSW 5/15/201610:47 AM  Other:   5/15/201610:47 AM  Other:   5/15/201610:47 AM  Other:   5/15/201610:47 AM  Other:  5/15/201610:47 AM  Other:  5/15/201610:47 AM  Other:  5/15/201610:47 AM  Other:  5/15/201610:47 AM  Other:  5/15/201610:47 AM  Other:  5/15/201610:47 AM  Other:   5/15/201610:47 AM   Scribe for Treatment Team:   Cheron SchaumannBandi, Eleyna Brugh M, 04/05/2015, 10:47 AM

## 2015-04-05 NOTE — Progress Notes (Signed)
[facility] MD Progress Note  04/05/2015 11:50 AM MARGERET STACHNIK  MRN:  [id number]  Subjective:  Ms. Manders reports improved mood in spite of the fact that today is a funeral service for her girlfriend who died of a heroine overdose. She believes that her partner died so she can survive. She decided to stop using substances. She wants to move in with her sister and mother and continue recovery. She admits that a month ago her girlfriend and the patient had a suicide pact and she overdosed with an intention to die. Her most recent overdose however was accidental overdose. She tolerates medications well and intends to continue in the community. She denies suicidal or homicidal ideation. There are no somatic symptoms. Sleep and appetite are fair. She participates eagerly in groups. We made the referral to [facility] rehabilitation facility. We do not know if the patient has been accepted.  Principal Problem: Major depressive disorder, recurrent severe without psychotic features Diagnosis:   Patient Active Problem List   Diagnosis Date Noted  . Major depressive disorder, recurrent severe without psychotic features [F33.2] 03/30/2015    Priority: High  . Alcohol use disorder severe. [F10.10] 04/03/2015    Priority: Medium  . Cocaine use disorder severe. [F14.10] 04/03/2015    Priority: Medium  . Stimulant use disorder. [F15.10] 04/03/2015    Priority: Medium  . Tobacco use disorder [Z72.0] 04/01/2015    Priority: Medium  . Clotting disorder [D68.9] 09/26/2012    Priority: Medium  . Drug overdose [T50.901A]   . Intoxication [R69]   . Polysubstance abuse [F19.10]   . Major depressive disorder, single episode, severe without psychotic features [F32.2]   . Suicide attempt by multiple drug overdose [T50.902A] 03/05/2015  . Adjustment disorder with mixed anxiety and depressed mood [F43.23] 03/05/2015  . MDD (major depressive disorder), single episode, severe [F32.2] 03/05/2015  . Chronic pain [G89.29]  09/27/2012  . 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 Osteomyelitis, L tibia [M86.9] 09/26/2012  . Lupus [M32.9]   . Encounter for long-term (current) use of anticoagulants [Z79.01] 04/19/2012  . 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 Pulmonary embolism [I26.99] 04/14/2012   Total Time spent with patient: 20 minutes   Past Medical History:  Past Medical History  Diagnosis Date  . Pulmonary embolism 2008    seen at [facility]--right lung  . Lupus   . Pleurisy   . 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 Arthritis   . Osteomyelitis   . Depression   . Anxiety   . Polysubstance abuse   . Chronic pain     Past Surgical History  Procedure Laterality Date  . Multiple orthopeadic surgeries      both ankles  . Ankle fusions      bilat fusions, multiple  . Hardware removal  09/25/2012    Procedure: HARDWARE REMOVAL;  Surgeon: Budd Palmer, MD;  Location: [facility] OR;  Service: Orthopedics;  Laterality: Left;  HARDWARE REMOVAL LEFT ANKLE   . Tibia osteotomy  09/25/2012    Procedure: TIBIAL OSTEOTOMY;  Surgeon: Budd Palmer, MD;  Location: [facility] OR;  Service: Orthopedics;;  partial excision of tibia, placement of non-biodegradeable drug delivery device, left tibia  . Ankle arthrodesis  10/23/2012  . Ankle fusion  10/23/2012    Procedure: ARTHRODESIS ANKLE;  Surgeon: Budd Palmer, MD;  Location: [facility] OR;  Service: Orthopedics;  Laterality: Left;  REMOVAL RETAINED ANTIBIOTIC NAIL LEFT TIBIA/TIBIO TALAR  FUSION   Family History:  Family History  Problem Relation Age of Onset  . Clotting disorder Paternal Aunt   . Cancer Maternal Uncle    Social History:  History  Alcohol Use  . 2.4 oz/week  . 4 Cans of beer per week    Comment: ocassionally     History  Drug Use  . Yes  . Special: Oxycodone, Cocaine, Marijuana    History   Social History  . Marital Status: Single    Spouse Name: N/A  . Number of  Children: N/A  . Years of Education: N/A   Occupational History  . diabled    Social History Main Topics  . Smoking status: Current Every Day Smoker -- 0.50 packs/day for 27 years    Types: Cigarettes  . Smokeless tobacco: Current User  . Alcohol Use: 2.4 oz/week    4 Cans of beer per week     Comment: ocassionally  . Drug Use: Yes    Special: Oxycodone, Cocaine, Marijuana  . Sexual Activity: Yes   Other Topics Concern  . None   Social History Narrative   Additional History:    Sleep: Good  Appetite:  Good   Assessment:   Musculoskeletal: Strength & Muscle Tone: within normal limits Gait & Station: normal Patient leans: N/A   Psychiatric Specialty Exam: Physical Exam  Nursing note and vitals reviewed.   Review of Systems  All other systems reviewed and are negative.   Blood pressure 143/87, pulse 77, temperature 97.7 F (36.5 C), temperature source Oral, resp. rate 18, height 5\' 11"  (1.803 m), weight 94.348 kg (208 lb), last menstrual period 03/14/2015, SpO2 97 %.Body mass index is 29.02 kg/(m^2).  General Appearance: Casual  Eye Contact::  Good  Speech:  Normal Rate  Volume:  Normal  Mood:  Euthymic  Affect:  Appropriate  Thought Process:  Goal Directed, Linear and Logical  Orientation:  Full (Time, Place, and Person)  Thought Content:  WDL  Suicidal Thoughts:  No  Homicidal Thoughts:  No  Memory:  Immediate;   Fair Recent;   Fair Remote;   Fair  Judgement:  Fair  Insight:  Fair  Psychomotor Activity:  Normal  Concentration:  Fair  Recall:  Fis[employer]Fair  Fund of Knowledge:Fair  Language: Fair  Akathisia:  No  Handed:  Right  AIMS (if indicated):     Assets:  Communication Skills Desire for Improvement Financial Resources/Insurance Physical Health Resilience Social Support  ADL's:  Intact  Cognition: WNL  Sleep:  Number of Hours: 7.45     Current Medications: Current Facility-Administered Medications  Medication Dose Route Frequency Provider  Last Rate Last Dose  . amitriptyline (ELAVIL) tablet 50 mg  50 mg Oral QHS Shari ProwsJolanta B Oluwadamilare Tobler, MD   50 mg at 04/04/15 2129  . carbamazepine (TEGRETOL) tablet 200 mg  200 mg Oral BID Shari ProwsJolanta B Hershy Flenner, MD   200 mg at 04/05/15 0841  . DULoxetine (CYMBALTA) DR capsule 60 mg  60 mg Oral Daily Lodema Parma B Rickiya Picariello, MD   60 mg at 04/05/15 0947  . hydrOXYzine (ATARAX/VISTARIL) tablet 50 mg  50 mg Oral Q6H PRN Jimmy FootmanAndrea Hernandez-Gonzalez, MD      . ibuprofen (ADVIL,MOTRIN) tablet 600 mg  600 mg Oral Q6H PRN Jimmy FootmanAndrea Hernandez-Gonzalez, MD   600 mg at 04/05/15 0843  . magnesium hydroxide (MILK OF MAGNESIA) suspension 30 mL  30 mL Oral Daily PRN Jimmy FootmanAndrea Hernandez-Gonzalez, MD      . nicotine (NICODERM CQ - dosed in  mg/24 hours) patch 21 mg  21 mg Transdermal Daily Jimmy FootmanAndrea Hernandez-Gonzalez, MD   21 mg at 04/03/15 0901  . nicotine (NICOTROL) 10 MG inhaler 1 continuous puffing  1 continuous puffing Inhalation PRN Janeen Watson B Sergey Ishler, MD   1 continuous puffing at 04/04/15 1310  . pregabalin (LYRICA) capsule 100 mg  100 mg Oral BID Shari ProwsJolanta B Rae Plotner, MD   100 mg at 04/05/15 0946  . warfarin (COUMADIN) tablet 10 mg  10 mg Oral q1800 Henessy Rohrer B Sada Mazzoni, MD   10 mg at 04/04/15 1719  . Warfarin - Pharmacist Dosing Inpatient   Does not apply [id number] Shari ProwsJolanta B Pellegrino Kennard, MD        Lab Results:  Results for orders placed or performed during the hospital encounter of 04/01/15 (from the past 48 hour(s))  Protime-INR     Status: Abnormal   Collection Time: 04/04/15 12:06 PM  Result Value Ref Range   Prothrombin Time 17.1 (H) 11.4 - 15.0 seconds   INR 1.37   Protime-INR     Status: Abnormal   Collection Time: 04/05/15  6:45 AM  Result Value Ref Range   Prothrombin Time 16.9 (H) 11.4 - 15.0 seconds   INR 1.35     Physical Findings: AIMS: Facial and Oral Movements Muscles of Facial Expression: None, normal Lips and Perioral Area: None, normal Jaw: None, normal Tongue: None, normal,Extremity  Movements Upper (arms, wrists, hands, fingers): None, normal Lower (legs, knees, ankles, toes): None, normal, Trunk Movements Neck, shoulders, hips: None, normal, Overall Severity Severity of abnormal movements (highest score from questions above): None, normal Incapacitation due to abnormal movements: None, normal Patient's awareness of abnormal movements (rate only patient's report): No Awareness, Dental Status Current problems with teeth and/or dentures?: No Does patient usually wear dentures?: No  CIWA:    COWS:     Treatment Plan Summary: Daily contact with patient to assess and evaluate symptoms and progress in treatment and Medication management   Medical Decision Making:  Established Problem, Stable/Improving (1), Review of Psycho-Social Stressors (1), Review or order clinical lab tests (1) and Review of Medication Regimen & Side Effects (2)   Ms. Jennette Kettleeal is a 49 year old female with a history of depression and substance use who was admitted to the hospital after accidental overdose on drugs to discover that her girlfriend of 7 years died of a heroine overdose.  1. Suicidal ideation. This has resolved. The patient is able to contract for safety in the hospital.  2. Depression. She was started on Cymbalta 40 mg in April 2016 but has not been compliant with treatment. She takes Cymbalta 60 mg for depression and Tegretol 200 mg twice daily for mood stabilization.  3. Insomnia. She was started on Elavil 50 mg with improvement.   4. History of PE. On Coumadin since 2008,   5. Chronic pain. This was in the past address with Lyrica we will restart lithium Lyrica 100 mg twice daily. She also uses Advil to address pain from broken feet.  6. Lupus. No flareup at the moment.  7. Alcohol use. The patient has been drinking. She does not report any symptoms of alcohol withdrawal. Vital signs are stable.   8. Substance abuse treatment. The patient admits to using alcohol, cannabis and  cocaine. She was positive for amphetamines as well on admission. She minimizes her problems and is not interested in residential substance abuse treatment program participation. Given the extend of abuse, 2 recent overdoses, and major loss related to drug and overdose  and we feel that the patient needs to be transferred to [facility] rehabilitation facility on involuntary commitment.  9. Social. She may stay with her sister and mother if not using drugs.   10. Smoking. Nicotine products products aren't available.  11. Disposition. TBE. [facility] if accepte or home with family. She will follow up with [facility]. Apparently she does not have medical provider at the moment. She wants to reestablish care with [facility] in [place].      Kristine Linea 04/05/2015, 11:50 AM

## 2015-04-05 NOTE — Plan of Care (Signed)
Problem: Consults Goal: Suicide Risk Patient Education (See Patient Education module for education specifics)  Outcome: Progressing Patient denies any thoughts of suicidal thoughts at present.

## 2015-04-06 LAB — PROTIME-INR
INR: 1.28
Prothrombin Time: 16.2 seconds — ABNORMAL HIGH (ref 11.4–15.0)

## 2015-04-06 MED ORDER — PREGABALIN 100 MG PO CAPS: 100.0000 mg | ORAL_CAPSULE | Freq: Two times a day (BID) | ORAL | Status: DC

## 2015-04-06 MED ORDER — AMITRIPTYLINE HCL 50 MG PO TABS: 50.0000 mg | ORAL_TABLET | Freq: Every day | ORAL | Status: DC

## 2015-04-06 MED ORDER — DULOXETINE HCL 60 MG PO CPEP: 60.0000 mg | ORAL_CAPSULE | Freq: Every day | ORAL | Status: DC

## 2015-04-06 MED ORDER — CARBAMAZEPINE 200 MG PO TABS: 200.0000 mg | ORAL_TABLET | Freq: Two times a day (BID) | ORAL | Status: DC

## 2015-04-06 NOTE — Progress Notes (Signed)
  [facility] Discharge Plan :  Will you be returning to the same living situation after discharge:  No. At discharge, do you have transportation home?: Yes,  Sister to pick up Do you have the ability to pay for your medications: Yes,  Medicare  Release of information consent forms completed and in the chart;  Patient's signature needed at discharge.  Patient to Follow up at: Follow-up Information    Follow up with Dr. Hurley CiscoBarbara Fousek. Go on 04/16/2015.   Why:  Hospital Follow up, Therapy, Outpatient Medication Management, Please take your photo ID and Insurance card  *After meeting with therapist you will be added to Dr. Carie CaddyKaur's schedule*   Contact information:   [address] [place], [place] [address] [phone]; fax-[phone]      Patient denies SI/HI: Yes  Safety Planning and Suicide Prevention discussed: Yes,  with sister and patient  Have you used any form of tobacco in the last 30 days? (Cigarettes, Smokeless Tobacco, Cigars, and/or Pipes): Yes  Has patient been referred to the Quitline?: Patient refused referral  Glennon MacLaws, Hannia Matchett P, LCSW 04/06/2015, 2:46 PM

## 2015-04-06 NOTE — Progress Notes (Signed)
Patient ID: Crystal ButtsJennifer L Meiring, female   DOB: [birth year], 49 y.o.   MRN: [id number] SW spoke with Pt's sister Sharen HonesKatie Stamp [phone]. She confirms that Pt is welcome to return home whenever she is discharged. She verbalizes that she and Pt will be moving to [place] within the next week. She requests assistance with Pt's aftercare plan. SW informed her of Pt's plan to receive therapy and medication management with Dr. Milagros Evenerupinder Kaur and Dr. Milon DikesFousek. She verbalizes some concern about this and SW informed her that Pt has refused referral [facility]. Ms. Jennette Kettleeal was provided Suicide Education and advised of other contacts for [facility] should Pt change her mind. She verbalizes that she will pick up the Pt whenever she is discharged. She verbalizes that she will probably have to pick up Pt after her boyfriend gets off work so that she will have transportation.  Ms. Jennette Kettleeal says that her sister sounds more like herself, that she has been bubbly and a "go-getter"  And that she sounds more like the "Victorino DikeJennifer from 15 years ago."  She verbalizes some concern that her enthusiasm may be exaggerated and "too good to be true".  She overall feels she is improved and is comfortable with her discharge today.  She verbalizes that Pt continues to maintain that she does not have an addiction and that the people she was with were the main problem.  She verbalizes that she feels Pt will be in a much more supportive environment and will do her best to support her.

## 2015-04-06 NOTE — Progress Notes (Deleted)
Patient ID: Crystal ButtsJennifer L Frank, female   DOB: Dec 19, 1965, 49 y.o.   MRN: [id number] SW spoke with Pt's sister Crystal HonesKatie Frank [phone].  She confirms that Pt is welcome to return home whenever she is discharged.  She verbalizes that she and [place] will be moving to [place] the next week.  She requests assistance with Pt's aftercare plan.  SW informed her of Pt's plan to receive therapy and medication management with Dr. Milagros Evenerupinder Kaur and Dr. Milon DikesFousek.  She verbalizes some concern about this and SW informed her that Pt has refused referral to [facility].  Ms. Crystal Frank was provided Suicide Education and advised of other contacts for [facility] should Pt change her mind. She verbalizes that she will pick up the Pt whenever she is discharged. She verbalizes that she will probably have to pick up Pt after her boyfriend gets off work so that she will have transportation.

## 2015-04-06 NOTE — [facility] Group Notes (Signed)
[facility] LCSW Group Therapy  04/06/2015 5:16 PM  Type of Therapy:  Group Therapy  Participation Level:  Active  Participation Quality:  Attentive  Affect:  Appropriate  Cognitive:  Appropriate  Insight:  Engaged  Engagement in Therapy:  Improving  Modes of Intervention:  Discussion, Education, Exploration and Problem-solving  Summary of Progress/Problems:Summary of Progress/Problems:LCSW  Introduced group rules and all patients were asked to be supportive of their peers. LCSW introduced todays topic " Overcoming obstacles" Patients were asked to identify their obstacles as a collective group and then to problem solve in a team work fashion to reduce the obstacles and barriers they each face. LCSW facilitated peer to peer support and made suggestions to assist patients with their obstacles.  This patient identified self sabotage and issue she faces. She was supported with coping strategies that are healthier that turning to substances. Pt was very clear in her thinking and was able to contribute a lot to her peers.    Crystal Frank, Levent Kornegay M 04/06/2015, 5:16 PM
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
## 2015-04-06 NOTE — Progress Notes (Signed)
Discharge Note:   Patient has been oriented x 4.  Affect has been bright.  She reports that she "needs to go home and grieve in my own way".  Patient denies any suicidal thoughts when asked multiple times. Patient also denied any need for substance abuse follow up. She stated, "I know where to go if I need help".  Patient's belongings were all returned to her and belonging return forms signed. Patient was given rx for elavil, tegretol,cymbalta and lyrica.  She verbalized understanding of medications and appropriate schedule.   Patient was escorted by staff off the unit. Her sister is here to take her home. Patient was given crisis healthcare  Card and instructed to call if she needed any help. She verbalized understanding of this. Patient denied need for smoking cessation instructions or plan.

## 2015-04-06 NOTE — Progress Notes (Signed)
Patient continues to deny feeling depressed or anxious; denies any SI and contracts for safety; denies any hallucinations; has been pleasant and cooperative throughout the shift; observed smiling and laughing frequently; continue to monitor.

## 2015-04-06 NOTE — Progress Notes (Signed)
Recreation Therapy Notes  INPATIENT RECREATION TR PLAN  Patient Details Name: Crystal Frank MRN: [id number] DOB: 1966-08-01 Today's Date: 04/06/2015  Rec Therapy Plan Is patient appropriate for Therapeutic Recreation?: Yes Treatment times per week: At least 3 times a week TR Treatment/Interventions: 1:1 session, Group participation (Comment) (Appropriate participation in daily recreation therapy tx)  Discharge Criteria Pt will be discharged from therapy if:: Discharged Treatment plan/goals/alternatives discussed and agreed upon by:: Patient/family  Discharge Summary Short term goals set: See Care Plan Short term goals met: Complete, Adequate for discharge Progress toward goals comments: One-to-one attended Which groups?: Communication, Social skills, Other (Comment) (Self-expression) One-to-one attended: Stress management and coping skills Reason goals not met: Patient planning to d/c before goal could met Therapeutic equipment acquired: None Reason patient discharged from therapy: Discharge from hospital Pt/family agrees with progress & goals achieved: Yes Date patient discharged from therapy: 04/06/15   Leonette [facility], LRT/CTRS 04/06/2015, 4:32 PM

## 2015-04-06 NOTE — [facility] Suicide Risk Assessment (Signed)
[facility] INPATIENT:  Family/Significant Other Suicide Prevention Education  Suicide Prevention Education:  Education Completed; Sharen HonesKatie Zayed, Pt's sister,  (name of family member/significant other) has been identified by the patient as the family member/significant other with whom the patient will be residing, and identified as the person(s) who will aid the patient in the event of a mental health crisis (suicidal ideations/suicide attempt).  With written consent from the patient, the family member/significant other has been provided the following suicide prevention education, prior to the and/or following the discharge of the patient.  The suicide prevention education provided includes the following:  Suicide risk factors  Suicide prevention and interventions  National Suicide Hotline telephone number  [facility] assessment telephone number  [facility] Emergency Assistance 911  [facility] and/or Residential Mobile Crisis Unit telephone number  Request made of family/significant other to:  Remove weapons (e.g., guns, rifles, knives), all items previously/currently identified as safety concern.    Remove drugs/medications (over-the-counter, prescriptions, illicit drugs), all items previously/currently identified as a safety concern.  The family member/significant other verbalizes understanding of the suicide prevention education information provided.  The family member/significant other agrees to remove the items of safety concern listed above.  Glennon MacLaws, Gisell Buehrle P 04/06/2015, 2:43 PM

## 2015-04-06 NOTE — [facility] Suicide Risk Assessment (Signed)
[facility]   Demographic Factors:  Caucasian and Gay, lesbian, or bisexual orientation  Total Time spent with patient: 30 minutes  Musculoskeletal: Strength & Muscle Tone: within normal limits Gait & Station: normal Patient leans: N/A  Psychiatric Specialty Exam: Physical Exam  Nursing note and vitals reviewed.   Review of Systems  All other systems reviewed and are negative.   Blood pressure 157/98, pulse 76, temperature 97.7 F (36.5 C), temperature source Oral, resp. rate 20, height 5\' 11"  (1.803 m), weight 94.348 kg (208 lb), last menstrual period 03/14/2015, SpO2 97 %.Body mass index is 29.02 kg/(m^2).  General Appearance: Casual  Eye Contact::  Good  Speech:  Clear and Coherent409  Volume:  Normal  Mood:  Euthymic  Affect:  Appropriate  Thought Process:  Goal Directed and Logical  Orientation:  Full (Time, Place, and Person)  Thought Content:  WDL  Suicidal Thoughts:  No  Homicidal Thoughts:  No  Memory:  Immediate;   Fair Recent;   Fair Remote;   Fair  Judgement:  Fair  Insight:  Fair  Psychomotor Activity:  Normal  Concentration:  Fair  Recall:  [employer]Fair  Fund of Knowledge:Fair  Language: Fair  Akathisia:  No  Handed:  Right  AIMS (if indicated):     Assets:  Communication Skills Desire for Improvement Financial Resources/Insurance Housing Social Support  Sleep:  Number of Hours: 7.15  Cognition: WNL  ADL's:  Intact   Have you used any form of tobacco in the last 30 days? (Cigarettes, Smokeless Tobacco, Cigars, and/or Pipes): Yes  Has this patient used any form of tobacco in the last 30 days? (Cigarettes, Smokeless Tobacco, Cigars, and/or Pipes) Yes, A prescription for an FDA-approved tobacco cessation medication was offered at discharge and the patient refused  Mental Status Per Nursing Assessment::   On Admission:  Suicidal ideation indicated by others  Current Mental Status by Physician: NA  Loss Factors: Loss of  significant relationship  Historical Factors: Prior suicide attempts and Impulsivity  Risk Reduction Factors:   Sense of responsibility to family, Living with another person, especially a relative and Positive social support  Continued Clinical Symptoms:  Bipolar Disorder:   Depressive phase  Cognitive Features That Contribute To Risk:  None    Suicide Risk:  Minimal: No identifiable suicidal ideation.  Patients presenting with no risk factors but with morbid ruminations; may be classified as minimal risk based on the severity of the depressive symptoms  Principal Problem: Major depressive disorder, recurrent severe without psychotic features Discharge Diagnoses:  Patient Active Problem List   Diagnosis Date Noted  . Major depressive disorder, recurrent severe without psychotic features [F33.2] 03/30/2015    Priority: High  . Alcohol use disorder severe. [F10.10] 04/03/2015    Priority: Medium  . Cocaine use disorder severe. [F14.10] 04/03/2015    Priority: Medium  . Stimulant use disorder. [F15.10] 04/03/2015    Priority: Medium  . Tobacco use disorder [Z72.0] 04/01/2015    Priority: Medium  . Clotting disorder [D68.9] 09/26/2012    Priority: Medium  . Drug overdose [T50.901A]   . Intoxication [R69]   . Polysubstance abuse [F19.10]   . Major depressive disorder, single episode, severe without psychotic features [F32.2]   . Suicide attempt by multiple drug overdose [T50.902A] 03/05/2015  . Adjustment disorder with mixed anxiety and depressed mood [F43.23] 03/05/2015  . MDD (major depressive disorder), single episode, severe [F32.2] 03/05/2015  . Chronic pain [G89.29] 09/27/2012  . 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 Osteomyelitis, L tibia [M86.9] 09/26/2012  . Lupus [M32.9]   . Encounter for long-term (current) use of anticoagulants [Z79.01] 04/19/2012  . 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 Pulmonary embolism [I26.99]  04/14/2012      Plan Of Care/Follow-up recommendations:  Activity:  as tolerated Diet:  low sodium heart healthy Other:  keep follow up appointments.  Is patient on multiple antipsychotic therapies at discharge:  No   Has Patient had three or more failed trials of antipsychotic monotherapy by history:  No  Recommended Plan for Multiple Antipsychotic Therapies: NA    Jaiden Dinkins 04/06/2015, 12:15 PM

## 2015-04-06 NOTE — Progress Notes (Signed)
Recreation Therapy Notes  Date: 05.16.16 Time: 3:05 pm Location: Craft Room  Group Topic: Self-expression  Goal Area(s) Addresses:  Patient will effectively use art as a means of self-expression. Patient will recognize positive benefit of self-expression. Patient will be able to identify one emotion experienced during group session. Patient will identify use of art/self-expression as a coping skill.  Behavioral Response: Attentive, Interactive  Intervention: Two Faces of Me  Activity: Patients were given a blank face worksheet and instructed to draw a line down the middle. On one side, they were instructed to draw/write how they felt when they were admitted to the hospital. On the other side, they were instructed to draw/write how they want to feel when they are d/c.   Education: LRT educated patients on different forms of self-expression  Education Outcome: Acknowledges education/In group clarification offered  Clinical Observations/Feedback: Patient drew two different faces and wrote how they were different. Patient contributed to group discussion by stating how her faces were different, what she has learned in the hospital, what she plans to do to maintain the positive change, how she can use art as a coping skill, and what kind of feelings she experienced in group.   Jacquelynn CreeGreene,Arland Usery M, LRT/CTRS 04/06/2015 4:10 PM

## 2015-04-06 NOTE — Progress Notes (Signed)
ANTICOAGULATION CONSULT NOTE - Follow Up  Pharmacy Consult for Warfarin Indication: Hx PE, lupus  No Known Allergies  Patient Measurements: Height: 5\' 11"  (180.3 cm) Weight: 208 lb (94.348 kg) IBW/kg (Calculated) : 70.8  Vital Signs: BP: 157/98 mmHg (05/16 0656) Pulse Rate: 76 (05/16 0656)  Labs:  Recent Labs  04/04/15 1206 04/05/15 0645 04/06/15 0651  LABPROT 17.1* 16.9* 16.2*  INR 1.37 1.35 1.28    Estimated Creatinine Clearance: 102.4 mL/min (by C-G formula based on Cr of 0.86).   Medical History: Past Medical History  Diagnosis Date  . Pulmonary embolism 2008    seen at [facility]--right lung  . Lupus   . Pleurisy   . 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 Arthritis   . Osteomyelitis   . Depression   . Anxiety   . Polysubstance abuse   . Chronic pain     Medications:  Home dose warfarin: 15mg  MWF and 12.5mg  T/R/S/S reported per patient.  Last dose 5/7 at 2000.  Per last Anticoag visit on 5/4, warfarin dose was 12.5mg  MWF, 10mg  T/R/S/S.  Warfarin resumed 5/11, given 12.5mg  x 1 then 10mg  daily  Pt started carbamazepine 200mg  BID on 5/13   Assessment: 49 yoF on chronic anticoagulation for hx PE and lupus, also PMHx OM, arthritis, depression, and anxiety presents with OD on EtOH, lyrica, APAP, and klonopin.  INR supratherapeutic on admission.  Pharmacy consulted to resume warfarin dosing inpatient.  Carbamazepine DDI may reduce effectiveness of warfarin, may need further increases to achieve therapeutic INR.   Goal of Therapy:  INR 2-3 Monitor platelets by anticoagulation protocol: Yes   Plan:   Both Warfarin 10mg  and 12.5mg  doses were documented as given on 5/15.  5/16 INR = 1.28. Will not increase warfarin dose at this time.  Continue Warfarin 12.5mg  daily and recheck INR with daily labs.   Pharmacy to follow and adjust   Stormy CardKatsoudas,Shinika Estelle K, [facility]   04/06/2015 8:54 AM

## 2015-04-06 NOTE — Plan of Care (Signed)
Problem: [facility] Participation in Recreation Therapeutic Interventions Goal: STG-Patient will identify at least five coping skills for ** STG: Coping Skills - Within 4 treatment sessions, patient will verbalize at least 5 coping skills for substance abuse in each of 2 treatment sessions to decrease substance abuse post d/c.  Outcome: Completed/Met Date Met:  04/06/15 Treatment Session 2; Completed 2 out of 2: At approximately 11:25 am, LRT met with patient in craft room. Patient verbalized 5 coping skills for substance abuse. Patient was appreciative of the help.  Leonette Monarch, LRT/CTRS 05.16.16 12:24 pm Goal: STG-Other Recreation Therapy Goal (Specify) STG: Stress Management - Within 5 treatment sessions, patient will demonstrate at least one stress management technique in each of 2 treatment sessions to increase stress management skills post d/c.  Outcome: Progressing Treatment Session 2; Completed 1 out of 2: At approximately 11:25 am, LRT met with patient in craft room. Patient demonstrated one stress management technique. Patient reported she had been using the techniques often. LRT encouraged patient to continue to use the stress management techniques.  Leonette Monarch, LRT/CTRS 05.16.16 12:19 pm  Problem: [facility] Participation in Recreation Therapeutic Interventions Goal: STG-Patient will identify at least five coping skills for ** STG: Coping Skills - Within 4 treatment sessions, patient will verbalize at least 5 coping skills for anger in each of 2 treatment sessions to increase anger management skills post d/c.  Outcome: Completed/Met Date Met:  04/06/15 Treatment Session 2; Completed 2 out of 2: At approximately 11:25 am, LRT met with patient in craft room. Patient verbalized 5 coping skills for anger. Patient reported she was appreciative of the help.  Leonette Monarch, LRT/CTRS 05.16.16 12:20 pm

## 2015-04-06 NOTE — Discharge Summary (Signed)
Physician Discharge Summary Note  Patient:  Crystal Frank is an 49 y.o., female MRN:  [id number] DOB:  1966-08-01 Patient phone:  [phone] (home)  Patient address:   [address] [place] [address],  Total Time spent with patient: 30 minutes  Date of Admission:  04/01/2015 Date of Discharge: 04/06/2015  Reason for Admission:  Overdose.  Identifying data. Crystal Frank is a 49 year old female with a history of depression and substance use admitted for suicidal ideation in the context of major loss and substance abuse.  Chief complaint. "I don't know what to do."  History of present history of present illness. Crystal Frank denies any problems with depression or anxiety up until spring of 2016 at which time her relationship with her girlfriend of 7 years became strained. Escalating substance use contributed to the problems. The patient was admitted to [facility] in April 2016 and started on Cymbalta 40 mg. She did not take medication consistently or follow-up with a psychiatrist. On May 9 the patient was brought to [facility] emergency room again after a accidental overdose on drugs. She denies suicidal intention. She was arguing with her girlfriend they did drugs together. The neighbor called the ambulance and both of them were taken to the emergency room. Reportedly the girlfriend talk her way out of hospital admission. She later overdose on heroine lethally. The patient apparently was unaware of heroine problem. She was transferred to [facility] for further and further treatment and stabilization. She reports some symptoms of depression prior to admission with poor sleep, decreased appetite, anhedonia, poor energy and concentration, feeling of guilt and hopelessness worthlessness, social isolation. She blames substance use more than depression for her symptoms. She is adamant about not feeling suicidal ever. She denies symptoms of anxiety. She denies symptoms suggestive of  bipolar mania. There are no psychotic symptoms. The patient has been using alcohol and cocaine mostly. She denies symptoms of alcohol withdrawal.  Past psychiatric history. One prior hospitalization in April 2016. She has never been treated for depression before. No suicide attempts. She did overdose on drugs recreationally before. No history of substance abuse treatment.   Principal Problem: Major depressive disorder, recurrent severe without psychotic features Discharge Diagnoses: Patient Active Problem List   Diagnosis Date Noted  . Major depressive disorder, recurrent severe without psychotic features [F33.2] 03/30/2015    Priority: High  . Alcohol use disorder severe. [F10.10] 04/03/2015    Priority: Medium  . Cocaine use disorder severe. [F14.10] 04/03/2015    Priority: Medium  . Stimulant use disorder. [F15.10] 04/03/2015    Priority: Medium  . Tobacco use disorder [Z72.0] 04/01/2015    Priority: Medium  . Clotting disorder [D68.9] 09/26/2012    Priority: Medium  . Drug overdose [T50.901A]   . Intoxication [R69]   . Polysubstance abuse [F19.10]   . Major depressive disorder, single episode, severe without psychotic features [F32.2]   . Suicide attempt by multiple drug overdose [T50.902A] 03/05/2015  . Adjustment disorder with mixed anxiety and depressed mood [F43.23] 03/05/2015  . MDD (major depressive disorder), single episode, severe [F32.2] 03/05/2015  . Chronic pain [G89.29] 09/27/2012  . 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 Osteomyelitis, L tibia [M86.9] 09/26/2012  . Lupus [M32.9]   . Encounter for long-term (current) use of anticoagulants [Z79.01] 04/19/2012  . 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 Pulmonary embolism [I26.99] 04/14/2012    Musculoskeletal: Strength & Muscle Tone: within normal limits Gait & Station: normal  Patient leans: N/A  Psychiatric Specialty Exam: Physical Exam  Nursing note and vitals  reviewed.   Review of Systems  All other systems reviewed and are negative.   Blood pressure 157/98, pulse 76, temperature 97.7 F (36.5 C), temperature source Oral, resp. rate 20, height  (1.803 m), weight 94.348 kg (208 lb), last menstrual period 03/14/2015, SpO2 97 %.Body mass index is 29.02 kg/(m^2).  See SRA.                                                  Sleep:  Number of Hours: 7.15   Have you used any form of tobacco in the last 30 days? (Cigarettes, Smokeless Tobacco, Cigars, and/or Pipes): Yes  Has this patient used any form of tobacco in the last 30 days? (Cigarettes, Smokeless Tobacco, Cigars, and/or Pipes) Yes, A prescription for an FDA-approved tobacco cessation medication was offered at discharge and the patient refused  Past Medical History:  Past Medical History  Diagnosis Date  . Pulmonary embolism 2008    seen at [facility]--right lung  . Lupus   . Pleurisy   . 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 Arthritis   . Osteomyelitis   . Depression   . Anxiety   . Polysubstance abuse   . Chronic pain     Past Surgical History  Procedure Laterality Date  . Multiple orthopeadic surgeries      both ankles  . Ankle fusions      bilat fusions, multiple  . Hardware removal  09/25/2012    Procedure: HARDWARE REMOVAL;  Surgeon: Budd Palmer, MD;  Location[facility] OR;  Service: Orthopedics;  Laterality: Left;  HARDWARE REMOVAL LEFT ANKLE   . Tibia osteotomy  09/25/2012    Procedure: TIBIAL OSTEOTOMY;  Surgeon: Budd Palmer, MD;  Location: [facility] OR;  Service: Orthopedics;;  partial excision of tibia, placement of non-biodegradeable drug delivery device, left tibia  . Ankle arthrodesis  10/23/2012  . Ankle fusion  10/23/2012    Procedure: ARTHRODESIS ANKLE;  Surgeon: Budd Palmer, MD;  Location: [facility] OR;  Service: Orthopedics;  Laterality: Left;  REMOVAL RETAINED ANTIBIOTIC NAIL LEFT TIBIA/TIBIO TALAR FUSION    Family History:  Family History  Problem Relation Age of Onset  . Clotting disorder Paternal Aunt   . Cancer Maternal Uncle    Social History:  History  Alcohol Use  . 2.4 oz/week  . 4 Cans of beer per week    Comment: ocassionally     History  Drug Use  . Yes  . Special: Oxycodone, Cocaine, Marijuana    History   Social History  . Marital Status: Single    Spouse Name: N/A  . Number of Children: N/A  . Years of Education: N/A   Occupational History  . diabled    Social History Main Topics  . Smoking status: Current Every Day Smoker -- 0.50 packs/day for 27 years    Types: Cigarettes  . Smokeless tobacco: Current User  . Alcohol Use: 2.4 oz/week    4 Cans of beer per week     Comment: ocassionally  . Drug Use: Yes    Special: Oxycodone, Cocaine, Marijuana  . Sexual Activity: Yes   Other Topics Concern  . None   Social History Narrative    Past  Psychiatric History: Hospitalizations:  Outpatient Care:  Substance Abuse Care:  Self-Mutilation:  Suicidal Attempts:  Violent Behaviors:   Risk to Self: Is patient at risk for suicide?: Yes What has been your use of drugs/alcohol within the last 12 months?: Was occassionally using ( binging ) cocaine,marajuanna Risk to Others:   Prior Inpatient Therapy:   Prior Outpatient Therapy:    Level of Care:  OP  Hospital Course:   Crystal Frank is a 49 year old female with a history of depression and substance use who was admitted to the hospital after accidental overdose on drugs to discover that her girlfriend of 7 years died of a heroine overdose.  1. Suicidal ideation. This has resolved. The patient is able to contract for safety.   2. Depression. She was started on Cymbalta 40 mg in April 2016 but has not been compliant with treatment. She is on Cymbalta 60 mg for depression and Tegretol 200 mg twice daily for mood stabilization.  3. Insomnia. She was started on Elavil 50 mg with improvement.   4. History of  PE. On Coumadin since 2008,   5. Chronic pain. This was in the past address with Lyrica we will restart lithium Lyrica 100 mg twice daily. She also uses Advil to address pain from broken feet.  6. Lupus. No flareup at the moment.  7. Alcohol use. The patient has been drinking. She does not report any symptoms of alcohol withdrawal. Vital signs are stable.   8. Substance abuse treatment. The patient admits to using alcohol, cannabis and cocaine. She was positive for amphetamines as well on admission. She minimizes her problems and is not interested in residential substance abuse treatment program participation..  9. Social. She may stay with her sister and mother if not using drugs.   10. Smoking. Nicotine products products aren't available.  11. Disposition. TBE. [facility] if accepte or home with family. She will follow up with Dr. Evelene CroonKaur. Apparently she does not have medical provider at the moment. She wants to reestablish care with [facility] in [place].   Consults:  None  Significant Diagnostic Studies:  None  Discharge Vitals:   Blood pressure 157/98, pulse 76, temperature 97.7 F (36.5 C), temperature source Oral, resp. rate 20, height 5\' 11"  (1.803 m), weight 94.348 kg (208 lb), last menstrual period 03/14/2015, SpO2 97 %. Body mass index is 29.02 kg/(m^2). Lab Results:   Results for orders placed or performed during the hospital encounter of 04/01/15 (from the past 72 hour(s))  Protime-INR     Status: Abnormal   Collection Time: 04/04/15 12:06 PM  Result Value Ref Range   Prothrombin Time 17.1 (H) 11.4 - 15.0 seconds   INR 1.37   Protime-INR     Status: Abnormal   Collection Time: 04/05/15  6:45 AM  Result Value Ref Range   Prothrombin Time 16.9 (H) 11.4 - 15.0 seconds   INR 1.35   Protime-INR     Status: Abnormal   Collection Time: 04/06/15  6:51 AM  Result Value Ref Range   Prothrombin Time 16.2 (H) 11.4 - 15.0 seconds   INR 1.28     Physical Findings: AIMS: Facial and  Oral Movements Muscles of Facial Expression: None, normal Lips and Perioral Area: None, normal Jaw: None, normal Tongue: None, normal,Extremity Movements Upper (arms, wrists, hands, fingers): None, normal Lower (legs, knees, ankles, toes): None, normal, Trunk Movements Neck, shoulders, hips: None, normal, Overall Severity Severity of abnormal movements (highest score from questions above): None, normal Incapacitation due  to abnormal movements: None, normal Patient's awareness of abnormal movements (rate only patient's report): No Awareness, Dental Status Current problems with teeth and/or dentures?: No Does patient usually wear dentures?: No  CIWA:    COWS:      See Psychiatric Specialty Exam and Suicide Risk Assessment completed by Attending Physician prior to discharge.  Discharge destination:  Home  Is patient on multiple antipsychotic therapies at discharge:  No   Has Patient had three or more failed trials of antipsychotic monotherapy by history:  No    Recommended Plan for Multiple Antipsychotic Therapies: NA  Discharge Instructions    Diet - low sodium heart healthy    Complete by:  As directed      Increase activity slowly    Complete by:  As directed             Medication List    STOP taking these medications        acetaminophen 500 MG tablet  Commonly known as:  TYLENOL     traZODone 100 MG tablet  Commonly known as:  DESYREL      TAKE these medications      Indication   amitriptyline 50 MG tablet  Commonly known as:  ELAVIL  Take 1 tablet (50 mg total) by mouth at bedtime.   Indication:  Depression, Trouble Sleeping     carbamazepine 200 MG tablet  Commonly known as:  TEGRETOL  Take 1 tablet (200 mg total) by mouth 2 (two) times daily.   Indication:  Manic-Depression     DULoxetine 60 MG capsule  Commonly known as:  CYMBALTA  Take 1 capsule (60 mg total) by mouth daily.   Indication:  Diabetes with Nerve Disease, Major Depressive Disorder      pregabalin 100 MG capsule  Commonly known as:  LYRICA  Take 1 capsule (100 mg total) by mouth 2 (two) times daily.   Indication:  Neuropathic Pain     warfarin 5 MG tablet  Commonly known as:  COUMADIN  Take 15 mg on 03/11/15 and 03/12/15 then Take as directed by coumadin clinic   Indication:  Blood Vessel Obstruction by a Blood Clot           Follow-up Information    Follow up with Dr. Hurley CiscoBarbara Fousek. Go on 04/16/2015.   Why:  Hospital Follow up, Therapy, Outpatient Medication Management, Please take your photo ID and Insurance card  *After meeting with therapist you will be added to Dr. Carie CaddyKaur's schedule*   Contact information:   [address] [place], [place] [address] [phone]; [phone]      Follow-up recommendations:  Activity:  as tolerated Diet:  low sodium heart healthy Other:  keep follow up appointments.  Comments:    Total Discharge Time: 4040  Signed: Kristine LineaUCILOWSKA, Idella Lamontagne 04/06/2015, 7:05 PM

## 2015-04-09 ENCOUNTER — Ambulatory Visit (INDEPENDENT_AMBULATORY_CARE_PROVIDER_SITE_OTHER): Payer: Medicare Other

## 2015-04-09 DIAGNOSIS — I2699 Other pulmonary embolism without acute cor pulmonale: Secondary | ICD-10-CM

## 2015-04-09 LAB — POCT INR: INR: 1.1

## 2015-04-16 ENCOUNTER — Ambulatory Visit (INDEPENDENT_AMBULATORY_CARE_PROVIDER_SITE_OTHER): Payer: Medicare Other | Admitting: *Deleted

## 2015-04-16 DIAGNOSIS — I2699 Other pulmonary embolism without acute cor pulmonale: Secondary | ICD-10-CM

## 2015-04-16 DIAGNOSIS — Z630 Problems in relationship with spouse or partner: Secondary | ICD-10-CM | POA: Diagnosis not present

## 2015-04-16 DIAGNOSIS — F4323 Adjustment disorder with mixed anxiety and depressed mood: Secondary | ICD-10-CM | POA: Diagnosis not present

## 2015-04-16 LAB — POCT INR: INR: 1.5

## 2015-05-07 ENCOUNTER — Other Ambulatory Visit: Payer: Self-pay | Admitting: Psychiatry

## 2015-05-08 ENCOUNTER — Telehealth: Payer: Self-pay | Admitting: Pulmonary Disease

## 2015-05-08 NOTE — Telephone Encounter (Signed)
Patient calling to see if Crystal Frank can help her fill a few prescriptions was filled by Crystal Frank with [facility].  She says that they are not allowed to write refills after patient has left.  Patient says that the only doctor she is seeing now is Crystal Frank and she cannot see Crystal Frank until August 12.  She wants to know if Crystal Frank can refill her : Cymbalta 60mg  qd, Lyrica 100mg  bid, Amitriptlyine 50mg  qd, Tegretol 200mg  bid.  (Pharmacy : [facility] [place]) until she can get in to see Crystal Frank.     RA - please advise.

## 2015-05-09 NOTE — Telephone Encounter (Signed)
No - but will forward her request to dr Malon Kindle office to se if anyone (NP) can see her before & take care of this

## 2015-05-11 NOTE — Telephone Encounter (Signed)
Pt is aware. Nothing further needed 

## 2015-05-12 ENCOUNTER — Ambulatory Visit (INDEPENDENT_AMBULATORY_CARE_PROVIDER_SITE_OTHER): Payer: Medicare Other | Admitting: *Deleted

## 2015-05-12 DIAGNOSIS — I2699 Other pulmonary embolism without acute cor pulmonale: Secondary | ICD-10-CM

## 2015-05-12 LAB — POCT INR: INR: 1.2

## 2015-05-26 ENCOUNTER — Ambulatory Visit (INDEPENDENT_AMBULATORY_CARE_PROVIDER_SITE_OTHER): Payer: Medicare Other | Admitting: *Deleted

## 2015-05-26 DIAGNOSIS — I2699 Other pulmonary embolism without acute cor pulmonale: Secondary | ICD-10-CM | POA: Diagnosis not present

## 2015-05-26 LAB — POCT INR: INR: 2.7

## 2015-06-02 ENCOUNTER — Ambulatory Visit (INDEPENDENT_AMBULATORY_CARE_PROVIDER_SITE_OTHER): Payer: Medicare Other | Admitting: *Deleted

## 2015-06-02 DIAGNOSIS — I2699 Other pulmonary embolism without acute cor pulmonale: Secondary | ICD-10-CM | POA: Diagnosis not present

## 2015-06-02 LAB — POCT INR: INR: 4.6

## 2015-06-15 ENCOUNTER — Ambulatory Visit (INDEPENDENT_AMBULATORY_CARE_PROVIDER_SITE_OTHER): Payer: Medicare Other | Admitting: Pharmacist

## 2015-06-15 DIAGNOSIS — I2699 Other pulmonary embolism without acute cor pulmonale: Secondary | ICD-10-CM

## 2015-06-15 LAB — POCT INR: INR: 4.4

## 2015-06-22 ENCOUNTER — Ambulatory Visit (INDEPENDENT_AMBULATORY_CARE_PROVIDER_SITE_OTHER): Payer: Medicare Other | Admitting: Pharmacist

## 2015-06-22 DIAGNOSIS — I2699 Other pulmonary embolism without acute cor pulmonale: Secondary | ICD-10-CM

## 2015-06-22 LAB — POCT INR: INR: 1.8

## 2015-07-03 ENCOUNTER — Ambulatory Visit: Payer: Medicare Other | Admitting: Internal Medicine

## 2015-07-08 ENCOUNTER — Ambulatory Visit (INDEPENDENT_AMBULATORY_CARE_PROVIDER_SITE_OTHER): Payer: Medicare Other | Admitting: *Deleted

## 2015-07-08 DIAGNOSIS — I2699 Other pulmonary embolism without acute cor pulmonale: Secondary | ICD-10-CM

## 2015-07-08 LAB — POCT INR: INR: 4.9

## 2015-07-15 ENCOUNTER — Encounter: Payer: Self-pay | Admitting: Family

## 2015-07-15 ENCOUNTER — Ambulatory Visit (INDEPENDENT_AMBULATORY_CARE_PROVIDER_SITE_OTHER): Payer: Medicare Other | Admitting: Family

## 2015-07-15 VITALS — BP 142/104 | HR 100 | Temp 97.8°F | Resp 18 | Ht 71.0 in | Wt 233.0 lb

## 2015-07-15 DIAGNOSIS — I2699 Other pulmonary embolism without acute cor pulmonale: Secondary | ICD-10-CM | POA: Diagnosis not present

## 2015-07-15 DIAGNOSIS — Z79899 Other long term (current) drug therapy: Secondary | ICD-10-CM | POA: Diagnosis not present

## 2015-07-15 DIAGNOSIS — G8929 Other chronic pain: Secondary | ICD-10-CM

## 2015-07-15 DIAGNOSIS — Z79891 Long term (current) use of opiate analgesic: Secondary | ICD-10-CM | POA: Diagnosis not present

## 2015-07-15 MED ORDER — AMITRIPTYLINE HCL 50 MG PO TABS: 50.0000 mg | ORAL_TABLET | Freq: Every day | ORAL | Status: DC

## 2015-07-15 MED ORDER — DULOXETINE HCL 60 MG PO CPEP: 60.0000 mg | ORAL_CAPSULE | Freq: Every day | ORAL | Status: DC

## 2015-07-15 MED ORDER — PREGABALIN 100 MG PO CAPS: 100.0000 mg | ORAL_CAPSULE | Freq: Two times a day (BID) | ORAL | Status: DC

## 2015-07-15 MED ORDER — OXYCODONE HCL 10 MG PO TABS: 10.0000 mg | ORAL_TABLET | Freq: Three times a day (TID) | ORAL | Status: DC | PRN

## 2015-07-15 NOTE — Patient Instructions (Signed)
Thank you for choosing [facility].  Summary/Instructions:  Your prescription(s) have been submitted to your pharmacy or been printed and provided for you. Please take as directed and contact our office if you believe you are having problem(s) with the medication(s) or have any questions.  Referrals have been made during this visit. You should expect to hear back from our schedulers in about 7-10 days in regards to establishing an appointment with the specialists we discussed.   If your symptoms worsen or fail to improve, please contact our office for further instruction, or in case of emergency go directly to the emergency room at the closest medical facility.     
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
## 2015-07-15 NOTE — Progress Notes (Signed)
Pre visit review using our clinic review tool, if applicable. No additional management support is needed unless otherwise documented below in the visit note. 

## 2015-07-15 NOTE — Assessment & Plan Note (Signed)
Stable and maintained on coumadin with no adverse bleeding or side effects. Hematology has recommended lifelong anticoagulant therapy. Continue current dosage of coumadin and changes per Coumadin clinic.

## 2015-07-16 ENCOUNTER — Ambulatory Visit (INDEPENDENT_AMBULATORY_CARE_PROVIDER_SITE_OTHER): Payer: Medicare Other | Admitting: *Deleted

## 2015-07-16 DIAGNOSIS — I2699 Other pulmonary embolism without acute cor pulmonale: Secondary | ICD-10-CM

## 2015-07-16 LAB — POCT INR: INR: 2

## 2015-08-13 ENCOUNTER — Ambulatory Visit (INDEPENDENT_AMBULATORY_CARE_PROVIDER_SITE_OTHER): Payer: Medicare Other | Admitting: Family

## 2015-08-13 ENCOUNTER — Encounter: Payer: Self-pay | Admitting: Family

## 2015-08-13 VITALS — BP 134/92 | HR 104 | Temp 98.4°F | Resp 18 | Ht 71.0 in | Wt 243.0 lb

## 2015-08-13 DIAGNOSIS — G4700 Insomnia, unspecified: Secondary | ICD-10-CM | POA: Diagnosis not present

## 2015-08-13 DIAGNOSIS — G8929 Other chronic pain: Secondary | ICD-10-CM | POA: Diagnosis not present

## 2015-08-13 DIAGNOSIS — F332 Major depressive disorder, recurrent severe without psychotic features: Secondary | ICD-10-CM

## 2015-08-13 MED ORDER — OXYCODONE HCL 10 MG PO TABS: 10.0000 mg | ORAL_TABLET | Freq: Three times a day (TID) | ORAL | Status: DC | PRN

## 2015-08-13 MED ORDER — DULOXETINE HCL 60 MG PO CPEP: 60.0000 mg | ORAL_CAPSULE | Freq: Every day | ORAL | Status: DC

## 2015-08-13 MED ORDER — PREGABALIN 100 MG PO CAPS: 100.0000 mg | ORAL_CAPSULE | Freq: Two times a day (BID) | ORAL | Status: DC

## 2015-08-13 MED ORDER — AMITRIPTYLINE HCL 50 MG PO TABS: 50.0000 mg | ORAL_TABLET | Freq: Every day | ORAL | Status: DC

## 2015-08-13 NOTE — Assessment & Plan Note (Signed)
Stable with current dose of Cymbalta. No adverse side effects and reports stable mood with no suicidal ideation. Continue current dose of Cymbalta.

## 2015-08-13 NOTE — Assessment & Plan Note (Signed)
Currently stable with amitryptyline and reports 7 hours of sleep nightly with no adverse effects. Continue current dosage of amitryptyline.

## 2015-08-13 NOTE — Progress Notes (Signed)
Pre visit review using our clinic review tool, if applicable. No additional management support is needed unless otherwise documented below in the visit note. 

## 2015-08-13 NOTE — Assessment & Plan Note (Signed)
Chronic ankle pain is currently controlled with oxycodone and Lyrica. Denies adverse side effects or constipation and notes functionally improved with medications. Awaiting pain clinic admission. [place] Controlled Substance Database reviewed with no irregularities. Follow up in 1 month or sooner if needed.

## 2015-08-13 NOTE — Patient Instructions (Signed)
Thank you for choosing [facility].  Summary/Instructions:  Your prescription(s) have been submitted to your pharmacy or been printed and provided for you. Please take as directed and contact our office if you believe you are having problem(s) with the medication(s) or have any questions.  If your symptoms worsen or fail to improve, please contact our office for further instruction, or in case of emergency go directly to the emergency room at the closest medical facility.     
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
## 2015-08-18 ENCOUNTER — Other Ambulatory Visit: Payer: Self-pay | Admitting: Psychiatry

## 2015-09-01 ENCOUNTER — Telehealth: Payer: Self-pay | Admitting: Family

## 2015-09-01 DIAGNOSIS — G8929 Other chronic pain: Secondary | ICD-10-CM

## 2015-09-01 MED ORDER — AMITRIPTYLINE HCL 50 MG PO TABS
50.0000 mg | ORAL_TABLET | Freq: Every day | ORAL | Status: DC
Start: 2015-09-01 — End: 2015-11-12

## 2015-09-01 NOTE — Telephone Encounter (Signed)
Pt states pharmacy still has not received the prescription for amitriptyline (ELAVIL) 50 MG tablet [[id number]  Can you please resend it to [facility] on [address] in [place]

## 2015-09-01 NOTE — Telephone Encounter (Signed)
First rx was on no print. Sent electronically...[name]

## 2015-09-04 ENCOUNTER — Other Ambulatory Visit: Payer: Self-pay | Admitting: Pulmonary Disease

## 2015-09-04 NOTE — Telephone Encounter (Signed)
Received refill request for coumadin. Last seen by RA 05/05/14 and has no pending appt Please advise on refill.

## 2015-09-05 NOTE — Telephone Encounter (Signed)
Please ask her to get refills from PCP If from us, she will need to see us once every 6 months

## 2015-09-09 ENCOUNTER — Telehealth: Payer: Self-pay | Admitting: Pulmonary Disease

## 2015-09-09 ENCOUNTER — Telehealth: Payer: Self-pay | Admitting: *Deleted

## 2015-09-09 DIAGNOSIS — G8929 Other chronic pain: Secondary | ICD-10-CM

## 2015-09-09 NOTE — Telephone Encounter (Signed)
Has not been seen since 04/2014. Attempted to call pt x1. No answer. Will try back.

## 2015-09-09 NOTE — Telephone Encounter (Signed)
Receive call pt is requesting refills on her Oxycodone & Lyrica...[name]/lmb

## 2015-09-10 MED ORDER — PREGABALIN 100 MG PO CAPS: 100.0000 mg | ORAL_CAPSULE | Freq: Two times a day (BID) | ORAL | Status: DC

## 2015-09-10 MED ORDER — OXYCODONE HCL 10 MG PO TABS: 10.0000 mg | ORAL_TABLET | Freq: Three times a day (TID) | ORAL | Status: DC | PRN

## 2015-09-10 NOTE — Telephone Encounter (Signed)
Pls advise on msg below.../lmb 

## 2015-09-10 NOTE — Telephone Encounter (Signed)
Medications refilled and printed with appropriate dates for refill.

## 2015-09-10 NOTE — Addendum Note (Signed)
Addended by: Jeanine LuzALONE, Guiseppe Flanagan D on: 09/10/2015 09:48 AM   Modules accepted: Orders

## 2015-09-10 NOTE — Telephone Encounter (Signed)
Tried calling pt # given not a working #. Called cell # no answer can't leave msg due to vm being full. Rx has been place up front for pick-up...[name]/lmb

## 2015-09-10 NOTE — Telephone Encounter (Signed)
Called pt, no answer and no vm.  Wcb.  

## 2015-09-11 NOTE — Telephone Encounter (Signed)
lmtcb

## 2015-09-14 ENCOUNTER — Ambulatory Visit (INDEPENDENT_AMBULATORY_CARE_PROVIDER_SITE_OTHER): Payer: Medicare Other | Admitting: Pulmonary Disease

## 2015-09-14 ENCOUNTER — Encounter: Payer: Self-pay | Admitting: Pulmonary Disease

## 2015-09-14 VITALS — BP 144/86 | HR 106 | Ht 71.0 in | Wt 237.0 lb

## 2015-09-14 DIAGNOSIS — I2699 Other pulmonary embolism without acute cor pulmonale: Secondary | ICD-10-CM | POA: Diagnosis not present

## 2015-09-14 MED ORDER — WARFARIN SODIUM 5 MG PO TABS: ORAL_TABLET | ORAL | Status: DC
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
## 2015-09-14 NOTE — Patient Instructions (Signed)
Refills on coumadin Alternatives include XARELTO & ELIQUIS - pl find out from your pharmacist if these are covered OK to be filled by PCP in the future

## 2015-09-14 NOTE — Assessment & Plan Note (Addendum)
Refills on coumadin Alternatives include XARELTO & ELIQUIS , I'm not sure about the data for lupus anticoagulant- pl find out from your pharmacist if these are covered OK to be filled by PCP in the future Lifelong and decortication has been advised

## 2015-09-17 ENCOUNTER — Other Ambulatory Visit: Payer: Self-pay | Admitting: Pulmonary Disease

## 2015-09-18 ENCOUNTER — Other Ambulatory Visit: Payer: Self-pay | Admitting: *Deleted

## 2015-10-08 ENCOUNTER — Telehealth: Payer: Self-pay | Admitting: *Deleted

## 2015-10-08 NOTE — Telephone Encounter (Signed)
Notified pt with Crystal Frank response. Made appt for 11/22 @ 2:30...[name]/lmb

## 2015-10-08 NOTE — Telephone Encounter (Signed)
Left msg on triage stating she will need refill on her oxycodone & Lyrica...[name]/lmb

## 2015-10-08 NOTE — Telephone Encounter (Signed)
Needs office visit and UDS prior to refill.

## 2015-10-13 ENCOUNTER — Ambulatory Visit (INDEPENDENT_AMBULATORY_CARE_PROVIDER_SITE_OTHER): Payer: Medicare Other | Admitting: General Practice

## 2015-10-13 ENCOUNTER — Encounter: Payer: Self-pay | Admitting: Family

## 2015-10-13 ENCOUNTER — Ambulatory Visit (INDEPENDENT_AMBULATORY_CARE_PROVIDER_SITE_OTHER): Payer: Medicare Other | Admitting: Family

## 2015-10-13 VITALS — BP 128/80 | HR 124 | Temp 97.6°F | Ht 71.0 in | Wt 238.0 lb

## 2015-10-13 DIAGNOSIS — Z5181 Encounter for therapeutic drug level monitoring: Secondary | ICD-10-CM

## 2015-10-13 DIAGNOSIS — I2699 Other pulmonary embolism without acute cor pulmonale: Secondary | ICD-10-CM | POA: Diagnosis not present

## 2015-10-13 DIAGNOSIS — IMO0002 Reserved for concepts with insufficient information to code with codable children: Secondary | ICD-10-CM

## 2015-10-13 DIAGNOSIS — D689 Coagulation defect, unspecified: Secondary | ICD-10-CM | POA: Diagnosis not present

## 2015-10-13 DIAGNOSIS — M329 Systemic lupus erythematosus, unspecified: Secondary | ICD-10-CM | POA: Diagnosis not present

## 2015-10-13 DIAGNOSIS — G8929 Other chronic pain: Secondary | ICD-10-CM

## 2015-10-13 LAB — POCT INR: INR: 1.6

## 2015-10-13 MED ORDER — WARFARIN SODIUM 5 MG PO TABS: ORAL_TABLET | ORAL | Status: DC

## 2015-10-13 MED ORDER — OXYCODONE HCL 10 MG PO TABS: 10.0000 mg | ORAL_TABLET | Freq: Three times a day (TID) | ORAL | Status: DC | PRN

## 2015-10-13 NOTE — Patient Instructions (Signed)
Thank you for choosing [facility].  Summary/Instructions:  Please continue to take your medications as prescribed.   Follow up in the coumadin clinic.   Your prescription(s) have been submitted to your pharmacy or been printed and provided for you. Please take as directed and contact our office if you believe you are having problem(s) with the medication(s) or have any questions.  If your symptoms worsen or fail to improve, please contact our office for further instruction, or in case of emergency go directly to the emergency room at the closest medical facility.

## 2015-10-13 NOTE — Progress Notes (Signed)
I have reviewed and agree with the plan. 
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
## 2015-10-13 NOTE — Progress Notes (Signed)
Pre visit review using our clinic review tool, if applicable. No additional management support is needed unless otherwise documented below in the visit note. 

## 2015-10-13 NOTE — Assessment & Plan Note (Signed)
Continues to experience symptoms are chronic pain in bilateral ankles from previous injury. Pain with adequate improvements with current regimen and no adverse side effects. Continue current dosage of oxycodone. Refer to pain clinic for further management. [place] controlled substance database reviewed with no irregularities and urine drug screen performed.

## 2015-10-13 NOTE — Assessment & Plan Note (Signed)
Previously diagnosed with pulmonary embolism and lifelong anticoagulation advised by hematology. Stable with current dosage of warfarin and due for INR. Refer to Coumadin clinic for continued management. Refill Coumadin current dosage. Follow-up with Coumadin clinic and primary care as needed.

## 2015-10-14 ENCOUNTER — Telehealth: Payer: Self-pay | Admitting: Family

## 2015-10-14 DIAGNOSIS — G8929 Other chronic pain: Secondary | ICD-10-CM

## 2015-10-14 DIAGNOSIS — Z79891 Long term (current) use of opiate analgesic: Secondary | ICD-10-CM | POA: Diagnosis not present

## 2015-10-14 MED ORDER — PREGABALIN 100 MG PO CAPS: 100.0000 mg | ORAL_CAPSULE | Freq: Two times a day (BID) | ORAL | Status: DC

## 2015-10-14 NOTE — Telephone Encounter (Signed)
Pt requesting refill for pregabalin (LYRICA) 100 MG capsule [[id number] Pharmacy [facility] [address] [facility] [place]

## 2015-10-14 NOTE — Telephone Encounter (Signed)
Medication faxed to pharmacy 

## 2015-10-28 ENCOUNTER — Ambulatory Visit (INDEPENDENT_AMBULATORY_CARE_PROVIDER_SITE_OTHER): Payer: Medicare Other | Admitting: General Practice

## 2015-10-28 DIAGNOSIS — I2699 Other pulmonary embolism without acute cor pulmonale: Secondary | ICD-10-CM | POA: Diagnosis not present

## 2015-10-28 DIAGNOSIS — M329 Systemic lupus erythematosus, unspecified: Secondary | ICD-10-CM

## 2015-10-28 DIAGNOSIS — Z5181 Encounter for therapeutic drug level monitoring: Secondary | ICD-10-CM | POA: Diagnosis not present

## 2015-10-28 DIAGNOSIS — IMO0002 Reserved for concepts with insufficient information to code with codable children: Secondary | ICD-10-CM

## 2015-10-28 LAB — POCT INR: INR: 3

## 2015-10-28 NOTE — Progress Notes (Signed)
Pre visit review using our clinic review tool, if applicable. No additional management support is needed unless otherwise documented below in the visit note. 

## 2015-10-28 NOTE — Progress Notes (Signed)
I have reviewed and agree with the plan. 

## 2015-11-12 ENCOUNTER — Encounter: Payer: Self-pay | Admitting: Family

## 2015-11-12 ENCOUNTER — Ambulatory Visit (INDEPENDENT_AMBULATORY_CARE_PROVIDER_SITE_OTHER): Payer: Medicare Other | Admitting: Family

## 2015-11-12 VITALS — BP 132/92 | HR 111 | Temp 98.0°F | Resp 18 | Ht 71.0 in | Wt 235.0 lb

## 2015-11-12 DIAGNOSIS — G8929 Other chronic pain: Secondary | ICD-10-CM

## 2015-11-12 DIAGNOSIS — F322 Major depressive disorder, single episode, severe without psychotic features: Secondary | ICD-10-CM

## 2015-11-12 DIAGNOSIS — G4700 Insomnia, unspecified: Secondary | ICD-10-CM | POA: Diagnosis not present

## 2015-11-12 MED ORDER — DULOXETINE HCL 60 MG PO CPEP: 60.0000 mg | ORAL_CAPSULE | Freq: Every day | ORAL | Status: DC

## 2015-11-12 MED ORDER — OXYCODONE HCL 10 MG PO TABS: 10.0000 mg | ORAL_TABLET | Freq: Three times a day (TID) | ORAL | Status: DC | PRN

## 2015-11-12 MED ORDER — PREGABALIN 100 MG PO CAPS: 100.0000 mg | ORAL_CAPSULE | Freq: Two times a day (BID) | ORAL | Status: DC

## 2015-11-12 MED ORDER — TRAZODONE HCL 50 MG PO TABS: 25.0000 mg | ORAL_TABLET | Freq: Every evening | ORAL | Status: DC | PRN

## 2015-11-12 NOTE — Assessment & Plan Note (Signed)
Chronic pain adequately controlled with current dosage of oxycodone. Denies constipation. [place] controlled substance database reviewed with no irregularities. Urine drug screen at next prescription refill if needed. Discussed that long-term pain management will not be provided through this provider as she is established with a pain clinic and needs to start pain management through that office.

## 2015-11-12 NOTE — Patient Instructions (Signed)
Thank you for choosing [facility].  Summary/Instructions:  Your prescription(s) have been submitted to your pharmacy or been printed and provided for you. Please take as directed and contact our office if you believe you are having problem(s) with the medication(s) or have any questions.  If your symptoms worsen or fail to improve, please contact our office for further instruction, or in case of emergency go directly to the emergency room at the closest medical facility.   Please continue to take your medication as prescribed.    

## 2015-11-12 NOTE — Progress Notes (Signed)
Subjective:    Patient ID: Crystal ButtsJennifer L Frank, female    DOB: 14-Jun-1966, [age] y.o.   MRN: [id number]  Chief Complaint  Patient presents with  . med follow up    switch sleep meds to lunesta, heag did call and could not afford the first visit    HPI:  Crystal Frank is a 49 y.o. 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 and presents today for an office follow up.   1.) Chronic pain - recently established appointment with pain clinic and upon arrival to the pain clinic she did not have enough money to pay for the initial visit. She is attending to reschedule her initial visit with the pain clinic and would like to refill her pain medications as she is saving up money for the appointment. She is currently managed on oxycodone 10 mg 3 times daily. Takes medication as prescribed and denies adverse side effects or constipation. Reports her pain is adequately controlled with this medication.  2.) Sleep - Currently started on sleeping agent that contains serotonion (5HT3) to help with sleep. There is concern for mixture with the amitriptyline and would like to switch off the medication. Reports that she has had a lot of loses this time of year. States she has taken medications prescribed to other people including seroquel which causes her anxiety.  No Known Allergies   Current Outpatient Prescriptions on File Prior to Visit  Medication Sig Dispense Refill  . warfarin (COUMADIN) 5 MG tablet TAKE AS DIRECTED BY  COUMADIN  CLINIC 90 tablet 0   No current facility-administered medications on file prior to visit.    Review of Systems  Psychiatric/Behavioral: Positive for sleep disturbance and dysphoric mood. Negative for suicidal ideas.      Objective:    BP  132/92 mmHg  Pulse 111  Temp(Src) 98 F (36.7 C) (Oral)  Resp 18  Ht 5\' 11"  (1.803 m)  Wt 235 lb (106.595 kg)  BMI 32.79 kg/m2  SpO2 99% Nursing note and vital signs reviewed.  Physical Exam  Constitutional: She is oriented to person, place, and time. She appears well-developed and well-nourished. No distress.  Cardiovascular: Normal rate, regular rhythm, normal heart sounds and intact distal pulses.   Pulmonary/Chest: Effort normal and breath sounds normal.  Neurological: She is alert and oriented to person, place, and time.  Skin: Skin is warm and dry.  Psychiatric: She has a normal mood and affect. Her behavior is normal. Judgment and thought content normal.       Assessment & Plan:   Problem List Items Addressed This Visit      Other   Chronic pain - Primary (Chronic)    Chronic pain adequately controlled with current dosage of oxycodone. Denies constipation. [place] controlled substance database reviewed with no irregularities. Urine drug screen at next prescription refill if needed. Discussed that long-term pain management will not be provided through this provider as she is established with a pain clinic and needs to start pain management through that office.      Relevant Medications   traZODone (DESYREL) 50 MG tablet   DULoxetine (CYMBALTA) 60 MG capsule   Oxycodone HCl 10 MG TABS   pregabalin (LYRICA) 100 MG capsule   MDD (major depressive disorder), single episode, severe (HCC)    Depression adequately controlled  with current dosage of Cymbalta. Denies adverse side effects or suicidal ideations. Describes how the holiday time is difficult secondary to family stressors and previous losses. Continue current dosage of Cymbalta.      Relevant Medications   traZODone (DESYREL) 50 MG tablet   DULoxetine (CYMBALTA) 60 MG capsule   Insomnia    Discontinue amitriptyline and start trazodone. Recently started over-the-counter supplements to assist with sleep and  concern for increased serotonin and possible serotonin syndrome. Follow-up pending trial of trazodone.

## 2015-11-12 NOTE — Assessment & Plan Note (Signed)
Discontinue amitriptyline and start trazodone. Recently started over-the-counter supplements to assist with sleep and concern for increased serotonin and possible serotonin syndrome. Follow-up pending trial of trazodone.

## 2015-11-12 NOTE — Progress Notes (Signed)
Pre visit review using our clinic review tool, if applicable. No additional management support is needed unless otherwise documented below in the visit note. 

## 2015-11-12 NOTE — Assessment & Plan Note (Signed)
Depression adequately controlled with current dosage of Cymbalta. Denies adverse side effects or suicidal ideations. Describes how the holiday time is difficult secondary to family stressors and previous losses. Continue current dosage of Cymbalta.

## 2015-11-18 ENCOUNTER — Other Ambulatory Visit: Payer: Self-pay | Admitting: Family

## 2015-11-18 ENCOUNTER — Encounter: Payer: Self-pay | Admitting: Family

## 2015-11-25 ENCOUNTER — Ambulatory Visit (INDEPENDENT_AMBULATORY_CARE_PROVIDER_SITE_OTHER): Payer: Medicare Other | Admitting: General Practice

## 2015-11-25 DIAGNOSIS — Z5181 Encounter for therapeutic drug level monitoring: Secondary | ICD-10-CM | POA: Diagnosis not present

## 2015-11-25 DIAGNOSIS — I2699 Other pulmonary embolism without acute cor pulmonale: Secondary | ICD-10-CM

## 2015-11-25 DIAGNOSIS — M329 Systemic lupus erythematosus, unspecified: Secondary | ICD-10-CM | POA: Diagnosis not present

## 2015-11-25 LAB — POCT INR: INR: 2.5

## 2015-11-25 NOTE — Progress Notes (Signed)
I have reviewed and agree with the plan. 

## 2015-11-25 NOTE — Progress Notes (Signed)
Pre visit review using our clinic review tool, if applicable. No additional management support is needed unless otherwise documented below in the visit note. 

## 2015-12-09 ENCOUNTER — Ambulatory Visit (INDEPENDENT_AMBULATORY_CARE_PROVIDER_SITE_OTHER): Payer: Medicare Other | Admitting: Family

## 2015-12-09 ENCOUNTER — Encounter: Payer: Self-pay | Admitting: Family

## 2015-12-09 VITALS — BP 142/84 | HR 102 | Temp 97.5°F | Resp 18 | Ht 71.0 in | Wt 238.0 lb

## 2015-12-09 DIAGNOSIS — G8929 Other chronic pain: Secondary | ICD-10-CM

## 2015-12-09 MED ORDER — PREGABALIN 100 MG PO CAPS: 100.0000 mg | ORAL_CAPSULE | Freq: Two times a day (BID) | ORAL | Status: DC

## 2015-12-09 MED ORDER — OXYCODONE HCL 10 MG PO TABS: 10.0000 mg | ORAL_TABLET | Freq: Three times a day (TID) | ORAL | Status: DC | PRN

## 2015-12-09 NOTE — Patient Instructions (Signed)
Thank you for choosing [facility].  Summary/Instructions:  Your prescription(s) have been submitted to your pharmacy or been printed and provided for you. Please take as directed and contact our office if you believe you are having problem(s) with the medication(s) or have any questions.  If your symptoms worsen or fail to improve, please contact our office for further instruction, or in case of emergency go directly to the emergency room at the closest medical facility.   You have been referred to pain medicine.  Please continue to take your medications as prescribed.

## 2015-12-09 NOTE — Progress Notes (Signed)
   Subjective:    Patient ID: Crystal Frank, female    DOB: [birth year], 50 y.o.   MRN: [id number]  Chief Complaint  Patient presents with  . Medication follow up    Refill for lyrica and oxycodone    HPI:  Crystal Frank is a 50 y.o. 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 and presents today for a follow up office visit.   1.) Chronic pain - Continues to experience chronic pain located in her bilateral ankles that is currently maintained on the oxycodone and lyrica. Takes the medication as prescribed and reports that the pain is managed adequately, however is not able to get up like she wants to. She does continue to experience increased pain when standing for periods of time for as little as 5 minutes on occasion. Denies constipation. Does not currently use ice/heat or any other non-pharmacological pain.   No Known Allergies   Current Outpatient Prescriptions on File Prior to Visit  Medication Sig Dispense Refill  . DULoxetine (CYMBALTA) 60 MG capsule Take 1 capsule (60 mg total) by mouth daily. 90 capsule 0  . traZODone (DESYREL) 50 MG tablet Take 0.5-1 tablets (25-50 mg total) by mouth at bedtime as needed for sleep. 60 tablet 0  . warfarin (COUMADIN) 5 MG tablet TAKE AS DIRECTED BY COUMADIN CLINIC 90 tablet 0   No current facility-administered medications on file prior to visit.    Review of Systems  Musculoskeletal: Positive for back pain.       Positive for bilateral ankle pain and left shoulder pain.       Objective:    BP 142/84 mmHg  Pulse 102  Temp(Src) 97.5 F (36.4 C) (Oral)  Resp 18  Ht  (1.803 m)  Wt 238 lb (107.956 kg)  BMI 33.21 kg/m2  SpO2 99% Nursing note and vital signs reviewed.  Physical Exam    Constitutional: She is oriented to person, place, and time. She appears well-developed and well-nourished. No distress.  Cardiovascular: Normal rate, regular rhythm, normal heart sounds and intact distal pulses.   Pulmonary/Chest: Effort normal and breath sounds normal.  Neurological: She is alert and oriented to person, place, and time.  Skin: Skin is warm and dry.  Psychiatric: She has a normal mood and affect. Her behavior is normal. Judgment and thought content normal.       Assessment & Plan:   Problem List Items Addressed This Visit      Other   Chronic pain - Primary (Chronic)    Chronic pain currently managed with oxycodone and Lyrica. Takes medications prescribed and denies adverse side effects. Indicates there is potential for improvement in some of the medication. Continue current dosage of oxycodone and Lyrica. Refer to pain management as she has not been able to establish with pain management to date. Follow-up if symptoms worsen or fail to improve. [place] controlled substance database reviewed with no irregularities. Due for UDS at next office visit.      Relevant Medications   Oxycodone HCl 10 MG TABS   pregabalin (LYRICA) 100 MG capsule   Other Relevant Orders   Ambulatory referral to Pain Clinic

## 2015-12-09 NOTE — Progress Notes (Signed)
Pre visit review using our clinic review tool, if applicable. No additional management support is needed unless otherwise documented below in the visit note. 

## 2015-12-09 NOTE — Assessment & Plan Note (Addendum)
Chronic pain currently managed with oxycodone and Lyrica. Takes medications prescribed and denies adverse side effects. Indicates there is potential for improvement in some of the medication. Continue current dosage of oxycodone and Lyrica. Refer to pain management as she has not been able to establish with pain management to date. Follow-up if symptoms worsen or fail to improve. [place] controlled substance database reviewed with no irregularities. Due for UDS at next office visit.

## 2015-12-25 ENCOUNTER — Ambulatory Visit (INDEPENDENT_AMBULATORY_CARE_PROVIDER_SITE_OTHER): Payer: Medicare Other | Admitting: General Practice

## 2015-12-25 DIAGNOSIS — Z7901 Long term (current) use of anticoagulants: Secondary | ICD-10-CM | POA: Diagnosis not present

## 2015-12-25 DIAGNOSIS — IMO0002 Reserved for concepts with insufficient information to code with codable children: Secondary | ICD-10-CM

## 2015-12-25 DIAGNOSIS — I2699 Other pulmonary embolism without acute cor pulmonale: Secondary | ICD-10-CM

## 2015-12-25 DIAGNOSIS — Z5181 Encounter for therapeutic drug level monitoring: Secondary | ICD-10-CM | POA: Diagnosis not present

## 2015-12-25 DIAGNOSIS — M329 Systemic lupus erythematosus, unspecified: Secondary | ICD-10-CM

## 2015-12-25 LAB — POCT INR: INR: 2.8

## 2015-12-25 NOTE — Progress Notes (Signed)
I have reviewed and agree with the plan. 

## 2015-12-25 NOTE — Progress Notes (Signed)
Pre visit review using our clinic review tool, if applicable. No additional management support is needed unless otherwise documented below in the visit note. 

## 2015-12-28 ENCOUNTER — Other Ambulatory Visit: Payer: Self-pay | Admitting: Family

## 2016-01-08 ENCOUNTER — Other Ambulatory Visit: Payer: Self-pay | Admitting: Family

## 2016-01-11 ENCOUNTER — Encounter: Payer: Self-pay | Admitting: Family

## 2016-01-11 ENCOUNTER — Ambulatory Visit (INDEPENDENT_AMBULATORY_CARE_PROVIDER_SITE_OTHER): Payer: Medicare Other | Admitting: Family

## 2016-01-11 VITALS — BP 144/90 | HR 96 | Temp 98.0°F | Resp 16 | Ht 71.0 in | Wt 243.0 lb

## 2016-01-11 DIAGNOSIS — Z79891 Long term (current) use of opiate analgesic: Secondary | ICD-10-CM | POA: Diagnosis not present

## 2016-01-11 DIAGNOSIS — G8929 Other chronic pain: Secondary | ICD-10-CM

## 2016-01-11 MED ORDER — BUPRENORPHINE 7.5 MCG/HR TD PTWK: 1.0000 | MEDICATED_PATCH | TRANSDERMAL | Status: DC

## 2016-01-11 MED ORDER — PREGABALIN 100 MG PO CAPS: 100.0000 mg | ORAL_CAPSULE | Freq: Two times a day (BID) | ORAL | Status: DC

## 2016-01-11 MED ORDER — OXYCODONE HCL 10 MG PO TABS: 10.0000 mg | ORAL_TABLET | Freq: Three times a day (TID) | ORAL | Status: DC | PRN

## 2016-01-11 NOTE — Progress Notes (Signed)
Pre visit review using our clinic review tool, if applicable. No additional management support is needed unless otherwise documented below in the visit note. 

## 2016-01-11 NOTE — Patient Instructions (Signed)
Thank you for choosing [facility].  Summary/Instructions:  Your prescription(s) have been submitted to your pharmacy or been printed and provided for you. Please take as directed and contact our office if you believe you are having problem(s) with the medication(s) or have any questions.  Please start the Butrans if able to afford. Decrease the oxycodone as able.   Continue to work on management referral.  If your symptoms worsen or fail to improve, please contact our office for further instruction, or in case of emergency go directly to the emergency room at the closest medical facility.

## 2016-01-11 NOTE — Progress Notes (Signed)
Subjective:    Patient ID: Crystal Frank, female    DOB: Apr 12, 1966, 50 y.o.   MRN: [id number]  Chief Complaint  Patient presents with  . Medication Management    HPI:  Crystal Frank is a 50 y.o. 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 and presents today for a follow up office visit.   1.) Chronic pain - currently maintained on oxycodone, duloxetine, and pregabalin. Reports taking the medication as prescribed and denies adverse side effects or constipation. Reports that her pain is not overtly controlled with the current regimen. Continues to experience the associated symptom of pain located in her ankles. Cannot stand for greater than 5 minutes at a time which makes her mobility very challenging. She is currently working with referral coordinators to access pain management.    No Known Allergies   Current Outpatient Prescriptions on File Prior to Visit  Medication Sig Dispense Refill  . DULoxetine (CYMBALTA) 60 MG capsule Take 1 capsule (60 mg total) by mouth daily. 90 capsule 0  . traZODone (DESYREL) 50 MG tablet TAKE 0.5-1 TABLETS (25-50 MG TOTAL) BY MOUTH AT BEDTIME AS NEEDED FOR SLEEP. 60 tablet 0  . warfarin (COUMADIN) 5 MG tablet TAKE AS DIRECTED BY  COUMADIN  CLINIC 80 tablet 3   No current facility-administered medications on file prior to visit.     Past Surgical History  Procedure Laterality Date  . Multiple orthopeadic surgeries      both ankles  . Ankle fusions      bilat fusions, multiple  . Hardware removal  09/25/2012    Procedure: HARDWARE REMOVAL;  Surgeon: Budd Palmer, MD;  Location: [facility] OR;  Service: Orthopedics;  Laterality: Left;  HARDWARE REMOVAL LEFT ANKLE   . Tibia osteotomy  09/25/2012    Procedure:  TIBIAL OSTEOTOMY;  Surgeon: Budd Palmer, MD;  Location: [facility] OR;  Service: Orthopedics;;  partial excision of tibia, placement of non-biodegradeable drug delivery device, left tibia  . Ankle arthrodesis  10/23/2012  . Ankle fusion  10/23/2012    Procedure: ARTHRODESIS ANKLE;  Surgeon: Budd Palmer, MD;  Location: [facility] OR;  Service: Orthopedics;  Laterality: Left;  REMOVAL RETAINED ANTIBIOTIC NAIL LEFT TIBIA/TIBIO TALAR FUSION    Review of Systems  Constitutional: Negative for fever and chills.  Musculoskeletal: Positive for arthralgias and gait problem. Negative for joint swelling.      Objective:    BP 144/90 mmHg  Pulse 96  Temp(Src) 98 F (36.7 C) (Oral)  Resp 16  Ht  (1.803 m)  Wt 243 lb (110.224 kg)  BMI 33.91 kg/m2  SpO2 99% Nursing note and vital signs reviewed.  Physical Exam  Constitutional: She is oriented to person, place, and time. She appears well-developed and well-nourished. No distress.  Cardiovascular: Normal rate, regular rhythm, normal heart sounds and intact distal pulses.   Pulmonary/Chest: Effort normal and breath sounds normal.  Neurological: She is alert and oriented to person, place, and time.  Skin: Skin is warm and dry.  Psychiatric: She has a normal mood and affect. Her behavior is normal. Judgment and thought content normal.       Assessment & Plan:   Problem List Items Addressed This Visit      Other   Chronic pain - Primary (Chronic)    Current  pain remains inadequately controlled with current regimen. Continues to work on admission to the pain clinic with complications by insurance. Continue current dosage of oxycodone, Lyrica, and Cymbalta. Start buprenorphrine. Obtain urine drug screen. [place] controlled substance database reviewed with no irregularities. Follow-up in one month.      Relevant Medications   Buprenorphine (BUTRANS) 7.5 MCG/HR PTWK   Oxycodone HCl 10 MG TABS   pregabalin (LYRICA) 100 MG capsule

## 2016-01-11 NOTE — Assessment & Plan Note (Signed)
Current pain remains inadequately controlled with current regimen. Continues to work on admission to the pain clinic with complications by insurance. Continue current dosage of oxycodone, Lyrica, and Cymbalta. Start buprenorphrine. Obtain urine drug screen. [place] controlled substance database reviewed with no irregularities. Follow-up in one month.

## 2016-02-04 ENCOUNTER — Encounter: Payer: Self-pay | Admitting: Family

## 2016-02-05 ENCOUNTER — Ambulatory Visit: Payer: Self-pay

## 2016-02-07 ENCOUNTER — Other Ambulatory Visit: Payer: Self-pay | Admitting: Family

## 2016-02-08 ENCOUNTER — Encounter: Payer: Self-pay | Admitting: Family

## 2016-02-08 ENCOUNTER — Ambulatory Visit (INDEPENDENT_AMBULATORY_CARE_PROVIDER_SITE_OTHER): Payer: Medicare Other | Admitting: Family

## 2016-02-08 VITALS — BP 136/92 | HR 91 | Temp 98.0°F | Resp 16 | Ht 71.0 in | Wt 238.0 lb

## 2016-02-08 DIAGNOSIS — G8929 Other chronic pain: Secondary | ICD-10-CM

## 2016-02-08 MED ORDER — PREGABALIN 100 MG PO CAPS: 100.0000 mg | ORAL_CAPSULE | Freq: Two times a day (BID) | ORAL | Status: AC

## 2016-02-08 MED ORDER — OXYCODONE HCL 10 MG PO TABS: 10.0000 mg | ORAL_TABLET | Freq: Three times a day (TID) | ORAL | Status: DC | PRN

## 2016-02-08 NOTE — Assessment & Plan Note (Signed)
Continues to experience chronic pain that was slightly improved with the Butrans, however had reactions to the medication and was unable to continue it. During her most recent drug screen she was noted to have hydrocodone in her urine with no evidence of oxycodone. Patient reports she has not been taking the medication as prescribed and obtain hydrocodone from a friend. It was explained that this is a violation of the controlled substance contract and no further controlled substances will be prescribed from this provider. A last prescription of oxycodone provided to prevent withdrawal. Follow up with pain management and as needed for primary care services.

## 2016-02-08 NOTE — Progress Notes (Signed)
Subjective:    Patient ID: Crystal ButtsJennifer L Frank, female    DOB: 01-29-1966, 50 y.o.   MRN: [id number]  Chief Complaint  Patient presents with  . Follow-up    wants to talk about the patches, seems like they are burning her    HPI:  Crystal ButtsJennifer L Duggar is a 50 y.o. 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 and presents today for an office follow up.  1.) Chronic pain - Previously prescribed bupinorphine in attempts to reduce pain medication and continues to take the oxycodone. She is currently having difficulty getting in to the pain management clinic secondary to insurance issues. She reports taking the medication as prescribed and notes that the buprenorphine may have caused her skin to break out. Continues to experience the associated symptom of pain located in her ankles. Cannot stand for greater than 5 minutes at a time which makes her mobility very challenging.   No Known Allergies   Current Outpatient Prescriptions on File Prior to Visit  Medication Sig Dispense Refill  . DULoxetine (CYMBALTA) 60 MG capsule TAKE 1 CAPSULE (60 MG TOTAL) BY MOUTH DAILY. 90 capsule 0  . traZODone (DESYREL) 50 MG tablet TAKE 0.5-1 TABLETS (25-50 MG TOTAL) BY MOUTH AT BEDTIME AS NEEDED FOR SLEEP. 60 tablet 0  . warfarin (COUMADIN) 5 MG tablet TAKE AS DIRECTED BY  COUMADIN  CLINIC 80 tablet 3   No current facility-administered medications on file prior to visit.    Review of Systems  Constitutional: Negative for fever and chills.  Cardiovascular: Negative for chest pain, palpitations and leg swelling.  Musculoskeletal:       Positive for chronic ankle pain.       Objective:    BP 136/92 mmHg  Pulse 91  Temp(Src) 98 F (36.7 C) (Oral)  Resp 16  Ht 5\' 11"  (1.803  m)  Wt 238 lb (107.956 kg)  BMI 33.21 kg/m2  SpO2 95% Nursing note and vital signs reviewed.  Physical Exam  Constitutional: She is oriented to person, place, and time. She appears well-developed and well-nourished. No distress.  Cardiovascular: Normal rate, regular rhythm, normal heart sounds and intact distal pulses.   Pulmonary/Chest: Effort normal and breath sounds normal.  Neurological: She is alert and oriented to person, place, and time.  Skin: Skin is warm and dry.  Psychiatric: She has a normal mood and affect. Her behavior is normal. Judgment and thought content normal.       Assessment & Plan:   Problem List Items Addressed This Visit      Other   Chronic pain - Primary (Chronic)    Continues to experience chronic pain that was slightly improved with the Butrans, however had reactions to the medication and was unable to continue it. During her most recent drug screen she was noted to have hydrocodone in her urine with no evidence of oxycodone. Patient reports she has not been taking the medication as prescribed and obtain hydrocodone from a friend. It was explained that this is a violation of the controlled substance contract and no further controlled substances will be prescribed from this provider. A last prescription of oxycodone provided to prevent withdrawal. Follow up with pain management and as needed for primary care services.       Relevant Medications   Oxycodone HCl 10 MG  TABS   pregabalin (LYRICA) 100 MG capsule

## 2016-02-08 NOTE — Progress Notes (Signed)
Pre visit review using our clinic review tool, if applicable. No additional management support is needed unless otherwise documented below in the visit note. 

## 2016-02-08 NOTE — Patient Instructions (Addendum)
Thank you for choosing [facility].  Summary/Instructions:  Please continue to take your medications as prescribed.   Due to a recent drug screen for medications that you were not prescribed and we are happy to provide primary care services but are unable to provide further controlled substances.   Your prescription(s) have been submitted to your pharmacy or been printed and provided for you. Please take as directed and contact our office if you believe you are having problem(s) with the medication(s) or have any questions.  If your symptoms worsen or fail to improve, please contact our office for further instruction, or in case of emergency go directly to the emergency room at the closest medical facility.

## 2016-02-10 ENCOUNTER — Telehealth: Payer: Self-pay | Admitting: *Deleted

## 2016-02-10 NOTE — Telephone Encounter (Signed)
Left msg on triage stating pt is new with there company she is needing refill on her Lyrica & Trazodone...[name]/lmb

## 2016-02-11 MED ORDER — TRAZODONE HCL 50 MG PO TABS: ORAL_TABLET | ORAL | Status: AC

## 2016-02-11 NOTE — Telephone Encounter (Signed)
Trazodone sent to pharmacy. Lyrica denied as she just had it filled 2 days ago.

## 2016-02-24 ENCOUNTER — Telehealth: Payer: Self-pay | Admitting: Family

## 2016-02-24 NOTE — Telephone Encounter (Signed)
Noted! Thank you

## 2016-02-24 NOTE — Telephone Encounter (Signed)
Patient called to advise that she has located a pain management clinic that is willing to see her case.   [facility] Address: [address], [place], [place] [address] Phone: [phone] Fax: [phone]

## 2016-03-06 ENCOUNTER — Other Ambulatory Visit: Payer: Self-pay | Admitting: Family

## 2016-03-14 ENCOUNTER — Other Ambulatory Visit: Payer: Self-pay

## 2016-03-14 MED ORDER — DULOXETINE HCL 60 MG PO CPEP: ORAL_CAPSULE | ORAL | Status: DC

## 2016-03-29 DIAGNOSIS — Z7901 Long term (current) use of anticoagulants: Secondary | ICD-10-CM | POA: Diagnosis not present

## 2016-03-29 DIAGNOSIS — M25579 Pain in unspecified ankle and joints of unspecified foot: Secondary | ICD-10-CM | POA: Diagnosis not present

## 2016-03-29 DIAGNOSIS — I2699 Other pulmonary embolism without acute cor pulmonale: Secondary | ICD-10-CM | POA: Diagnosis not present

## 2016-03-29 DIAGNOSIS — G8929 Other chronic pain: Secondary | ICD-10-CM | POA: Diagnosis not present

## 2016-03-29 DIAGNOSIS — F324 Major depressive disorder, single episode, in partial remission: Secondary | ICD-10-CM | POA: Diagnosis not present

## 2016-03-29 DIAGNOSIS — M3219 Other organ or system involvement in systemic lupus erythematosus: Secondary | ICD-10-CM | POA: Diagnosis not present

## 2016-04-29 DIAGNOSIS — M5441 Lumbago with sciatica, right side: Secondary | ICD-10-CM | POA: Diagnosis not present

## 2016-04-29 DIAGNOSIS — M25579 Pain in unspecified ankle and joints of unspecified foot: Secondary | ICD-10-CM | POA: Diagnosis not present

## 2016-04-29 DIAGNOSIS — Z7901 Long term (current) use of anticoagulants: Secondary | ICD-10-CM | POA: Diagnosis not present

## 2016-04-29 DIAGNOSIS — I2699 Other pulmonary embolism without acute cor pulmonale: Secondary | ICD-10-CM | POA: Diagnosis not present

## 2016-04-29 DIAGNOSIS — G8929 Other chronic pain: Secondary | ICD-10-CM | POA: Diagnosis not present

## 2016-05-30 DIAGNOSIS — M5441 Lumbago with sciatica, right side: Secondary | ICD-10-CM | POA: Diagnosis not present

## 2016-05-30 DIAGNOSIS — Z5181 Encounter for therapeutic drug level monitoring: Secondary | ICD-10-CM | POA: Diagnosis not present

## 2016-05-30 DIAGNOSIS — F324 Major depressive disorder, single episode, in partial remission: Secondary | ICD-10-CM | POA: Diagnosis not present

## 2016-05-30 DIAGNOSIS — I2699 Other pulmonary embolism without acute cor pulmonale: Secondary | ICD-10-CM | POA: Diagnosis not present

## 2016-05-30 DIAGNOSIS — Z79899 Other long term (current) drug therapy: Secondary | ICD-10-CM | POA: Diagnosis not present

## 2016-05-30 DIAGNOSIS — G8929 Other chronic pain: Secondary | ICD-10-CM | POA: Diagnosis not present

## 2016-06-08 DIAGNOSIS — Z79899 Other long term (current) drug therapy: Secondary | ICD-10-CM | POA: Diagnosis not present

## 2016-06-09 DIAGNOSIS — I2699 Other pulmonary embolism without acute cor pulmonale: Secondary | ICD-10-CM | POA: Diagnosis not present

## 2016-06-15 DIAGNOSIS — Z79899 Other long term (current) drug therapy: Secondary | ICD-10-CM | POA: Diagnosis not present

## 2016-06-22 DIAGNOSIS — Z79899 Other long term (current) drug therapy: Secondary | ICD-10-CM | POA: Diagnosis not present

## 2016-06-29 DIAGNOSIS — Z79899 Other long term (current) drug therapy: Secondary | ICD-10-CM | POA: Diagnosis not present

## 2016-07-12 ENCOUNTER — Other Ambulatory Visit: Payer: Self-pay

## 2016-07-12 DIAGNOSIS — I2699 Other pulmonary embolism without acute cor pulmonale: Secondary | ICD-10-CM | POA: Diagnosis not present

## 2016-07-12 MED ORDER — DULOXETINE HCL 60 MG PO CPEP: ORAL_CAPSULE | ORAL | 0 refills | Status: DC

## 2016-07-14 DIAGNOSIS — I2699 Other pulmonary embolism without acute cor pulmonale: Secondary | ICD-10-CM | POA: Diagnosis not present

## 2016-07-18 DIAGNOSIS — I2699 Other pulmonary embolism without acute cor pulmonale: Secondary | ICD-10-CM | POA: Diagnosis not present

## 2016-07-27 DIAGNOSIS — I2699 Other pulmonary embolism without acute cor pulmonale: Secondary | ICD-10-CM | POA: Diagnosis not present

## 2016-07-27 DIAGNOSIS — M5441 Lumbago with sciatica, right side: Secondary | ICD-10-CM | POA: Diagnosis not present

## 2016-07-27 DIAGNOSIS — Z79899 Other long term (current) drug therapy: Secondary | ICD-10-CM | POA: Diagnosis not present

## 2016-07-27 DIAGNOSIS — Z23 Encounter for immunization: Secondary | ICD-10-CM | POA: Diagnosis not present

## 2016-07-27 DIAGNOSIS — G8929 Other chronic pain: Secondary | ICD-10-CM | POA: Diagnosis not present

## 2016-07-27 DIAGNOSIS — E782 Mixed hyperlipidemia: Secondary | ICD-10-CM | POA: Diagnosis not present

## 2016-08-03 DIAGNOSIS — I2699 Other pulmonary embolism without acute cor pulmonale: Secondary | ICD-10-CM | POA: Diagnosis not present

## 2016-08-17 DIAGNOSIS — I2699 Other pulmonary embolism without acute cor pulmonale: Secondary | ICD-10-CM | POA: Diagnosis not present

## 2016-08-26 DIAGNOSIS — I2782 Chronic pulmonary embolism: Secondary | ICD-10-CM | POA: Diagnosis not present

## 2016-09-14 DIAGNOSIS — I2782 Chronic pulmonary embolism: Secondary | ICD-10-CM | POA: Diagnosis not present

## 2016-09-16 ENCOUNTER — Ambulatory Visit: Payer: Self-pay | Admitting: Adult Health

## 2016-09-21 DIAGNOSIS — G8921 Chronic pain due to trauma: Secondary | ICD-10-CM | POA: Diagnosis not present

## 2016-09-21 DIAGNOSIS — G894 Chronic pain syndrome: Secondary | ICD-10-CM | POA: Diagnosis not present

## 2016-09-21 DIAGNOSIS — G8929 Other chronic pain: Secondary | ICD-10-CM | POA: Diagnosis not present

## 2016-10-06 ENCOUNTER — Other Ambulatory Visit: Payer: Self-pay | Admitting: Family

## 2016-10-13 DIAGNOSIS — I2782 Chronic pulmonary embolism: Secondary | ICD-10-CM | POA: Diagnosis not present

## 2016-10-17 ENCOUNTER — Ambulatory Visit: Payer: Self-pay | Admitting: General Practice

## 2016-10-26 DIAGNOSIS — Z7901 Long term (current) use of anticoagulants: Secondary | ICD-10-CM | POA: Diagnosis not present

## 2016-10-26 DIAGNOSIS — G894 Chronic pain syndrome: Secondary | ICD-10-CM | POA: Diagnosis not present

## 2016-10-26 DIAGNOSIS — I2699 Other pulmonary embolism without acute cor pulmonale: Secondary | ICD-10-CM | POA: Diagnosis not present

## 2016-10-26 DIAGNOSIS — G8921 Chronic pain due to trauma: Secondary | ICD-10-CM | POA: Diagnosis not present

## 2016-10-26 DIAGNOSIS — E785 Hyperlipidemia, unspecified: Secondary | ICD-10-CM | POA: Diagnosis not present

## 2016-10-26 DIAGNOSIS — Z6831 Body mass index (BMI) 31.0-31.9, adult: Secondary | ICD-10-CM | POA: Diagnosis not present

## 2016-11-09 DIAGNOSIS — I2782 Chronic pulmonary embolism: Secondary | ICD-10-CM | POA: Diagnosis not present

## 2017-01-04 DIAGNOSIS — I2782 Chronic pulmonary embolism: Secondary | ICD-10-CM | POA: Diagnosis not present

## 2017-01-05 ENCOUNTER — Other Ambulatory Visit: Payer: Self-pay | Admitting: Family

## 2017-01-18 DIAGNOSIS — Z79899 Other long term (current) drug therapy: Secondary | ICD-10-CM | POA: Diagnosis not present

## 2017-01-18 DIAGNOSIS — G894 Chronic pain syndrome: Secondary | ICD-10-CM | POA: Diagnosis not present

## 2017-01-25 DIAGNOSIS — G8929 Other chronic pain: Secondary | ICD-10-CM | POA: Diagnosis not present

## 2017-01-25 DIAGNOSIS — I2699 Other pulmonary embolism without acute cor pulmonale: Secondary | ICD-10-CM | POA: Diagnosis not present

## 2017-01-25 DIAGNOSIS — M5441 Lumbago with sciatica, right side: Secondary | ICD-10-CM | POA: Diagnosis not present

## 2017-01-25 DIAGNOSIS — E782 Mixed hyperlipidemia: Secondary | ICD-10-CM | POA: Diagnosis not present

## 2017-02-01 DIAGNOSIS — G8929 Other chronic pain: Secondary | ICD-10-CM | POA: Diagnosis not present

## 2017-02-01 DIAGNOSIS — Z79899 Other long term (current) drug therapy: Secondary | ICD-10-CM | POA: Diagnosis not present

## 2017-02-01 DIAGNOSIS — Z981 Arthrodesis status: Secondary | ICD-10-CM | POA: Diagnosis not present

## 2017-02-01 DIAGNOSIS — M545 Low back pain: Secondary | ICD-10-CM | POA: Diagnosis not present

## 2017-02-01 DIAGNOSIS — M818 Other osteoporosis without current pathological fracture: Secondary | ICD-10-CM | POA: Diagnosis not present

## 2017-02-01 DIAGNOSIS — M25572 Pain in left ankle and joints of left foot: Secondary | ICD-10-CM | POA: Diagnosis not present

## 2017-02-01 DIAGNOSIS — I2782 Chronic pulmonary embolism: Secondary | ICD-10-CM | POA: Diagnosis not present

## 2017-02-10 ENCOUNTER — Other Ambulatory Visit: Payer: Self-pay | Admitting: *Deleted

## 2017-03-01 DIAGNOSIS — I2782 Chronic pulmonary embolism: Secondary | ICD-10-CM | POA: Diagnosis not present

## 2017-03-29 DIAGNOSIS — I2782 Chronic pulmonary embolism: Secondary | ICD-10-CM | POA: Diagnosis not present

## 2017-04-17 DIAGNOSIS — M25579 Pain in unspecified ankle and joints of unspecified foot: Secondary | ICD-10-CM | POA: Diagnosis not present

## 2017-04-17 DIAGNOSIS — Z79899 Other long term (current) drug therapy: Secondary | ICD-10-CM | POA: Diagnosis not present

## 2017-04-17 DIAGNOSIS — M19079 Primary osteoarthritis, unspecified ankle and foot: Secondary | ICD-10-CM | POA: Diagnosis not present

## 2017-04-17 DIAGNOSIS — Z981 Arthrodesis status: Secondary | ICD-10-CM | POA: Diagnosis not present

## 2017-04-17 DIAGNOSIS — G8929 Other chronic pain: Secondary | ICD-10-CM | POA: Diagnosis not present

## 2017-04-27 DIAGNOSIS — I2782 Chronic pulmonary embolism: Secondary | ICD-10-CM | POA: Diagnosis not present

## 2017-05-01 DIAGNOSIS — M19079 Primary osteoarthritis, unspecified ankle and foot: Secondary | ICD-10-CM | POA: Diagnosis not present

## 2017-05-01 DIAGNOSIS — Z981 Arthrodesis status: Secondary | ICD-10-CM | POA: Diagnosis not present

## 2017-05-01 DIAGNOSIS — Z79899 Other long term (current) drug therapy: Secondary | ICD-10-CM | POA: Diagnosis not present

## 2017-05-02 DIAGNOSIS — Z0000 Encounter for general adult medical examination without abnormal findings: Secondary | ICD-10-CM | POA: Diagnosis not present

## 2017-05-02 DIAGNOSIS — E785 Hyperlipidemia, unspecified: Secondary | ICD-10-CM | POA: Diagnosis not present

## 2017-05-02 DIAGNOSIS — G894 Chronic pain syndrome: Secondary | ICD-10-CM | POA: Diagnosis not present

## 2017-05-02 DIAGNOSIS — M5440 Lumbago with sciatica, unspecified side: Secondary | ICD-10-CM | POA: Diagnosis not present

## 2017-05-02 DIAGNOSIS — Z1389 Encounter for screening for other disorder: Secondary | ICD-10-CM | POA: Diagnosis not present

## 2017-05-24 DIAGNOSIS — I2782 Chronic pulmonary embolism: Secondary | ICD-10-CM | POA: Diagnosis not present

## 2017-05-31 DIAGNOSIS — I10 Essential (primary) hypertension: Secondary | ICD-10-CM | POA: Diagnosis not present

## 2017-05-31 DIAGNOSIS — G8929 Other chronic pain: Secondary | ICD-10-CM | POA: Diagnosis not present

## 2017-05-31 DIAGNOSIS — R000 Tachycardia, unspecified: Secondary | ICD-10-CM | POA: Diagnosis not present

## 2017-06-07 DIAGNOSIS — Z1231 Encounter for screening mammogram for malignant neoplasm of breast: Secondary | ICD-10-CM | POA: Diagnosis not present

## 2017-06-07 DIAGNOSIS — R009 Unspecified abnormalities of heart beat: Secondary | ICD-10-CM | POA: Diagnosis not present

## 2017-06-07 DIAGNOSIS — I10 Essential (primary) hypertension: Secondary | ICD-10-CM | POA: Diagnosis not present

## 2017-06-22 DIAGNOSIS — M549 Dorsalgia, unspecified: Secondary | ICD-10-CM | POA: Diagnosis not present

## 2017-06-22 DIAGNOSIS — Z720 Tobacco use: Secondary | ICD-10-CM | POA: Diagnosis not present

## 2017-06-22 DIAGNOSIS — I2782 Chronic pulmonary embolism: Secondary | ICD-10-CM | POA: Diagnosis not present

## 2017-06-22 DIAGNOSIS — M25571 Pain in right ankle and joints of right foot: Secondary | ICD-10-CM | POA: Diagnosis not present

## 2017-06-22 DIAGNOSIS — G894 Chronic pain syndrome: Secondary | ICD-10-CM | POA: Diagnosis not present

## 2017-07-20 DIAGNOSIS — I2782 Chronic pulmonary embolism: Secondary | ICD-10-CM | POA: Diagnosis not present

## 2017-07-27 DIAGNOSIS — M549 Dorsalgia, unspecified: Secondary | ICD-10-CM | POA: Diagnosis not present

## 2017-07-27 DIAGNOSIS — Z720 Tobacco use: Secondary | ICD-10-CM | POA: Diagnosis not present

## 2017-07-27 DIAGNOSIS — M25571 Pain in right ankle and joints of right foot: Secondary | ICD-10-CM | POA: Diagnosis not present

## 2017-08-18 DIAGNOSIS — I2782 Chronic pulmonary embolism: Secondary | ICD-10-CM | POA: Diagnosis not present

## 2017-08-29 DIAGNOSIS — G894 Chronic pain syndrome: Secondary | ICD-10-CM | POA: Diagnosis not present

## 2017-08-29 DIAGNOSIS — M549 Dorsalgia, unspecified: Secondary | ICD-10-CM | POA: Diagnosis not present

## 2017-08-29 DIAGNOSIS — M25571 Pain in right ankle and joints of right foot: Secondary | ICD-10-CM | POA: Diagnosis not present

## 2017-08-29 DIAGNOSIS — Z720 Tobacco use: Secondary | ICD-10-CM | POA: Diagnosis not present

## 2017-09-17 DIAGNOSIS — I2782 Chronic pulmonary embolism: Secondary | ICD-10-CM | POA: Diagnosis not present

## 2017-09-29 ENCOUNTER — Inpatient Hospital Stay (HOSPITAL_COMMUNITY)
Admission: EM | Admit: 2017-09-29 | Discharge: 2017-10-01 | DRG: 918 | Disposition: A | Discharge status: Home / Self Care | Payer: Medicare Other | Attending: Family Medicine | Admitting: Family Medicine

## 2017-09-29 ENCOUNTER — Emergency Department (HOSPITAL_COMMUNITY): Payer: Medicare Other

## 2017-09-29 ENCOUNTER — Encounter (HOSPITAL_COMMUNITY): Payer: Self-pay | Admitting: Nurse Practitioner

## 2017-09-29 DIAGNOSIS — M6282 Rhabdomyolysis: Secondary | ICD-10-CM | POA: Diagnosis present

## 2017-09-29 DIAGNOSIS — I2782 Chronic pulmonary embolism: Secondary | ICD-10-CM | POA: Diagnosis not present

## 2017-09-29 DIAGNOSIS — X58XXXA Exposure to other specified factors, initial encounter: Secondary | ICD-10-CM | POA: Diagnosis present

## 2017-09-29 DIAGNOSIS — H9193 Unspecified hearing loss, bilateral: Secondary | ICD-10-CM | POA: Diagnosis not present

## 2017-09-29 DIAGNOSIS — M549 Dorsalgia, unspecified: Secondary | ICD-10-CM | POA: Diagnosis not present

## 2017-09-29 DIAGNOSIS — F1910 Other psychoactive substance abuse, uncomplicated: Secondary | ICD-10-CM | POA: Diagnosis not present

## 2017-09-29 DIAGNOSIS — E875 Hyperkalemia: Secondary | ICD-10-CM

## 2017-09-29 DIAGNOSIS — M329 Systemic lupus erythematosus, unspecified: Secondary | ICD-10-CM | POA: Diagnosis present

## 2017-09-29 DIAGNOSIS — A419 Sepsis, unspecified organism: Secondary | ICD-10-CM | POA: Diagnosis not present

## 2017-09-29 DIAGNOSIS — Z23 Encounter for immunization: Secondary | ICD-10-CM | POA: Diagnosis present

## 2017-09-29 DIAGNOSIS — F32A Depression, unspecified: Secondary | ICD-10-CM | POA: Diagnosis present

## 2017-09-29 DIAGNOSIS — M25571 Pain in right ankle and joints of right foot: Secondary | ICD-10-CM | POA: Diagnosis not present

## 2017-09-29 DIAGNOSIS — Z720 Tobacco use: Secondary | ICD-10-CM | POA: Diagnosis not present

## 2017-09-29 DIAGNOSIS — N179 Acute kidney failure, unspecified: Secondary | ICD-10-CM

## 2017-09-29 DIAGNOSIS — G8929 Other chronic pain: Secondary | ICD-10-CM | POA: Diagnosis present

## 2017-09-29 DIAGNOSIS — T50902D Poisoning by unspecified drugs, medicaments and biological substances, intentional self-harm, subsequent encounter: Secondary | ICD-10-CM | POA: Diagnosis not present

## 2017-09-29 DIAGNOSIS — I2699 Other pulmonary embolism without acute cor pulmonale: Secondary | ICD-10-CM

## 2017-09-29 DIAGNOSIS — T402X1A Poisoning by other opioids, accidental (unintentional), initial encounter: Principal | ICD-10-CM | POA: Diagnosis present

## 2017-09-29 DIAGNOSIS — Z86711 Personal history of pulmonary embolism: Secondary | ICD-10-CM | POA: Diagnosis not present

## 2017-09-29 DIAGNOSIS — T50904A Poisoning by unspecified drugs, medicaments and biological substances, undetermined, initial encounter: Secondary | ICD-10-CM | POA: Diagnosis not present

## 2017-09-29 DIAGNOSIS — Z79899 Other long term (current) drug therapy: Secondary | ICD-10-CM | POA: Diagnosis not present

## 2017-09-29 DIAGNOSIS — I10 Essential (primary) hypertension: Secondary | ICD-10-CM | POA: Diagnosis present

## 2017-09-29 DIAGNOSIS — F419 Anxiety disorder, unspecified: Secondary | ICD-10-CM | POA: Diagnosis present

## 2017-09-29 DIAGNOSIS — Z7901 Long term (current) use of anticoagulants: Secondary | ICD-10-CM | POA: Diagnosis not present

## 2017-09-29 DIAGNOSIS — F329 Major depressive disorder, single episode, unspecified: Secondary | ICD-10-CM | POA: Diagnosis present

## 2017-09-29 DIAGNOSIS — F1010 Alcohol abuse, uncomplicated: Secondary | ICD-10-CM

## 2017-09-29 DIAGNOSIS — H9190 Unspecified hearing loss, unspecified ear: Secondary | ICD-10-CM

## 2017-09-29 DIAGNOSIS — T50901A Poisoning by unspecified drugs, medicaments and biological substances, accidental (unintentional), initial encounter: Secondary | ICD-10-CM | POA: Diagnosis not present

## 2017-09-29 DIAGNOSIS — R918 Other nonspecific abnormal finding of lung field: Secondary | ICD-10-CM | POA: Diagnosis not present

## 2017-09-29 DIAGNOSIS — F17210 Nicotine dependence, cigarettes, uncomplicated: Secondary | ICD-10-CM | POA: Diagnosis present

## 2017-09-29 DIAGNOSIS — G894 Chronic pain syndrome: Secondary | ICD-10-CM | POA: Diagnosis not present

## 2017-09-29 DIAGNOSIS — R6510 Systemic inflammatory response syndrome (SIRS) of non-infectious origin without acute organ dysfunction: Secondary | ICD-10-CM | POA: Diagnosis present

## 2017-09-29 HISTORY — DX: Bipolar disorder, unspecified: F31.9

## 2017-09-29 HISTORY — DX: Type 2 diabetes mellitus without complications: E11.9

## 2017-09-29 HISTORY — DX: Myoneural disorder, unspecified: G70.9

## 2017-09-29 HISTORY — DX: Unspecified convulsions: R56.9

## 2017-09-29 LAB — COMPREHENSIVE METABOLIC PANEL
ALT: 71 U/L — ABNORMAL HIGH (ref 14–54)
AST: 143 U/L — ABNORMAL HIGH (ref 15–41)
Albumin: 4.3 g/dL (ref 3.5–5.0)
Alkaline Phosphatase: 65 U/L (ref 38–126)
Anion gap: 14 (ref 5–15)
BUN: 22 mg/dL — ABNORMAL HIGH (ref 6–20)
CO2: 18 mmol/L — ABNORMAL LOW (ref 22–32)
Calcium: 8.3 mg/dL — ABNORMAL LOW (ref 8.9–10.3)
Chloride: 102 mmol/L (ref 101–111)
Creatinine, Ser: 2.5 mg/dL — ABNORMAL HIGH (ref 0.44–1.00)
GFR calc Af Amer: 25 mL/min — ABNORMAL LOW (ref >60)
GFR calc non Af Amer: 21 mL/min — ABNORMAL LOW (ref >60)
Glucose, Bld: 70 mg/dL (ref 65–99)
Potassium: 5.9 mmol/L — ABNORMAL HIGH (ref 3.5–5.1)
Sodium: 134 mmol/L — ABNORMAL LOW (ref 135–145)
Total Bilirubin: 0.8 mg/dL (ref 0.3–1.2)
Total Protein: 7.8 g/dL (ref 6.5–8.1)

## 2017-09-29 LAB — CBC WITH DIFFERENTIAL/PLATELET
Basophils Absolute: 0 10*3/uL (ref 0.0–0.1)
Basophils Relative: 0 %
Eosinophils Absolute: 0 10*3/uL (ref 0.0–0.7)
Eosinophils Relative: 0 %
HCT: 45.2 % (ref 36.0–46.0)
Hemoglobin: 15.6 g/dL — ABNORMAL HIGH (ref 12.0–15.0)
Lymphocytes Relative: 7 %
Lymphs Abs: 3 10*3/uL (ref 0.7–4.0)
MCH: 29.8 pg (ref 26.0–34.0)
MCHC: 34.5 g/dL (ref 30.0–36.0)
MCV: 86.3 fL (ref 78.0–100.0)
Monocytes Absolute: 3.4 10*3/uL — ABNORMAL HIGH (ref 0.1–1.0)
Monocytes Relative: 8 %
Neutro Abs: 35.8 10*3/uL — ABNORMAL HIGH (ref 1.7–7.7)
Neutrophils Relative %: 85 %
Platelets: 297 10*3/uL (ref 150–400)
RBC: 5.24 MIL/uL — ABNORMAL HIGH (ref 3.87–5.11)
RDW: 16.1 % — ABNORMAL HIGH (ref 11.5–15.5)
WBC: 42.2 10*3/uL — ABNORMAL HIGH (ref 4.0–10.5)

## 2017-09-29 LAB — CBG MONITORING, ED: Glucose-Capillary: 64 mg/dL — ABNORMAL LOW (ref 65–99)

## 2017-09-29 LAB — SALICYLATE LEVEL: Salicylate Lvl: <7 mg/dL (ref 2.8–30.0)

## 2017-09-29 LAB — ETHANOL: Alcohol, Ethyl (B): <5 mg/dL (ref <10)

## 2017-09-29 LAB — I-STAT BETA HCG BLOOD, ED (MC, WL, AP ONLY): I-stat hCG, quantitative: <5 m[IU]/mL (ref <5)

## 2017-09-29 LAB — ACETAMINOPHEN LEVEL: Acetaminophen (Tylenol), Serum: 10 ug/mL (ref 10–30)

## 2017-09-29 MED ORDER — DULOXETINE HCL 30 MG PO CPEP
30.0000 mg | ORAL_CAPSULE | Freq: Every day | ORAL | Status: DC
  Administered 2017-09-30 – 2017-10-01 (×2): 30 mg via ORAL
  Filled 2017-09-29 (×2): qty 1

## 2017-09-29 MED ORDER — SODIUM CHLORIDE 0.9 % IV BOLUS (SEPSIS)
1000.0000 mL | Freq: Once | INTRAVENOUS | Status: AC
  Administered 2017-09-29: 1000 mL via INTRAVENOUS

## 2017-09-29 MED ORDER — VANCOMYCIN HCL IN DEXTROSE 1-5 GM/200ML-% IV SOLN
1000.0000 mg | Freq: Once | INTRAVENOUS | Status: AC
  Administered 2017-09-30: 1000 mg via INTRAVENOUS
  Filled 2017-09-29: qty 200

## 2017-09-29 MED ORDER — ACETAMINOPHEN 650 MG RE SUPP: 650.0000 mg | Freq: Four times a day (QID) | RECTAL | Status: DC | PRN

## 2017-09-29 MED ORDER — ACETAMINOPHEN 325 MG PO TABS
650.0000 mg | ORAL_TABLET | Freq: Four times a day (QID) | ORAL | Status: DC | PRN
Start: 2017-09-29 — End: 2017-10-01

## 2017-09-29 MED ORDER — SODIUM CHLORIDE 0.9 % IV BOLUS (SEPSIS)
1000.0000 mL | Freq: Once | INTRAVENOUS | Status: AC
  Administered 2017-09-30: 1000 mL via INTRAVENOUS

## 2017-09-29 MED ORDER — PIPERACILLIN-TAZOBACTAM 3.375 G IVPB 30 MIN
3.3750 g | Freq: Once | INTRAVENOUS | Status: AC
  Administered 2017-09-30: 3.375 g via INTRAVENOUS

## 2017-09-29 MED ORDER — TRAZODONE HCL 50 MG PO TABS: 50.0000 mg | ORAL_TABLET | Freq: Every evening | ORAL | Status: DC | PRN

## 2017-09-29 MED ORDER — LORAZEPAM 2 MG/ML IJ SOLN: 2.0000 mg | INTRAMUSCULAR | Status: DC | PRN

## 2017-09-29 MED ORDER — ONDANSETRON HCL 4 MG PO TABS: 4.0000 mg | ORAL_TABLET | Freq: Four times a day (QID) | ORAL | Status: DC | PRN

## 2017-09-29 MED ORDER — BISACODYL 5 MG PO TBEC: 5.0000 mg | DELAYED_RELEASE_TABLET | Freq: Every day | ORAL | Status: DC | PRN

## 2017-09-29 MED ORDER — PREGABALIN 100 MG PO CAPS
100.0000 mg | ORAL_CAPSULE | Freq: Two times a day (BID) | ORAL | Status: DC
  Administered 2017-09-30 – 2017-10-01 (×3): 100 mg via ORAL
  Filled 2017-09-29 (×3): qty 1

## 2017-09-29 MED ORDER — SODIUM CHLORIDE 0.9% FLUSH
3.0000 mL | Freq: Two times a day (BID) | INTRAVENOUS | Status: DC
  Administered 2017-09-30 (×2): 3 mL via INTRAVENOUS

## 2017-09-29 MED ORDER — OXYCODONE HCL 5 MG PO TABS: 10.0000 mg | ORAL_TABLET | Freq: Three times a day (TID) | ORAL | Status: DC | PRN

## 2017-09-29 MED ORDER — ADULT MULTIVITAMIN W/MINERALS CH
1.0000 | ORAL_TABLET | Freq: Every day | ORAL | Status: DC
  Administered 2017-09-30 – 2017-10-01 (×2): 1 via ORAL
  Filled 2017-09-29 (×2): qty 1

## 2017-09-29 MED ORDER — ONDANSETRON HCL 4 MG/2ML IJ SOLN: 4.0000 mg | Freq: Four times a day (QID) | INTRAMUSCULAR | Status: DC | PRN

## 2017-09-29 MED ORDER — SODIUM CHLORIDE 0.9 % IV SOLN
INTRAVENOUS | Status: DC
  Administered 2017-09-30: 07:00:00 via INTRAVENOUS

## 2017-09-29 MED ORDER — SENNOSIDES-DOCUSATE SODIUM 8.6-50 MG PO TABS: 1.0000 | ORAL_TABLET | Freq: Every evening | ORAL | Status: DC | PRN

## 2017-09-29 MED ORDER — VITAMIN B-1 100 MG PO TABS
100.0000 mg | ORAL_TABLET | Freq: Every day | ORAL | Status: DC
  Administered 2017-09-30 – 2017-10-01 (×2): 100 mg via ORAL
  Filled 2017-09-29 (×2): qty 1

## 2017-09-29 MED ORDER — HEPARIN SODIUM (PORCINE) 5000 UNIT/ML IJ SOLN: 5000.0000 [IU] | Freq: Three times a day (TID) | INTRAMUSCULAR | Status: DC

## 2017-09-29 MED ORDER — FOLIC ACID 1 MG PO TABS
1.0000 mg | ORAL_TABLET | Freq: Every day | ORAL | Status: DC
  Administered 2017-09-30 – 2017-10-01 (×2): 1 mg via ORAL
  Filled 2017-09-29 (×2): qty 1

## 2017-09-29 NOTE — ED Triage Notes (Signed)
Per EMS pt found at home supine-  Found unconscious by family who did CPR for approximately 5 min, patient had agonal respirations upon fires arrival. Patient unconscious but breathing upn EMS arrival- EMS administered 1mg  of narcan and patient became more responsive, pupils size increased- equal and reactive. Posturing noted in bilateral upper extremities. Patient flinching when NPA placed. Pt on NRB. Radial pulses faint.   Patient has hx of chronic pain with rx of oxycodone, DM, blood clots, heroin use,   20 G left AC

## 2017-09-29 NOTE — ED Notes (Signed)
Patient transported to CT 

## 2017-09-29 NOTE — H&P (Signed)
History and Physical    IXEL BOEHNING WUJ:[id number] DOB: 12-Aug-1966 DOA: 09/29/2017  PCP: Veryl Speak, FNP   Patient coming from: Home  Chief Complaint: Found unresponsive   HPI: Crystal Frank is a 51 y.o. female with medical history significant for depression, anxiety, chronic pain, history of pulmonary embolism on warfarin, and polysubstance abuse, now presenting to the emergency department after she was found unresponsive by family.  Patient had reportedly been in her usual state, though complaining of intermittent hearing difficulties, and was later found lying on the floor, unresponsive.  Family called 911 and began trying to revive the patient.  On EMS arrival, the patient was poorly responsive, had pinpoint pupils, and poor respiratory effort.  She was treated with Narcan, became more responsive and began breathing more regularly.  She was transported to the hospital.  ED Course: Upon arrival to the ED, patient is found to be febrile to 38.1 degree C, tachycardic, and with vitals otherwise stable.  EKG features a sinus tachycardia with rate 106.  Noncontrast head CT is negative for acute intracranial abnormality.  Ethanol level is undetectable, so his salicylate and acetaminophen levels.  Chemistry panel is notable for sodium of 134, potassium 5.9, and creatinine of 2.50, up from 0.8 range on remote priors.  CBC features a leukocytosis to 42,200 with mild left shift.  Patient was treated with 2 L of normal saline in the emergency department.  She continued to become more alert and interactive, blood pressure remained stable, she has not been in acute respiratory distress, and will be admitted to the stepdown unit for ongoing evaluation and management of unresponsive episode, with suspected secondary to opiate overdose, as well as SIRS and AKI with hyperkalemia.    Review of Systems:  All other systems reviewed and apart from HPI, are negative.  Past Medical History:  Diagnosis  Date  . Anxiety   . Arthritis   . Chronic pain   . Depression   . Hypertension   . Lupus   . 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 Osteomyelitis (HCC)   . 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 Pleurisy   . Polysubstance abuse (HCC)   . Pulmonary embolism (HCC) 2008   seen at [facility]--right lung    Past Surgical History:  Procedure Laterality Date  . ANKLE ARTHRODESIS  10/23/2012  . ankle fusions     bilat fusions, multiple  . multiple orthopeadic surgeries     both ankles     reports that she has been smoking cigarettes.  She has a 17.50 pack-year smoking history. she has never used smokeless tobacco. She reports that she drinks about 2.4 oz of alcohol per week. She reports that she uses drugs. Drugs: Oxycodone, Cocaine, and Marijuana.  No Known Allergies  Family History  Problem Relation Age of Onset  . Healthy Mother   . Hypertension Father   . Diabetes Father   . Mental illness Father   . Arthritis Maternal Grandmother   . Healthy Maternal Grandfather   . Healthy Paternal Grandmother   . Healthy Paternal Grandfather   . Clotting disorder Paternal Aunt   . Cancer Maternal Uncle      Prior to Admission medications   Medication Sig Start Date End Date Taking? Authorizing Provider  DULoxetine (CYMBALTA) 60 MG capsule TAKE 1 CAPSULE (60 MG TOTAL) BY MOUTH DAILY. 01/06/17   Veryl Speak, FNP  Oxycodone HCl 10 MG TABS Take 1 tablet (10 mg total) by mouth  3 (three) times daily as needed. 02/08/16   Veryl Speak, FNP  pregabalin (LYRICA) 100 MG capsule Take 1 capsule (100 mg total) by mouth 2 (two) times daily. 02/08/16   Veryl Speak, FNP  traZODone (DESYREL) 50 MG tablet TAKE 0.5-1 TABLETS (25-50 MG TOTAL) BY MOUTH AT BEDTIME AS NEEDED FOR SLEEP. 02/11/16   Veryl Speak, FNP  warfarin (COUMADIN) 5 MG tablet TAKE AS DIRECTED BY  [facility]MADIN  CLINIC 12/29/15   Veryl Speak, FNP    Physical Exam: Vitals:   09/29/17 2130 09/29/17 2215 09/29/17 2230  09/29/17 2248  BP: 122/65 (!) 116/91 113/87 (!) 110/96  Pulse: (!) 107 (!) 104    Resp: (!) 23 14 14 20   Temp:      TempSrc:      SpO2: 99% 98%        Constitutional: NAD, calm, in apparent discomfort Eyes: PERTLA, lids and conjunctivae normal ENMT: Mucous membranes are moist. Posterior pharynx clear of any exudate or lesions. Otoscopic exam normal and painless.  Neck: normal, supple, no masses, no thyromegaly Respiratory: clear to auscultation bilaterally, no wheezing, no crackles. Normal respiratory effort.    Cardiovascular: S1 & S2 heard, regular rate and rhythm.  No significant JVD. Abdomen: No distension, no tenderness, no masses palpated. Bowel sounds normal.  Musculoskeletal: no clubbing / cyanosis. No joint deformity upper and lower extremities.   Skin: no significant rashes, lesions, ulcers. Warm, dry, well-perfused. Neurologic: Gross hearing deficit, CN 2-12 otherwise grossly intact. Sensation intact, patellar DTR normal. Strength 5/5 in all 4 limbs.  Psychiatric: Alert and oriented x 3. Calm, cooperative.     Labs on Admission: I have personally reviewed following labs and imaging studies  CBC: Recent Labs  Lab 09/29/17 2105  WBC 42.2*  NEUTROABS 35.8*  HGB 15.6*  HCT 45.2  MCV 86.3  PLT 297   Basic Metabolic Panel: Recent Labs  Lab 09/29/17 2105  NA 134*  K 5.9*  CL 102  CO2 18*  GLUCOSE 70  BUN 22*  CREATININE 2.50*  CALCIUM 8.3*   GFR: CrCl cannot be calculated (Unknown ideal weight.). Liver Function Tests: Recent Labs  Lab 09/29/17 2105  AST 143*  ALT 71*  ALKPHOS 65  BILITOT 0.8  PROT 7.8  ALBUMIN 4.3   No results for input(s): LIPASE, AMYLASE in the last 168 hours. No results for input(s): AMMONIA in the last 168 hours. Coagulation Profile: No results for input(s): INR, PROTIME in the last 168 hours. Cardiac Enzymes: No results for input(s): CKTOTAL, CKMB, CKMBINDEX, TROPONINI in the last 168 hours. BNP (last 3 results) No  results for input(s): PROBNP in the last 8760 hours. HbA1C: No results for input(s): HGBA1C in the last 72 hours. CBG: Recent Labs  Lab 09/29/17 2217  GLUCAP 64*   Lipid Profile: No results for input(s): CHOL, HDL, LDLCALC, TRIG, CHOLHDL, LDLDIRECT in the last 72 hours. Thyroid Function Tests: No results for input(s): TSH, T4TOTAL, FREET4, T3FREE, THYROIDAB in the last 72 hours. Anemia Panel: No results for input(s): VITAMINB12, FOLATE, FERRITIN, TIBC, IRON, RETICCTPCT in the last 72 hours. Urine analysis:    Component Value Date/Time   COLORURINE YELLOW 09/25/2012 0614   APPEARANCEUR CLEAR 09/25/2012 0614   LABSPEC 1.013 09/25/2012 0614   PHURINE 5.5 09/25/2012 0614   GLUCOSEU NEGATIVE 09/25/2012 0614   HGBUR NEGATIVE 09/25/2012 0614   BILIRUBINUR NEGATIVE 09/25/2012 0614   KETONESUR NEGATIVE 09/25/2012 0614   PROTEINUR NEGATIVE 09/25/2012 0614   UROBILINOGEN 0.2 09/25/2012  [id number]   NITRITE NEGATIVE 09/25/2012 0614   LEUKOCYTESUR NEGATIVE 09/25/2012 0614   Sepsis Labs: @LABRCNTIP (procalcitonin:4,lacticidven:4) )No results found for this or any previous visit (from the past 240 hour(s)).   Radiological Exams on Admission: Ct Head Wo Contrast  Result Date: 09/29/2017 CLINICAL DATA:  Altered mentation, heroin history. EXAM: CT HEAD WITHOUT CONTRAST TECHNIQUE: Contiguous axial images were obtained from the base of the skull through the vertex without intravenous contrast. COMPARISON:  03/04/2015 FINDINGS: Brain: Minimal small vessel ischemic disease of periventricular and subcortical white matter. No large vascular territory infarct, hemorrhage or midline shift. No intra-axial mass nor extra-axial fluid collections. No hydrocephalus. Midline fourth ventricle and basal cisterns without effacement. Vascular: No hyperdense vessels. Skull: No fracture. Slightly under pneumatized right mastoid. No mastoid effusions. Sinuses/Orbits: Small air-fluid level within the right maxillary sinus  that may reflect an acute on chronic sinusitis. Mild ethmoid sinus mucosal thickening. Unremarkable orbits. Other: None IMPRESSION: 1. Minimal small vessel ischemic disease of periventricular subcortical white matter. No acute intracranial appearing abnormality. 2. Acute on chronic right maxillary sinusitis. Electronically Signed   By: Tollie Ethavid  Kwon M.D.   On: 09/29/2017 23:26    EKG: Independently reviewed. Sinus tachycardia (rate 106).   Assessment/Plan  1. Unresponsiveness; suspected opiate overdose  - Pt was found unresponsive by family and EMS was called out, finding patient with pinpoint pupils and poor respiratory effort  - She was treated with Narcan in the field and reportedly became more alert and with more appropriate breathing pattern  - Head CT is negative and, aside from hearing difficulty (addressed below), no focal neurologic deficit identified  - Plan to continue eval & tx of SIRS, continue supportive care, and consider resuming her prn oxycodone in am if she remains stable   2. SIRS  - Pt presents after being found unresponsive with suspected opiate overdose; she is found to be febrile and tachycardic with marked leukocytosis and AKI  - Plan to obtain cultures, given another liter NS to complete 30 cc/kg bolus, check CXR, UA, lactate level, and PCT, start empiric broad-spectrum abx  - Abdominal exam in benign, no rash or wounds noted, and no meningismus   3. AKI, hyperkalemia  - SCr is 2.50 on admission, up from remote priors in 0.8 range  - No recent chem panels available, but the patient is hypovolemic, presents with SIRS, and suspect this is acute and likely prerenal  - She was treated in ED with 2 liters NS and was given a 3rd on admission to complete 30 cc/kg  - Plan to continue IVF hydration, renally-dose medications, avoid nephrotoxins where possible, repeat chem panel in am    4. Polysubstance abuse  - Pt has hx of alcohol and drug abuse  - UDS still pending, EtOH  level <5  - Monitor with CIWA and prn Ativan    5. Hx of PE  - No evidence for acute VTE  - Check INR and continue warfarin    6. Chronic pain  - No pain complaints on admission  - An opiate overdose was suspected and she responded to Narcan by EMS on the scene  - She is alert and with good respiratory effort on admission; continue monitoring and consider resuming her prn oxycodone if remains stable   7. Depression - Continue Cymbalta and trazodone    8. Hearing deficit - Pt has been complaining of intermittent hearing loss involving both ears without ear pain, headache, or any other focal neurologic deficits on exam  -  Head CT unremarkable  - No meningismus  - Otoscopic exam is normal and no pain elicited with manipulation of the ears    DVT prophylaxis: sq heparin  Code Status: Full  Family Communication: Family updated at bedside Disposition Plan: Admit to SDU Consults called: None Admission status: Inpatient    Briscoe Deutscherimothy S Abe Schools, MD [facility] Pager [phone]  If 7PM-7AM, please contact night-coverage [email or web address] Password [email or web address]  09/29/2017, 11:56 PM

## 2017-09-29 NOTE — ED Provider Notes (Signed)
[facility] EMERGENCY DEPARTMENT Provider Note   CSN: [id number] Arrival date & time: 09/29/17  2048     History   Chief Complaint Chief Complaint  Patient presents with  . Drug Overdose    HPI PATRICIANN Frank is a 51 y.o. female.  HPI Since after being found unresponsive by family members. The patient herself is somnolent, but awakens briefly, but does not provide accurate details of where she is, or what happened prior to ED arrival. Rather, she is repetitive, stating that she thinks she may be having a stroke, and states that she has apologetic for what may have occurred, and that she is in the pain. Level 5 caveat secondary to mental status change.   EMS providers note that on arrival the patient was receiving CPR by family members after she was found unresponsive. Patient was noted to have minimal respiratory effort, and was provided Narcan. Soon thereafter the patient was much more awake, had return to appropriate respiratory rhythm.  Past Medical History:  Diagnosis Date  . Anxiety   . Arthritis   . Chronic pain   . Depression   . Hypertension   . Lupus   . 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 Osteomyelitis (HCC)   . 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 Pleurisy   . Polysubstance abuse (HCC)   . Pulmonary embolism (HCC) 2008   seen at [facility]--right lung    Patient Active Problem List   Diagnosis Date Noted  . Insomnia 08/13/2015  . Alcohol use disorder severe. 04/03/2015  . Cocaine use disorder severe. 04/03/2015  . Stimulant use disorder. 04/03/2015  . Tobacco use disorder 04/01/2015  . 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 Drug overdose   . Intoxication   . Polysubstance abuse (HCC)   . Major depressive disorder, single episode, severe without psychotic features (HCC)   . Suicide attempt by multiple drug overdose (HCC) 03/05/2015  . Adjustment disorder with mixed anxiety and  depressed mood 03/05/2015  . MDD (major depressive disorder), single episode, severe (HCC) 03/05/2015  . Chronic pain 09/27/2012  . 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 Clotting disorder (HCC) 09/26/2012  . 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 Lupus   . Encounter for long-term (current) use of anticoagulants 04/19/2012  . Pulmonary embolism (HCC) 04/14/2012    Past Surgical History:  Procedure Laterality Date  . ANKLE ARTHRODESIS  10/23/2012  . ankle fusions     bilat fusions, multiple  . multiple orthopeadic surgeries     both ankles    OB History    No data available       Home Medications    Prior to Admission medications   Medication Sig Start Date End Date Taking? Authorizing Provider  DULoxetine (CYMBALTA) 60 MG capsule TAKE 1 CAPSULE (60 MG TOTAL) BY MOUTH DAILY. 01/06/17   Veryl Speak, FNP  Oxycodone HCl 10 MG TABS Take 1 tablet (10 mg total) by mouth 3 (three) times daily as needed. 02/08/16   Veryl Speak, FNP  pregabalin (LYRICA) 100 MG capsule Take 1 capsule (100 mg total) by mouth 2 (two) times daily. 02/08/16   Veryl Speak, FNP  traZODone (DESYREL) 50 MG tablet TAKE 0.5-1 TABLETS (25-50 MG TOTAL) BY MOUTH AT BEDTIME AS NEEDED FOR SLEEP. 02/11/16   Veryl Speak, FNP  warfarin (COUMADIN) 5 MG tablet TAKE AS DIRECTED BY  COUMADIN  CLINIC 12/29/15   Jeanine Luz  D, FNP    Family History Family History  Problem Relation Age of Onset  . Healthy Mother   . Hypertension Father   . Diabetes Father   . Mental illness Father   . Arthritis Maternal Grandmother   . Healthy Maternal Grandfather   . Healthy Paternal Grandmother   . Healthy Paternal Grandfather   . Clotting disorder Paternal Aunt   . Cancer Maternal Uncle     Social History Social History   Tobacco Use  . Smoking status: Current Every Day Smoker    Packs/day: 0.50    Years: 35.00    Pack years: 17.50    Types: Cigarettes  . Smokeless tobacco: Never Used    Substance Use Topics  . Alcohol use: Yes    Alcohol/week: 2.4 oz    Types: 4 Cans of beer per week    Comment: ocassionally  . Drug use: Yes    Types: Oxycodone, Cocaine, Marijuana     Allergies   Patient has no known allergies.   Review of Systems Review of Systems  Unable to perform ROS: Mental status change     Physical Exam Updated Vital Signs BP 122/65   Pulse (!) 107   Temp (!) 100.6 F (38.1 C) (Oral)   Resp (!) 23   SpO2 99%   Physical Exam  Constitutional: She is oriented to person, place, and time. She appears well-developed and well-nourished. No distress.  Obese female somnolent, but awakens easily with minimal stimuli  HENT:  Head: Normocephalic and atraumatic.  Eyes: Conjunctivae and EOM are normal.    Cardiovascular: Normal rate and regular rhythm.  Pulmonary/Chest: Effort normal and breath sounds normal. No stridor. No respiratory distress.  Abdominal: She exhibits no distension.  Musculoskeletal: She exhibits no edema.  Neurological: She is alert and oriented to person, place, and time. No cranial nerve deficit.  Skin: Skin is warm and dry.  Psychiatric: Her mood appears anxious. Her affect is labile. Her speech is delayed and tangential. Cognition and memory are impaired.  Nursing note and vitals reviewed.    ED Treatments / Results  Labs (all labs ordered are listed, but only abnormal results are displayed) Labs Reviewed  COMPREHENSIVE METABOLIC PANEL - Abnormal; Notable for the following components:      Result Value   Sodium 134 (*)    Potassium 5.9 (*)    CO2 18 (*)    BUN 22 (*)    Creatinine, Ser 2.50 (*)    Calcium 8.3 (*)    AST 143 (*)    ALT 71 (*)    GFR calc non Af Amer 21 (*)    GFR calc Af Amer 25 (*)    All other components within normal limits  CBC WITH DIFFERENTIAL/PLATELET - Abnormal; Notable for the following components:   WBC 42.2 (*)    RBC 5.24 (*)    Hemoglobin 15.6 (*)    RDW 16.1 (*)    Neutro Abs 35.8  (*)    Monocytes Absolute 3.4 (*)    All other components within normal limits  CBG MONITORING, ED - Abnormal; Notable for the following components:   Glucose-Capillary 64 (*)    All other components within normal limits  SALICYLATE LEVEL  ACETAMINOPHEN LEVEL  ETHANOL  RAPID URINE DRUG SCREEN, HOSP PERFORMED  I-STAT BETA HCG BLOOD, ED ([facility], [facility], [facility] ONLY)    EKG  EKG Interpretation  Date/Time:  Friday September 29 2017 20:58:43 EST Ventricular Rate:  106 PR Interval:  QRS Duration: 78 QT Interval:  345 QTC Calculation: 459 R Axis:   61 Text Interpretation:  Sinus tachycardia Low voltage, precordial leads Abnormal R-wave progression, early transition Abnormal ekg Confirmed by Gerhard MunchLockwood, Isiac Breighner [phone] on 09/29/2017 11:23:53 PM       Radiology Ct Head Wo Contrast  Result Date: 09/29/2017 CLINICAL DATA:  Altered mentation, heroin history. EXAM: CT HEAD WITHOUT CONTRAST TECHNIQUE: Contiguous axial images were obtained from the base of the skull through the vertex without intravenous contrast. COMPARISON:  03/04/2015 FINDINGS: Brain: Minimal small vessel ischemic disease of periventricular and subcortical white matter. No large vascular territory infarct, hemorrhage or midline shift. No intra-axial mass nor extra-axial fluid collections. No hydrocephalus. Midline fourth ventricle and basal cisterns without effacement. Vascular: No hyperdense vessels. Skull: No fracture. Slightly under pneumatized right mastoid. No mastoid effusions. Sinuses/Orbits: Small air-fluid level within the right maxillary sinus that may reflect an acute on chronic sinusitis. Mild ethmoid sinus mucosal thickening. Unremarkable orbits. Other: None IMPRESSION: 1. Minimal small vessel ischemic disease of periventricular subcortical white matter. No acute intracranial appearing abnormality. 2. Acute on chronic right maxillary sinusitis. Electronically Signed   By: Tollie Ethavid  Kwon M.D.   On: 09/29/2017 23:26     Procedures Procedures (including critical care time)  Medications Ordered in ED Medications  sodium chloride 0.9 % bolus 1,000 mL (1,000 mLs Intravenous New Bag/Given 09/29/17 2231)  sodium chloride 0.9 % bolus 1,000 mL (1,000 mLs Intravenous New Bag/Given 09/29/17 2249)     Initial Impression / Assessment and Plan / ED Course  I have reviewed the triage vital signs and the nursing notes.  Pertinent labs & imaging results that were available during my care of the patient were reviewed by me and considered in my medical decision making (see chart for details).  Initial labs notable for hyperkalemia, elevated creatinine. Patient has received fluids, empirically, and will continue to do so.  Update: Patient much more awake and alert.  She now complains of intermittent hearing changes.  Update: Family members not presumed overdose, being found unresponsive, improved after the performed some version of CPR. Also corroborate that the patient had complained of hearing loss earlier.  This 51 year old female with history of chronic pain presents after a presumed overdose. Here the patient continued to receive fluid resuscitation, continuous monitoring after she was found to have acute kidney injury, hyperkalemia. She improved substantially, was awake and alert.  On however, she did continue to complain of some hearing loss. Given the patient's new renal injury, hyperkalemia, concern for overdose, the patient was admitted for further evaluation and management.  Final Clinical Impressions(s) / ED Diagnoses  Overdose Hyperkalemia Acute kidney injury   Gerhard MunchLockwood, Oluwatobiloba Martin, MD 09/29/17 2334

## 2017-09-30 ENCOUNTER — Encounter (HOSPITAL_COMMUNITY): Payer: Self-pay

## 2017-09-30 DIAGNOSIS — I2782 Chronic pulmonary embolism: Secondary | ICD-10-CM

## 2017-09-30 LAB — URINALYSIS, ROUTINE W REFLEX MICROSCOPIC
Bacteria, UA: NONE SEEN
Bilirubin Urine: NEGATIVE
Glucose, UA: NEGATIVE mg/dL
Ketones, ur: 5 mg/dL — AB
Leukocytes, UA: NEGATIVE
Nitrite: NEGATIVE
Protein, ur: 30 mg/dL — AB
Specific Gravity, Urine: 1.01 (ref 1.005–1.030)
pH: 5 (ref 5.0–8.0)

## 2017-09-30 LAB — BASIC METABOLIC PANEL
Anion gap: 6 (ref 5–15)
BUN: 15 mg/dL (ref 6–20)
CO2: 25 mmol/L (ref 22–32)
Calcium: 8 mg/dL — ABNORMAL LOW (ref 8.9–10.3)
Chloride: 108 mmol/L (ref 101–111)
Creatinine, Ser: 1.28 mg/dL — ABNORMAL HIGH (ref 0.44–1.00)
GFR calc Af Amer: 56 mL/min — ABNORMAL LOW (ref >60)
GFR calc non Af Amer: 48 mL/min — ABNORMAL LOW (ref >60)
Glucose, Bld: 116 mg/dL — ABNORMAL HIGH (ref 65–99)
Potassium: 4.6 mmol/L (ref 3.5–5.1)
Sodium: 139 mmol/L (ref 135–145)

## 2017-09-30 LAB — PROTIME-INR
INR: 3.51
INR: 4.24
Prothrombin Time: 34.9 seconds — ABNORMAL HIGH (ref 11.4–15.2)
Prothrombin Time: 40.5 seconds — ABNORMAL HIGH (ref 11.4–15.2)

## 2017-09-30 LAB — POTASSIUM
Potassium: 5 mmol/L (ref 3.5–5.1)
Potassium: 4.3 mmol/L (ref 3.5–5.1)
Potassium: 4.5 mmol/L (ref 3.5–5.1)

## 2017-09-30 LAB — CBC WITH DIFFERENTIAL/PLATELET
Basophils Absolute: 0 10*3/uL (ref 0.0–0.1)
Basophils Relative: 0 %
Eosinophils Absolute: 0 10*3/uL (ref 0.0–0.7)
Eosinophils Relative: 0 %
HCT: 40.6 % (ref 36.0–46.0)
Hemoglobin: 13.9 g/dL (ref 12.0–15.0)
Lymphocytes Relative: 10 %
Lymphs Abs: 3.3 10*3/uL (ref 0.7–4.0)
MCH: 29 pg (ref 26.0–34.0)
MCHC: 34.2 g/dL (ref 30.0–36.0)
MCV: 84.8 fL (ref 78.0–100.0)
Monocytes Absolute: 2.3 10*3/uL — ABNORMAL HIGH (ref 0.1–1.0)
Monocytes Relative: 7 %
Neutro Abs: 27.5 10*3/uL — ABNORMAL HIGH (ref 1.7–7.7)
Neutrophils Relative %: 83 %
Platelets: 257 10*3/uL (ref 150–400)
RBC: 4.79 MIL/uL (ref 3.87–5.11)
RDW: 16.2 % — ABNORMAL HIGH (ref 11.5–15.5)
WBC: 33.1 10*3/uL — ABNORMAL HIGH (ref 4.0–10.5)

## 2017-09-30 LAB — RAPID URINE DRUG SCREEN, HOSP PERFORMED
Amphetamines: NOT DETECTED
Barbiturates: NOT DETECTED
Benzodiazepines: NOT DETECTED
Cocaine: NOT DETECTED
Opiates: POSITIVE — AB
Tetrahydrocannabinol: NOT DETECTED

## 2017-09-30 LAB — CK: Total CK: 9459 U/L — ABNORMAL HIGH (ref 38–234)

## 2017-09-30 LAB — APTT: aPTT: 35 seconds (ref 24–36)

## 2017-09-30 LAB — LACTIC ACID, PLASMA
Lactic Acid, Venous: 2 mmol/L (ref 0.5–1.9)
Lactic Acid, Venous: 1.2 mmol/L (ref 0.5–1.9)

## 2017-09-30 LAB — CREATININE, URINE, RANDOM: Creatinine, Urine: 66.83 mg/dL

## 2017-09-30 LAB — HIV ANTIBODY (ROUTINE TESTING W REFLEX): HIV Screen 4th Generation wRfx: NONREACTIVE

## 2017-09-30 LAB — PROCALCITONIN: Procalcitonin: 1.31 ng/mL

## 2017-09-30 LAB — MRSA PCR SCREENING: MRSA by PCR: NEGATIVE

## 2017-09-30 LAB — SODIUM, URINE, RANDOM: Sodium, Ur: 33 mmol/L

## 2017-09-30 MED ORDER — SODIUM CHLORIDE 0.9 % IV SOLN
INTRAVENOUS | Status: DC
  Administered 2017-09-30 – 2017-10-01 (×2): via INTRAVENOUS

## 2017-09-30 MED ORDER — SODIUM CHLORIDE 0.9 % IV SOLN: 1250.0000 mg | INTRAVENOUS | Status: DC

## 2017-09-30 MED ORDER — PIPERACILLIN-TAZOBACTAM 3.375 G IVPB
3.3750 g | Freq: Three times a day (TID) | INTRAVENOUS | Status: DC
  Administered 2017-09-30: 3.375 g via INTRAVENOUS
  Filled 2017-09-30 (×2): qty 50

## 2017-09-30 MED ORDER — LORAZEPAM 2 MG/ML IJ SOLN: 2.0000 mg | INTRAMUSCULAR | Status: DC | PRN

## 2017-09-30 MED ORDER — INFLUENZA VAC SPLIT QUAD 0.5 ML IM SUSY
0.5000 mL | PREFILLED_SYRINGE | INTRAMUSCULAR | Status: AC
  Administered 2017-10-01: 0.5 mL via INTRAMUSCULAR
  Filled 2017-09-30: qty 0.5

## 2017-09-30 NOTE — Progress Notes (Signed)
ANTICOAGULATION CONSULT NOTE - Initial Consult  Pharmacy Consult for Warfarin  Indication: Hx PE  No Known Allergies   Vital Signs: Temp: 100.6 F (38.1 C) (11/09 2104) Temp Source: Oral (11/09 2104) BP: 110/96 (11/09 2248) Pulse Rate: 104 (11/09 2215)  Labs: Recent Labs    09/29/17 2105  HGB 15.6*  HCT 45.2  PLT 297  CREATININE 2.50*    CrCl cannot be calculated (Unknown ideal weight.).   Medical History: Past Medical History:  Diagnosis Date  . Anxiety   . Arthritis   . Chronic pain   . Depression   . Hypertension   . Lupus   . 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 Osteomyelitis (HCC)   . 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 Pleurisy   . Polysubstance abuse (HCC)   . Pulmonary embolism (HCC) 2008   seen at [facility]--right lung    Assessment: 51 y/o F here with suspected drug overdose, on warfarin PTA for hx PE, no INR yet  Goal of Therapy:  INR 2-3 Monitor platelets by anticoagulation protocol: Yes   Plan:  INR with AM labs to assess dosing needs  Abran DukeLedford, Carie Kapuscinski 09/30/2017,12:56 AM

## 2017-09-30 NOTE — Progress Notes (Signed)
[place] TEAM 1 - Stepdown/ICU TEAM  Crystal ButtsJennifer L Frank  WUJ:[id number] DOB: [birth year] DOA: 09/29/2017 PCP: Veryl Speakalone, Gregory D, FNP    Brief Narrative:  51 y.o. female with a history of depression, anxiety, chronic pain, pulmonary embolism on warfarin, and polysubstance abuse who presented to the ED after she was found unresponsive by family.  Patient had reportedly been complaining of intermittent hearing difficulty, and was later found lying on the floor, unresponsive.  Family called 911 and began trying to revive the patient.  On EMS arrival, the patient was poorly responsive, had pinpoint pupils, and poor respiratory effort.  She was treated with Narcan, became more responsive and began breathing more regularly.  In the ED she was found to be febrile to 38.1 and tachycardic.  Noncontrast head CT was negative for acute intracranial abnormality.  Ethanol, salicylate and acetaminophen levels were negative.  Creatinine was 2.50, up from 0.8 range on remote priors.    Significant Events: 11/9 admit   Subjective: The patient is resting comfortably in bed.  She seems mildly agitated at present.  She denies any recollection of taking extra narcotic doses at home and in fact states that she had run out of her medications and was waiting on a new prescription to be refilled at the pharmacy.  She denies current shortness of breath nausea vomiting or abdominal pain.  She denies use of any illicit drugs.  Assessment & Plan:  Suspected opiate overdose -unresponsive UDS + for opiates - corrected w/ narcan - pt denies use of narcotics on day of admit - appears stable at present in this regard   SIRS Due to above - cont to hydrate - no evidence of infection/sepsis therefore will d/c abx  Rhabdomyolysis  Was likely down for a signif period of time - cont to hydrate until CK improves significantly   Acute kidney injury Creatinine 2.5 on admission with no prior history of kidney disease - much improved w/  volume expansion - follow to normalization   Subacute B hearing difficulty Otoscopic exam at admission unrevealing -CT head unremarkable -no meningismus -no pain in the ears -no other focal neurologic deficits - no hearing difficulty whatsoever at time of exam today   Hyperkalemia Resolved w/ volume expansion   Polysubstance abuse Pt vehemently denies   History of PE Continue warfarin as per outpatient  DVT prophylaxis: warfarin  Code Status: FULL CODE Family Communication: no family present at time of exam  Disposition Plan: transfer to tele bed   Consultants:  none  Antimicrobials:  Zosyn 11/9 > Vanc 11/9   Objective: Blood pressure 117/70, pulse 87, temperature 98.5 F (36.9 C), temperature source Oral, resp. rate 17, height 5\' 11"  (1.803 m), weight 96 kg (211 lb 10.3 oz), SpO2 93 %.  Intake/Output Summary (Last 24 hours) at 09/30/2017 1155 Last data filed at 09/30/2017 [id number] Gross per 24 hour  Intake 2557 ml  Output 2475 ml  Net 82 ml   Filed Weights   09/30/17 0256  Weight: 96 kg (211 lb 10.3 oz)    Examination: General: No acute respiratory distress - mildly agitated  Lungs: Clear to auscultation bilaterally without wheezes or crackles Cardiovascular: Regular rate and rhythm without murmur gallop or rub normal S1 and S2 Abdomen: Nontender, nondistended, soft, bowel sounds positive, no rebound, no ascites, no appreciable mass Extremities: No significant cyanosis, clubbing, or edema bilateral lower extremities  CBC: Recent Labs  Lab 09/29/17 2105 09/30/17 0314  WBC 42.2* 33.1*  NEUTROABS 35.8* 27.5*  HGB 15.6* 13.9  HCT 45.2 40.6  MCV 86.3 84.8  PLT 297 257   Basic Metabolic Panel: Recent Labs  Lab 09/29/17 2105 09/29/17 2349 09/30/17 0314 09/30/17 0720  NA 134*  --  139  --   K 5.9* 5.0 4.6 4.3  CL 102  --  108  --   CO2 18*  --  25  --   GLUCOSE 70  --  116*  --   BUN 22*  --  15  --   CREATININE 2.50*  --  1.28*  --   CALCIUM 8.3*  --   8.0*  --    GFR: Estimated Creatinine Clearance: 67.9 mL/min (A) (by C-G formula based on SCr of 1.28 mg/dL (H)).  Liver Function Tests: Recent Labs  Lab 09/29/17 2105  AST 143*  ALT 71*  ALKPHOS 65  BILITOT 0.8  PROT 7.8  ALBUMIN 4.3    Coagulation Profile: Recent Labs  Lab 09/29/17 2349 09/30/17 0314  INR 3.51 4.24*    Cardiac Enzymes: Recent Labs  Lab 09/29/17 2349  CKTOTAL 9,459*    HbA1C: No results found for: HGBA1C  CBG: Recent Labs  Lab 09/29/17 2217  GLUCAP 64*    Recent Results (from the past 240 hour(s))  MRSA PCR Screening     Status: None   Collection Time: 09/30/17  2:07 AM  Result Value Ref Range Status   MRSA by PCR NEGATIVE NEGATIVE Final    Comment:        The GeneXpert MRSA Assay (FDA approved for NASAL specimens only), is one component of a comprehensive MRSA colonization surveillance program. It is not intended to diagnose MRSA infection nor to guide or monitor treatment for MRSA infections.      Scheduled Meds: . DULoxetine  30 mg Oral Daily  . folic acid  1 mg Oral Daily  . [START ON 10/01/2017] Influenza vac split quadrivalent PF  0.5 mL Intramuscular Tomorrow-1000  . multivitamin with minerals  1 tablet Oral Daily  . pregabalin  100 mg Oral BID  . sodium chloride flush  3 mL Intravenous Q12H  . thiamine  100 mg Oral Daily     LOS: 1 day   Lonia BloodJeffrey T. Eesha Schmaltz, MD [facility] Office  [phone] Pager - Text Page per Loretha StaplerAmion as per below:  On-Call/Text Page:      [email or web address]      password [email or web address]  If 7PM-7AM, please contact night-coverage [email or web address] Password [email or web address] 09/30/2017, 11:55 AM

## 2017-09-30 NOTE — Progress Notes (Signed)
ANTICOAGULATION CONSULT NOTE - Follow up Pharmacy Consult for Warfarin  Indication: Hx PE  No Known Allergies   Vital Signs: Temp: 98.2 F (36.8 C) (11/10 1210) Temp Source: Oral (11/10 1210) BP: 119/69 (11/10 1210) Pulse Rate: 93 (11/10 1210)  Labs: Recent Labs    09/29/17 2105 09/29/17 2349 09/30/17 0314  HGB 15.6*  --  13.9  HCT 45.2  --  40.6  PLT 297  --  257  APTT  --  35  --   LABPROT  --  34.9* 40.5*  INR  --  3.51 4.24*  CREATININE 2.50*  --  1.28*  CKTOTAL  --  9,459*  --     Estimated Creatinine Clearance: 67.9 mL/min (A) (by C-G formula based on SCr of 1.28 mg/dL (H)).   Medical History: Past Medical History:  Diagnosis Date  . Anxiety   . Arthritis   . Bipolar disorder (HCC)   . Chronic pain   . Depression   . Diabetes mellitus without complication (HCC)   . Hypertension   . Lupus   . Neuromuscular disorder (HCC)   . 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 Osteomyelitis (HCC)   . 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 Pleurisy   . Polysubstance abuse (HCC)   . Pulmonary embolism (HCC) 2008   seen at [facility]--right lung  . [facility]ures [facility](HCC)     Assessment: 51 y/o F here with suspected drug overdose, on warfarin PTA for hx PE.  INR 3.51 on admit 11/9>>INR = 4.24 today.  CBC within normal limits. No bleeding noted.  SUPRAtherapeutic INR thus no coumadin needed today.    Goal of Therapy:  INR 2-3 Monitor platelets by anticoagulation protocol: Yes   Plan:  Hold coumadin today Daily INR   Thank you for allowing pharmacy to be part of this patients care team.  Noah Delaineuth Jakya Dovidio, RPh Clinical Pharmacist 10a-330p [phone] Main pharmacy [phone] 09/30/2017,12:47 PM

## 2017-09-30 NOTE — Progress Notes (Signed)
Pharmacy Antibiotic Note  Thurmond ButtsJennifer L Cislo is a 51 y.o. female admitted on 09/29/2017 with sepsis.  Pharmacy has been consulted for Vancomycin/Zosyn dosing. Possible drug overdose. WBC is markedly elevated. Pt noted to be in acute renal failure. Normalized CrCl ~30-35.   Plan: Vancomycin 1250 mg IV q24h Zosyn 3.375G IV q8h to be infused over 4 hours Trend WBC, temp, renal function  F/U infectious work-up Drug levels as indicated  Temp (24hrs), Avg:100.6 F (38.1 C), Min:100.6 F (38.1 C), Max:100.6 F (38.1 C)  Recent Labs  Lab 09/29/17 2105  WBC 42.2*  CREATININE 2.50*    CrCl cannot be calculated (Unknown ideal weight.).    No Known Allergies   Abran DukeLedford, Gailen Venne 09/30/2017 12:50 AM

## 2017-09-30 NOTE — Progress Notes (Signed)
CRITICAL VALUE ALERT  Critical Value:  INR 4.24 Date & Time Notied: 09/30/17  Provider Notified: DR.Opyd  Orders Received/Actions taken: no orders

## 2017-10-01 DIAGNOSIS — Z23 Encounter for immunization: Secondary | ICD-10-CM | POA: Diagnosis not present

## 2017-10-01 DIAGNOSIS — T50902D Poisoning by unspecified drugs, medicaments and biological substances, intentional self-harm, subsequent encounter: Secondary | ICD-10-CM

## 2017-10-01 LAB — CBC
HCT: 37.1 % (ref 36.0–46.0)
Hemoglobin: 12.4 g/dL (ref 12.0–15.0)
MCH: 28.8 pg (ref 26.0–34.0)
MCHC: 33.4 g/dL (ref 30.0–36.0)
MCV: 86.1 fL (ref 78.0–100.0)
Platelets: 188 10*3/uL (ref 150–400)
RBC: 4.31 MIL/uL (ref 3.87–5.11)
RDW: 16.8 % — ABNORMAL HIGH (ref 11.5–15.5)
WBC: 16 10*3/uL — ABNORMAL HIGH (ref 4.0–10.5)

## 2017-10-01 LAB — COMPREHENSIVE METABOLIC PANEL
ALT: 92 U/L — ABNORMAL HIGH (ref 14–54)
AST: 126 U/L — ABNORMAL HIGH (ref 15–41)
Albumin: 3.2 g/dL — ABNORMAL LOW (ref 3.5–5.0)
Alkaline Phosphatase: 42 U/L (ref 38–126)
Anion gap: 6 (ref 5–15)
BUN: 9 mg/dL (ref 6–20)
CO2: 27 mmol/L (ref 22–32)
Calcium: 8.5 mg/dL — ABNORMAL LOW (ref 8.9–10.3)
Chloride: 110 mmol/L (ref 101–111)
Creatinine, Ser: 0.68 mg/dL (ref 0.44–1.00)
GFR calc Af Amer: >60 mL/min (ref >60)
GFR calc non Af Amer: >60 mL/min (ref >60)
Glucose, Bld: 112 mg/dL — ABNORMAL HIGH (ref 65–99)
Potassium: 4.1 mmol/L (ref 3.5–5.1)
Sodium: 143 mmol/L (ref 135–145)
Total Bilirubin: 1 mg/dL (ref 0.3–1.2)
Total Protein: 5.8 g/dL — ABNORMAL LOW (ref 6.5–8.1)

## 2017-10-01 LAB — PROTIME-INR
INR: 1.93
Prothrombin Time: 21.9 seconds — ABNORMAL HIGH (ref 11.4–15.2)

## 2017-10-01 LAB — VITAMIN B12: Vitamin B-12: 591 pg/mL (ref 180–914)

## 2017-10-01 LAB — TSH: TSH: 0.912 u[IU]/mL (ref 0.350–4.500)

## 2017-10-01 LAB — CK: Total CK: 3284 U/L — ABNORMAL HIGH (ref 38–234)

## 2017-10-01 LAB — FOLATE: Folate: 16.5 ng/mL (ref >5.9)

## 2017-10-01 MED ORDER — WARFARIN SODIUM 5 MG PO TABS: 5.0000 mg | ORAL_TABLET | Freq: Once | ORAL | Status: DC

## 2017-10-01 MED ORDER — WARFARIN - PHARMACIST DOSING INPATIENT: Freq: Every day | Status: DC

## 2017-10-01 NOTE — Progress Notes (Signed)
Discharge order place by provider, patient is calling family for a ride home.

## 2017-10-01 NOTE — Care Management Note (Signed)
Case Management Note  Patient Details  Name: Thurmond ButtsJennifer L Dobie MRN: [id number] Date of Birth: 1966/01/19  Subjective/Objective:   Noted CM consult for Substance Abuse Hx.   CSW has consult will sign off.                Action/Plan:CM will sign off for now but will be available should additional discharge needs arise or disposition change.    Expected Discharge Date:  10/02/17               Expected Discharge Plan:     In-House Referral:  Clinical Social Work  Discharge planning Services  CM Consult  Post Acute Care Choice:    Choice offered to:     DME Arranged:    DME Agency:     HH Arranged:    HH Agency:     Status of Service:  Completed, signed off  If discussed at [employer] Meetings, dates discussed:    Additional Comments:  Yvone NeuCrutchfield, Myrle Dues M, RN 10/01/2017, 8:54 AM

## 2017-10-01 NOTE — Progress Notes (Addendum)
Crystal ButtsJennifer L Frank to be D/C'd Home per MD order.  Discussed with the patient and all questions fully answered.  VSS, Skin clean, dry and intact without evidence of skin break down, no evidence of skin tears noted. IV catheter discontinued intact. Site without signs and symptoms of complications. Dressing and pressure applied.  An After Visit Summary was printed and given to the patient. Patient received prescription.  D/c education completed with patient/family including follow up instructions, medication list, d/c activities limitations if indicated, with other d/c instructions as indicated by MD - patient able to verbalize understanding, all questions fully answered.   Patient instructed to return to ED, call 911, or call MD for any changes in condition.   EDIT: sister locked keys in car. Waiting to get them out.  Crystal HarrisonSamantha K Raea Frank 10/01/2017 5:52 PM

## 2017-10-01 NOTE — Discharge Summary (Signed)
Physician Discharge Summary  Crystal Frank MWN:[id number] DOB: 06/29/1966 DOA: 09/29/2017  PCP: Veryl Speak, FNP  Admit date: 09/29/2017 Discharge date: 10/01/2017  Time spent: > 35 minutes  Recommendations for Outpatient Follow-up:  Would avoid opiods in this patient due to history and this admission   Discharge Diagnoses:  Principal Problem:   Drug overdose Active Problems:   Pulmonary embolism (HCC)   Chronic pain   Polysubstance abuse (HCC)   Alcohol use disorder severe.   Depression   SIRS (systemic inflammatory response syndrome) (HCC)   Hearing deficit   AKI (acute kidney injury) (HCC)   Hyperkalemia   Discharge Condition: stable  Diet recommendation: regular diet  Filed Weights   09/30/17 0256 09/30/17 1555 10/01/17 0717  Weight: 96 kg (211 lb 10.3 oz) 98.8 kg (217 lb 14.4 oz) 99.1 kg (218 lb 8 oz)    History of present illness:  51 y.o. female with medical history significant for depression, anxiety, chronic pain, history of pulmonary embolism on warfarin, and polysubstance abuse, now presenting to the emergency department after she was found unresponsive by family.  Hospital Course:  Unresponsiveness - Most likely due to opiod use given reports of pinpoint pupils and improvement in condition after patient receiving Naloxone. - Pt reports that she does not remember and that she was not trying to intentionally hurt herself.  For known medical conditions prior to admission will continue home medication regimen. We'll discontinue opioid  Procedures:  None  Consultations:  None  Discharge Exam: Vitals:   09/30/17 2133 10/01/17 0548  BP: 138/74 (!) 126/98  Pulse: 80 78  Resp: 16 16  Temp: 98 F (36.7 C) 98 F (36.7 C)  SpO2: 100% 98%    General: Pt in nad, alert and awake Cardiovascular: rrr, no rubs Respiratory: no increased wob, no wheezes, equal chest rise.  Discharge Instructions   Discharge Instructions    Diet - low sodium heart  healthy   Complete by:  As directed    Discharge instructions   Complete by:  As directed    Please abstain from taking illicit drugs or opiods   Increase activity slowly   Complete by:  As directed      Current Discharge Medication List    CONTINUE these medications which have NOT CHANGED   Details  DULoxetine (CYMBALTA) 60 MG capsule TAKE 1 CAPSULE (60 MG TOTAL) BY MOUTH DAILY. Qty: 30 capsule, Refills: 0    metoprolol tartrate (LOPRESSOR) 25 MG tablet Take 25 mg daily by mouth. Refills: 0    Multiple Vitamin (MULTIVITAMIN WITH MINERALS) TABS tablet Take 1 tablet daily by mouth.    pregabalin (LYRICA) 100 MG capsule Take 1 capsule (100 mg total) by mouth 2 (two) times daily. Qty: 60 capsule, Refills: 0   Associated Diagnoses: Chronic pain    rosuvastatin (CRESTOR) 10 MG tablet Take 10 mg at bedtime by mouth. Refills: 3    traZODone (DESYREL) 50 MG tablet TAKE 0.5-1 TABLETS (25-50 MG TOTAL) BY MOUTH AT BEDTIME AS NEEDED FOR SLEEP. Qty: 60 tablet, Refills: 0    vitamin B-12 (CYANOCOBALAMIN) 1000 MCG tablet Take 1,000 mcg daily by mouth.    warfarin (COUMADIN) 5 MG tablet TAKE AS DIRECTED BY  COUMADIN  CLINIC Qty: 80 tablet, Refills: 3      STOP taking these medications     diphenhydrAMINE (BENADRYL) 25 mg capsule      Oxycodone HCl 10 MG TABS        No Known Allergies  The results of significant diagnostics from this hospitalization (including imaging, microbiology, ancillary and laboratory) are listed below for reference.    Significant Diagnostic Studies: Ct Head Wo Contrast  Result Date: 09/29/2017 CLINICAL DATA:  Altered mentation, heroin history. EXAM: CT HEAD WITHOUT CONTRAST TECHNIQUE: Contiguous axial images were obtained from the base of the skull through the vertex without intravenous contrast. COMPARISON:  03/04/2015 FINDINGS: Brain: Minimal small vessel ischemic disease of periventricular and subcortical white matter. No large vascular territory  infarct, hemorrhage or midline shift. No intra-axial mass nor extra-axial fluid collections. No hydrocephalus. Midline fourth ventricle and basal cisterns without effacement. Vascular: No hyperdense vessels. Skull: No fracture. Slightly under pneumatized right mastoid. No mastoid effusions. Sinuses/Orbits: Small air-fluid level within the right maxillary sinus that may reflect an acute on chronic sinusitis. Mild ethmoid sinus mucosal thickening. Unremarkable orbits. Other: None IMPRESSION: 1. Minimal small vessel ischemic disease of periventricular subcortical white matter. No acute intracranial appearing abnormality. 2. Acute on chronic right maxillary sinusitis. Electronically Signed   By: Tollie Ethavid  Kwon M.D.   On: 09/29/2017 23:26   Dg Chest Port 1 View  Result Date: 09/30/2017 CLINICAL DATA:  Acute onset of systemic inflammatory response syndrome. Assess for source of infection. EXAM: PORTABLE CHEST 1 VIEW COMPARISON:  Chest radiograph performed 08/23/2013 FINDINGS: The lungs are mildly hypoexpanded. Mild right perihilar opacity could reflect pneumonia, given clinical concern. There is no evidence of pleural effusion or pneumothorax. The cardiomediastinal silhouette is within normal limits. No acute osseous abnormalities are seen. IMPRESSION: Lungs mildly hypoexpanded. Mild right perihilar opacity could reflect pneumonia, given clinical concern. Electronically Signed   By: Roanna RaiderJeffery  Chang M.D.   On: 09/30/2017 00:09    Microbiology: Recent Results (from the past 240 hour(s))  Culture, blood (x 2)     Status: None (Preliminary result)   Collection Time: 09/30/17 12:15 AM  Result Value Ref Range Status   Specimen Description BLOOD RIGHT HAND  Final   Special Requests   Final    BOTTLES DRAWN AEROBIC ONLY Blood Culture adequate volume   Culture NO GROWTH 1 DAY  Final   Report Status PENDING  Incomplete  Culture, blood (x 2)     Status: None (Preliminary result)   Collection Time: 09/30/17 12:35 AM   Result Value Ref Range Status   Specimen Description BLOOD LEFT HAND  Final   Special Requests IN PEDIATRIC BOTTLE Blood Culture adequate volume  Final   Culture NO GROWTH 1 DAY  Final   Report Status PENDING  Incomplete  MRSA PCR Screening     Status: None   Collection Time: 09/30/17  2:07 AM  Result Value Ref Range Status   MRSA by PCR NEGATIVE NEGATIVE Final    Comment:        The GeneXpert MRSA Assay (FDA approved for NASAL specimens only), is one component of a comprehensive MRSA colonization surveillance program. It is not intended to diagnose MRSA infection nor to guide or monitor treatment for MRSA infections.      Labs: Basic Metabolic Panel: Recent Labs  Lab 09/29/17 2105 09/29/17 2349 09/30/17 0314 09/30/17 0720 09/30/17 1120 10/01/17 0459  NA 134*  --  139  --   --  143  K 5.9* 5.0 4.6 4.3 4.5 4.1  CL 102  --  108  --   --  110  CO2 18*  --  25  --   --  27  GLUCOSE 70  --  116*  --   --  112*  BUN 22*  --  15  --   --  9  CREATININE 2.50*  --  1.28*  --   --  0.68  CALCIUM 8.3*  --  8.0*  --   --  8.5*   Liver Function Tests: Recent Labs  Lab 09/29/17 2105 10/01/17 0459  AST 143* 126*  ALT 71* 92*  ALKPHOS 65 42  BILITOT 0.8 1.0  PROT 7.8 5.8*  ALBUMIN 4.3 3.2*   No results for input(s): LIPASE, AMYLASE in the last 168 hours. No results for input(s): AMMONIA in the last 168 hours. CBC: Recent Labs  Lab 09/29/17 2105 09/30/17 0314 10/01/17 0459  WBC 42.2* 33.1* 16.0*  NEUTROABS 35.8* 27.5*  --   HGB 15.6* 13.9 12.4  HCT 45.2 40.6 37.1  MCV 86.3 84.8 86.1  PLT 297 257 188   Cardiac Enzymes: Recent Labs  Lab 09/29/17 2349 10/01/17 0459  CKTOTAL 9,459* 3,284*   BNP: BNP (last 3 results) No results for input(s): BNP in the last 8760 hours.  ProBNP (last 3 results) No results for input(s): PROBNP in the last 8760 hours.  CBG: Recent Labs  Lab 09/29/17 2217  GLUCAP 64*    Signed:  Penny PiaVEGA, Marybelle Giraldo MD.  [facility] 10/01/2017, 12:05 PM

## 2017-10-01 NOTE — Progress Notes (Signed)
ANTICOAGULATION CONSULT NOTE - Follow up Pharmacy Consult for Warfarin  Indication: Hx PE  No Known Allergies   Vital Signs: Temp: 98 F (36.7 C) (11/11 0548) Temp Source: Oral (11/11 0548) BP: 126/98 (11/11 0548) Pulse Rate: 78 (11/11 0548)  Labs: Recent Labs    09/29/17 2105 09/29/17 2349 09/30/17 0314 10/01/17 0459  HGB 15.6*  --  13.9 12.4  HCT 45.2  --  40.6 37.1  PLT 297  --  257 188  APTT  --  35  --   --   LABPROT  --  34.9* 40.5* 21.9*  INR  --  3.51 4.24* 1.93  CREATININE 2.50*  --  1.28* 0.68  CKTOTAL  --  9,459*  --  3,284*    Estimated Creatinine Clearance: 110.3 mL/min (by C-G formula based on SCr of 0.68 mg/dL).   Medical History: Past Medical History:  Diagnosis Date  . Anxiety   . Arthritis   . Bipolar disorder (HCC)   . Chronic pain   . Depression   . Diabetes mellitus without complication (HCC)   . Hypertension   . Lupus   . Neuromuscular disorder (HCC)   . 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 Osteomyelitis (HCC)   . 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 Pleurisy   . Polysubstance abuse (HCC)   . Pulmonary embolism (HCC) 2008   seen at [facility]--right lung  . Seizures [facility](HCC)     Assessment: 51 y/o F here with suspected drug overdose, on warfarin PTA for hx PE. INR 3.51 on admission, rising to 4.24 but down to 1.93 today. Will plan to resume warfarin tonight as INR now below goal range. CBC within normal limits. No bleeding noted.    Goal of Therapy:  INR 2-3 Monitor platelets by anticoagulation protocol: Yes   Plan:  Warfarin 5mg  PO x 1 dose Daily INR/ CBC   Thank you for allowing pharmacy to be part of this patients care team.  Blake DivineShannon Mitchelle Sultan, Pharm.D. PGY1 Pharmacy Resident 10/01/2017 10:00 AM Main Pharmacy: [phone]

## 2017-10-02 NOTE — Consult Note (Signed)
           [facility] Primary [facility] Navigator  10/02/2017  Thurmond ButtsJennifer L Nuzzo [birth year] [id number]   Attempt to seepatient at the bedsideto identify possible discharge needs but she was alreadydischargedper staff report.  Patient was discharged homeyesterday (weekend).  Primary care provider's office ([facility] Eloisa NorthernSaad [facility] called and confirmed that patient is currently seen in that practice. Spoke with Lurena JoinerRebecca to notify of patient's discharge and need for post hospital follow-up and transition of care. Notified of health issues needing follow-up as well.  Made aware to refer [facility] to [facility] care management if deemed necessary or appropriate for any services.   For questions, please contact:  Wyatt HasteLorraine Annaleigh Steinmeyer, BSN, RN- [facility] Primary [facility] Navigator  Telephone: [phone] [facility]

## 2017-10-05 LAB — CULTURE, BLOOD (ROUTINE X 2)
Culture: NO GROWTH
Culture: NO GROWTH
Special Requests: ADEQUATE
Special Requests: ADEQUATE

## 2017-10-24 DIAGNOSIS — I2782 Chronic pulmonary embolism: Secondary | ICD-10-CM | POA: Diagnosis not present

## 2017-11-07 DIAGNOSIS — M25571 Pain in right ankle and joints of right foot: Secondary | ICD-10-CM | POA: Diagnosis not present

## 2017-11-07 DIAGNOSIS — Z720 Tobacco use: Secondary | ICD-10-CM | POA: Diagnosis not present

## 2017-11-07 DIAGNOSIS — G894 Chronic pain syndrome: Secondary | ICD-10-CM | POA: Diagnosis not present

## 2017-11-07 DIAGNOSIS — Z79891 Long term (current) use of opiate analgesic: Secondary | ICD-10-CM | POA: Diagnosis not present

## 2017-11-25 DIAGNOSIS — I2782 Chronic pulmonary embolism: Secondary | ICD-10-CM | POA: Diagnosis not present

## 2017-12-06 DIAGNOSIS — E785 Hyperlipidemia, unspecified: Secondary | ICD-10-CM | POA: Diagnosis not present

## 2017-12-06 DIAGNOSIS — I10 Essential (primary) hypertension: Secondary | ICD-10-CM | POA: Diagnosis not present

## 2017-12-06 DIAGNOSIS — Z7901 Long term (current) use of anticoagulants: Secondary | ICD-10-CM | POA: Diagnosis not present

## 2017-12-07 DIAGNOSIS — M25571 Pain in right ankle and joints of right foot: Secondary | ICD-10-CM | POA: Diagnosis not present

## 2017-12-07 DIAGNOSIS — G894 Chronic pain syndrome: Secondary | ICD-10-CM | POA: Diagnosis not present

## 2017-12-07 DIAGNOSIS — Z79891 Long term (current) use of opiate analgesic: Secondary | ICD-10-CM | POA: Diagnosis not present

## 2017-12-07 DIAGNOSIS — Z720 Tobacco use: Secondary | ICD-10-CM | POA: Diagnosis not present

## 2018-01-09 DIAGNOSIS — Z79891 Long term (current) use of opiate analgesic: Secondary | ICD-10-CM | POA: Diagnosis not present

## 2018-01-09 DIAGNOSIS — G894 Chronic pain syndrome: Secondary | ICD-10-CM | POA: Diagnosis not present

## 2018-01-09 DIAGNOSIS — M25571 Pain in right ankle and joints of right foot: Secondary | ICD-10-CM | POA: Diagnosis not present

## 2018-01-09 DIAGNOSIS — Z720 Tobacco use: Secondary | ICD-10-CM | POA: Diagnosis not present

## 2018-01-11 DIAGNOSIS — I2782 Chronic pulmonary embolism: Secondary | ICD-10-CM | POA: Diagnosis not present

## 2018-01-23 DIAGNOSIS — Z5181 Encounter for therapeutic drug level monitoring: Secondary | ICD-10-CM | POA: Diagnosis not present

## 2018-01-23 DIAGNOSIS — Z79899 Other long term (current) drug therapy: Secondary | ICD-10-CM | POA: Diagnosis not present

## 2018-01-23 DIAGNOSIS — G8929 Other chronic pain: Secondary | ICD-10-CM | POA: Diagnosis not present

## 2018-01-23 DIAGNOSIS — M25579 Pain in unspecified ankle and joints of unspecified foot: Secondary | ICD-10-CM | POA: Diagnosis not present

## 2018-02-14 DIAGNOSIS — I2782 Chronic pulmonary embolism: Secondary | ICD-10-CM | POA: Diagnosis not present

## 2018-02-27 DIAGNOSIS — Z79899 Other long term (current) drug therapy: Secondary | ICD-10-CM | POA: Diagnosis not present

## 2018-02-27 DIAGNOSIS — Z5181 Encounter for therapeutic drug level monitoring: Secondary | ICD-10-CM | POA: Diagnosis not present

## 2018-02-27 DIAGNOSIS — M25579 Pain in unspecified ankle and joints of unspecified foot: Secondary | ICD-10-CM | POA: Diagnosis not present

## 2018-02-27 DIAGNOSIS — G8929 Other chronic pain: Secondary | ICD-10-CM | POA: Diagnosis not present

## 2018-03-13 DIAGNOSIS — I2782 Chronic pulmonary embolism: Secondary | ICD-10-CM | POA: Diagnosis not present

## 2018-03-30 DIAGNOSIS — Z5181 Encounter for therapeutic drug level monitoring: Secondary | ICD-10-CM | POA: Diagnosis not present

## 2018-03-30 DIAGNOSIS — G8929 Other chronic pain: Secondary | ICD-10-CM | POA: Diagnosis not present

## 2018-03-30 DIAGNOSIS — I10 Essential (primary) hypertension: Secondary | ICD-10-CM | POA: Diagnosis not present

## 2018-03-30 DIAGNOSIS — M25579 Pain in unspecified ankle and joints of unspecified foot: Secondary | ICD-10-CM | POA: Diagnosis not present

## 2018-04-13 DIAGNOSIS — I2782 Chronic pulmonary embolism: Secondary | ICD-10-CM | POA: Diagnosis not present

## 2018-05-02 DIAGNOSIS — M25572 Pain in left ankle and joints of left foot: Secondary | ICD-10-CM | POA: Diagnosis not present

## 2018-05-02 DIAGNOSIS — M25571 Pain in right ankle and joints of right foot: Secondary | ICD-10-CM | POA: Diagnosis not present

## 2018-05-02 DIAGNOSIS — Z7901 Long term (current) use of anticoagulants: Secondary | ICD-10-CM | POA: Diagnosis not present

## 2018-05-02 DIAGNOSIS — I10 Essential (primary) hypertension: Secondary | ICD-10-CM | POA: Diagnosis not present

## 2018-05-02 DIAGNOSIS — E785 Hyperlipidemia, unspecified: Secondary | ICD-10-CM | POA: Diagnosis not present

## 2018-05-02 DIAGNOSIS — G8929 Other chronic pain: Secondary | ICD-10-CM | POA: Diagnosis not present

## 2018-05-02 DIAGNOSIS — Z5181 Encounter for therapeutic drug level monitoring: Secondary | ICD-10-CM | POA: Diagnosis not present

## 2018-05-09 DIAGNOSIS — I2782 Chronic pulmonary embolism: Secondary | ICD-10-CM | POA: Diagnosis not present

## 2018-05-30 DIAGNOSIS — M25571 Pain in right ankle and joints of right foot: Secondary | ICD-10-CM | POA: Diagnosis not present

## 2018-05-30 DIAGNOSIS — G8929 Other chronic pain: Secondary | ICD-10-CM | POA: Diagnosis not present

## 2018-05-30 DIAGNOSIS — Z5181 Encounter for therapeutic drug level monitoring: Secondary | ICD-10-CM | POA: Diagnosis not present

## 2018-05-30 DIAGNOSIS — I10 Essential (primary) hypertension: Secondary | ICD-10-CM | POA: Diagnosis not present

## 2018-05-30 DIAGNOSIS — Z7901 Long term (current) use of anticoagulants: Secondary | ICD-10-CM | POA: Diagnosis not present

## 2018-06-08 DIAGNOSIS — I2782 Chronic pulmonary embolism: Secondary | ICD-10-CM | POA: Diagnosis not present

## 2018-07-02 DIAGNOSIS — G8929 Other chronic pain: Secondary | ICD-10-CM | POA: Diagnosis not present

## 2018-07-02 DIAGNOSIS — M25572 Pain in left ankle and joints of left foot: Secondary | ICD-10-CM | POA: Diagnosis not present

## 2018-07-02 DIAGNOSIS — Z5181 Encounter for therapeutic drug level monitoring: Secondary | ICD-10-CM | POA: Diagnosis not present

## 2018-07-02 DIAGNOSIS — M25571 Pain in right ankle and joints of right foot: Secondary | ICD-10-CM | POA: Diagnosis not present

## 2018-07-02 DIAGNOSIS — I10 Essential (primary) hypertension: Secondary | ICD-10-CM | POA: Diagnosis not present

## 2018-07-02 DIAGNOSIS — E785 Hyperlipidemia, unspecified: Secondary | ICD-10-CM | POA: Diagnosis not present

## 2018-07-11 DIAGNOSIS — I2782 Chronic pulmonary embolism: Secondary | ICD-10-CM | POA: Diagnosis not present

## 2018-08-02 DIAGNOSIS — Z5181 Encounter for therapeutic drug level monitoring: Secondary | ICD-10-CM | POA: Diagnosis not present

## 2018-08-02 DIAGNOSIS — I10 Essential (primary) hypertension: Secondary | ICD-10-CM | POA: Diagnosis not present

## 2018-08-02 DIAGNOSIS — G8929 Other chronic pain: Secondary | ICD-10-CM | POA: Diagnosis not present

## 2018-08-02 DIAGNOSIS — M25572 Pain in left ankle and joints of left foot: Secondary | ICD-10-CM | POA: Diagnosis not present

## 2018-08-02 DIAGNOSIS — M25571 Pain in right ankle and joints of right foot: Secondary | ICD-10-CM | POA: Diagnosis not present

## 2018-08-02 DIAGNOSIS — E785 Hyperlipidemia, unspecified: Secondary | ICD-10-CM | POA: Diagnosis not present

## 2018-08-08 DIAGNOSIS — I2782 Chronic pulmonary embolism: Secondary | ICD-10-CM | POA: Diagnosis not present

## 2018-08-31 DIAGNOSIS — Z23 Encounter for immunization: Secondary | ICD-10-CM | POA: Diagnosis not present

## 2018-08-31 DIAGNOSIS — M25571 Pain in right ankle and joints of right foot: Secondary | ICD-10-CM | POA: Diagnosis not present

## 2018-08-31 DIAGNOSIS — Z5181 Encounter for therapeutic drug level monitoring: Secondary | ICD-10-CM | POA: Diagnosis not present

## 2018-08-31 DIAGNOSIS — I10 Essential (primary) hypertension: Secondary | ICD-10-CM | POA: Diagnosis not present

## 2018-08-31 DIAGNOSIS — E785 Hyperlipidemia, unspecified: Secondary | ICD-10-CM | POA: Diagnosis not present

## 2018-08-31 DIAGNOSIS — G8929 Other chronic pain: Secondary | ICD-10-CM | POA: Diagnosis not present

## 2018-09-05 DIAGNOSIS — I2782 Chronic pulmonary embolism: Secondary | ICD-10-CM | POA: Diagnosis not present

## 2018-10-02 DIAGNOSIS — Z5181 Encounter for therapeutic drug level monitoring: Secondary | ICD-10-CM | POA: Diagnosis not present

## 2018-10-02 DIAGNOSIS — Z79899 Other long term (current) drug therapy: Secondary | ICD-10-CM | POA: Diagnosis not present

## 2018-10-02 DIAGNOSIS — G894 Chronic pain syndrome: Secondary | ICD-10-CM | POA: Diagnosis not present

## 2018-10-04 DIAGNOSIS — I2782 Chronic pulmonary embolism: Secondary | ICD-10-CM | POA: Diagnosis not present

## 2018-10-31 DIAGNOSIS — I2782 Chronic pulmonary embolism: Secondary | ICD-10-CM | POA: Diagnosis not present

## 2018-11-02 DIAGNOSIS — G8929 Other chronic pain: Secondary | ICD-10-CM | POA: Diagnosis not present

## 2018-11-02 DIAGNOSIS — M25571 Pain in right ankle and joints of right foot: Secondary | ICD-10-CM | POA: Diagnosis not present

## 2018-11-02 DIAGNOSIS — Z5181 Encounter for therapeutic drug level monitoring: Secondary | ICD-10-CM | POA: Diagnosis not present

## 2018-11-02 DIAGNOSIS — G4700 Insomnia, unspecified: Secondary | ICD-10-CM | POA: Diagnosis not present

## 2018-11-02 DIAGNOSIS — M25572 Pain in left ankle and joints of left foot: Secondary | ICD-10-CM | POA: Diagnosis not present

## 2018-12-03 DIAGNOSIS — Z5181 Encounter for therapeutic drug level monitoring: Secondary | ICD-10-CM | POA: Diagnosis not present

## 2018-12-03 DIAGNOSIS — G8929 Other chronic pain: Secondary | ICD-10-CM | POA: Diagnosis not present

## 2018-12-03 DIAGNOSIS — M25572 Pain in left ankle and joints of left foot: Secondary | ICD-10-CM | POA: Diagnosis not present

## 2018-12-03 DIAGNOSIS — I10 Essential (primary) hypertension: Secondary | ICD-10-CM | POA: Diagnosis not present

## 2018-12-03 DIAGNOSIS — E785 Hyperlipidemia, unspecified: Secondary | ICD-10-CM | POA: Diagnosis not present

## 2018-12-03 DIAGNOSIS — M25571 Pain in right ankle and joints of right foot: Secondary | ICD-10-CM | POA: Diagnosis not present

## 2018-12-03 DIAGNOSIS — I2782 Chronic pulmonary embolism: Secondary | ICD-10-CM | POA: Diagnosis not present

## 2018-12-28 DIAGNOSIS — G8929 Other chronic pain: Secondary | ICD-10-CM | POA: Diagnosis not present

## 2018-12-28 DIAGNOSIS — E785 Hyperlipidemia, unspecified: Secondary | ICD-10-CM | POA: Diagnosis not present

## 2018-12-28 DIAGNOSIS — I10 Essential (primary) hypertension: Secondary | ICD-10-CM | POA: Diagnosis not present

## 2018-12-28 DIAGNOSIS — M25572 Pain in left ankle and joints of left foot: Secondary | ICD-10-CM | POA: Diagnosis not present

## 2018-12-28 DIAGNOSIS — Z5181 Encounter for therapeutic drug level monitoring: Secondary | ICD-10-CM | POA: Diagnosis not present

## 2018-12-28 DIAGNOSIS — M25571 Pain in right ankle and joints of right foot: Secondary | ICD-10-CM | POA: Diagnosis not present

## 2019-01-02 DIAGNOSIS — I2782 Chronic pulmonary embolism: Secondary | ICD-10-CM | POA: Diagnosis not present

## 2019-01-29 DIAGNOSIS — M25571 Pain in right ankle and joints of right foot: Secondary | ICD-10-CM | POA: Diagnosis not present

## 2019-01-29 DIAGNOSIS — Z5181 Encounter for therapeutic drug level monitoring: Secondary | ICD-10-CM | POA: Diagnosis not present

## 2019-01-29 DIAGNOSIS — G8929 Other chronic pain: Secondary | ICD-10-CM | POA: Diagnosis not present

## 2019-01-29 DIAGNOSIS — M25572 Pain in left ankle and joints of left foot: Secondary | ICD-10-CM | POA: Diagnosis not present

## 2019-01-29 DIAGNOSIS — I10 Essential (primary) hypertension: Secondary | ICD-10-CM | POA: Diagnosis not present

## 2019-02-02 DIAGNOSIS — I2782 Chronic pulmonary embolism: Secondary | ICD-10-CM | POA: Diagnosis not present

## 2019-03-03 DIAGNOSIS — I2782 Chronic pulmonary embolism: Secondary | ICD-10-CM | POA: Diagnosis not present

## 2019-03-11 DIAGNOSIS — G8929 Other chronic pain: Secondary | ICD-10-CM | POA: Diagnosis not present

## 2019-03-11 DIAGNOSIS — M25571 Pain in right ankle and joints of right foot: Secondary | ICD-10-CM | POA: Diagnosis not present

## 2019-03-11 DIAGNOSIS — Z5181 Encounter for therapeutic drug level monitoring: Secondary | ICD-10-CM | POA: Diagnosis not present

## 2019-03-11 DIAGNOSIS — I10 Essential (primary) hypertension: Secondary | ICD-10-CM | POA: Diagnosis not present

## 2019-03-11 DIAGNOSIS — Z7901 Long term (current) use of anticoagulants: Secondary | ICD-10-CM | POA: Diagnosis not present

## 2019-03-11 DIAGNOSIS — M25572 Pain in left ankle and joints of left foot: Secondary | ICD-10-CM | POA: Diagnosis not present

## 2019-04-03 DIAGNOSIS — I2782 Chronic pulmonary embolism: Secondary | ICD-10-CM | POA: Diagnosis not present

## 2019-04-10 DIAGNOSIS — Z5181 Encounter for therapeutic drug level monitoring: Secondary | ICD-10-CM | POA: Diagnosis not present

## 2019-04-10 DIAGNOSIS — Z7901 Long term (current) use of anticoagulants: Secondary | ICD-10-CM | POA: Diagnosis not present

## 2019-04-10 DIAGNOSIS — I10 Essential (primary) hypertension: Secondary | ICD-10-CM | POA: Diagnosis not present

## 2019-04-10 DIAGNOSIS — M25521 Pain in right elbow: Secondary | ICD-10-CM | POA: Diagnosis not present

## 2019-04-10 DIAGNOSIS — G8929 Other chronic pain: Secondary | ICD-10-CM | POA: Diagnosis not present

## 2019-05-05 DIAGNOSIS — I2782 Chronic pulmonary embolism: Secondary | ICD-10-CM | POA: Diagnosis not present

## 2019-05-13 DIAGNOSIS — I10 Essential (primary) hypertension: Secondary | ICD-10-CM | POA: Diagnosis not present

## 2019-05-13 DIAGNOSIS — G8929 Other chronic pain: Secondary | ICD-10-CM | POA: Diagnosis not present

## 2019-05-13 DIAGNOSIS — M25572 Pain in left ankle and joints of left foot: Secondary | ICD-10-CM | POA: Diagnosis not present

## 2019-05-13 DIAGNOSIS — Z5181 Encounter for therapeutic drug level monitoring: Secondary | ICD-10-CM | POA: Diagnosis not present

## 2019-05-13 DIAGNOSIS — M25571 Pain in right ankle and joints of right foot: Secondary | ICD-10-CM | POA: Diagnosis not present

## 2019-06-05 DIAGNOSIS — I2782 Chronic pulmonary embolism: Secondary | ICD-10-CM | POA: Diagnosis not present

## 2019-06-12 DIAGNOSIS — I10 Essential (primary) hypertension: Secondary | ICD-10-CM | POA: Diagnosis not present

## 2019-06-12 DIAGNOSIS — M25572 Pain in left ankle and joints of left foot: Secondary | ICD-10-CM | POA: Diagnosis not present

## 2019-06-12 DIAGNOSIS — M25571 Pain in right ankle and joints of right foot: Secondary | ICD-10-CM | POA: Diagnosis not present

## 2019-06-12 DIAGNOSIS — G8929 Other chronic pain: Secondary | ICD-10-CM | POA: Diagnosis not present

## 2019-07-10 DIAGNOSIS — I2782 Chronic pulmonary embolism: Secondary | ICD-10-CM | POA: Diagnosis not present

## 2019-07-12 DIAGNOSIS — M25571 Pain in right ankle and joints of right foot: Secondary | ICD-10-CM | POA: Diagnosis not present

## 2019-07-12 DIAGNOSIS — M25572 Pain in left ankle and joints of left foot: Secondary | ICD-10-CM | POA: Diagnosis not present

## 2019-07-12 DIAGNOSIS — I10 Essential (primary) hypertension: Secondary | ICD-10-CM | POA: Diagnosis not present

## 2019-07-12 DIAGNOSIS — G8929 Other chronic pain: Secondary | ICD-10-CM | POA: Diagnosis not present

## 2019-07-12 DIAGNOSIS — Z7901 Long term (current) use of anticoagulants: Secondary | ICD-10-CM | POA: Diagnosis not present

## 2019-11-07 DIAGNOSIS — I2782 Chronic pulmonary embolism: Secondary | ICD-10-CM | POA: Diagnosis not present

## 2019-11-13 DIAGNOSIS — G8929 Other chronic pain: Secondary | ICD-10-CM | POA: Diagnosis not present

## 2019-11-13 DIAGNOSIS — M25572 Pain in left ankle and joints of left foot: Secondary | ICD-10-CM | POA: Diagnosis not present

## 2019-11-13 DIAGNOSIS — Z7901 Long term (current) use of anticoagulants: Secondary | ICD-10-CM | POA: Diagnosis not present

## 2019-11-13 DIAGNOSIS — M25571 Pain in right ankle and joints of right foot: Secondary | ICD-10-CM | POA: Diagnosis not present

## 2019-11-13 DIAGNOSIS — Z5181 Encounter for therapeutic drug level monitoring: Secondary | ICD-10-CM | POA: Diagnosis not present

## 2019-12-16 DIAGNOSIS — M25571 Pain in right ankle and joints of right foot: Secondary | ICD-10-CM | POA: Diagnosis not present

## 2019-12-16 DIAGNOSIS — Z7901 Long term (current) use of anticoagulants: Secondary | ICD-10-CM | POA: Diagnosis not present

## 2019-12-16 DIAGNOSIS — Z5181 Encounter for therapeutic drug level monitoring: Secondary | ICD-10-CM | POA: Diagnosis not present

## 2019-12-16 DIAGNOSIS — M25572 Pain in left ankle and joints of left foot: Secondary | ICD-10-CM | POA: Diagnosis not present

## 2019-12-16 DIAGNOSIS — G8929 Other chronic pain: Secondary | ICD-10-CM | POA: Diagnosis not present

## 2019-12-17 DIAGNOSIS — I2782 Chronic pulmonary embolism: Secondary | ICD-10-CM | POA: Diagnosis not present
